# Patient Record
Sex: Female | Born: 1945 | Race: White | Hispanic: No | Marital: Married | Smoking: Former smoker
Health system: Western US, Academic
[De-identification: ages and names within clinical notes are randomized; demographics above are authoritative.]

## PROBLEM LIST (undated history)

## (undated) MED ORDER — GABAPENTIN 300 MG OR CAPS
300.00 mg | ORAL_CAPSULE | Freq: Three times a day (TID) | ORAL | 0 refills | Status: AC
Start: 2019-09-14 — End: ?

## (undated) MED ORDER — ALENDRONATE SODIUM 70 MG OR TABS
70.00 mg | ORAL_TABLET | ORAL | 0 refills | Status: AC
Start: 2019-09-14 — End: ?

## (undated) MED ORDER — GABAPENTIN 300 MG OR CAPS
300.00 mg | ORAL_CAPSULE | Freq: Three times a day (TID) | ORAL | 0 refills | Status: AC
Start: 2021-05-08 — End: ?

## (undated) MED ORDER — GABAPENTIN 300 MG OR CAPS
300.00 mg | ORAL_CAPSULE | Freq: Three times a day (TID) | ORAL | 0 refills | Status: AC
Start: 2021-05-04 — End: ?

## (undated) MED ORDER — ALENDRONATE SODIUM 70 MG OR TABS
ORAL_TABLET | ORAL | 0 refills | Status: AC
Start: 2019-11-23 — End: ?

## (undated) MED ORDER — ALENDRONATE SODIUM 70 MG OR TABS
ORAL_TABLET | ORAL | 0 refills | Status: AC
Start: 2016-10-08 — End: ?

## (undated) MED ORDER — DENOSUMAB 60 MG/ML SC SOSY
60.0000 mg | PREFILLED_SYRINGE | Freq: Once | SUBCUTANEOUS | Status: AC
Start: 2023-02-12 — End: 2023-02-12

## (undated) MED ORDER — GABAPENTIN 300 MG OR CAPS
300.00 mg | ORAL_CAPSULE | Freq: Three times a day (TID) | ORAL | 0 refills | Status: AC
Start: 2021-02-22 — End: ?

## (undated) MED ORDER — ALENDRONATE SODIUM 70 MG OR TABS
ORAL_TABLET | ORAL | 0 refills | Status: AC
Start: 2019-11-26 — End: ?

## (undated) MED ORDER — GABAPENTIN 300 MG OR CAPS
300.0000 mg | ORAL_CAPSULE | Freq: Three times a day (TID) | ORAL | 0 refills | Status: AC
Start: 2021-07-27 — End: ?

## (undated) MED ORDER — ALENDRONATE SODIUM 70 MG OR TABS
ORAL_TABLET | ORAL | 0 refills | Status: AC
Start: 2019-05-04 — End: ?

## (undated) MED ORDER — VITAMIN B-12 1000 MCG OR TABS
1000.00 ug | ORAL_TABLET | Freq: Every day | ORAL | 0 refills | Status: AC
Start: 2019-07-13 — End: ?

## (undated) MED ORDER — GABAPENTIN 300 MG OR CAPS
ORAL_CAPSULE | ORAL | 0 refills | Status: AC
Start: 2020-01-06 — End: ?

## (undated) MED ORDER — ALENDRONATE SODIUM 70 MG OR TABS
70.0000 mg | ORAL_TABLET | ORAL | 1 refills | Status: AC
Start: 2020-07-28 — End: ?

## (undated) MED ORDER — ALENDRONATE SODIUM 70 MG OR TABS
70.0000 mg | ORAL_TABLET | ORAL | 0 refills | Status: AC
Start: 2020-07-28 — End: ?

## (undated) MED ORDER — ALENDRONATE SODIUM 70 MG OR TABS
ORAL_TABLET | ORAL | 0 refills | Status: AC
Start: 2019-11-30 — End: ?

## (undated) MED ORDER — VITAMIN B-12 1000 MCG OR TABS
1000.00 ug | ORAL_TABLET | Freq: Every day | ORAL | 0 refills | Status: AC
Start: 2019-07-06 — End: ?

---

## 2008-10-11 ENCOUNTER — Ambulatory Visit: Payer: Self-pay | Admitting: Gynecology

## 2008-11-03 ENCOUNTER — Ambulatory Visit: Payer: Self-pay | Admitting: Gynecology

## 2008-11-11 LAB — PATHOLOGY TISSUE EXAM - ~~LOC~~

## 2008-11-11 LAB — HISTORICAL HISTOLOGY DATA

## 2008-11-22 ENCOUNTER — Ambulatory Visit: Payer: Self-pay | Admitting: Gynecology

## 2008-12-20 ENCOUNTER — Ambulatory Visit: Payer: Self-pay | Admitting: Gynecology

## 2014-02-03 ENCOUNTER — Ambulatory Visit: Payer: Self-pay

## 2014-02-09 ENCOUNTER — Ambulatory Visit: Payer: Self-pay

## 2014-02-11 ENCOUNTER — Ambulatory Visit: Payer: Self-pay

## 2014-02-12 ENCOUNTER — Ambulatory Visit: Payer: Self-pay

## 2014-02-15 ENCOUNTER — Ambulatory Visit: Payer: Self-pay

## 2014-02-16 ENCOUNTER — Ambulatory Visit: Payer: Self-pay

## 2014-02-17 ENCOUNTER — Ambulatory Visit: Payer: Self-pay

## 2014-02-24 ENCOUNTER — Ambulatory Visit: Payer: Self-pay

## 2014-03-03 ENCOUNTER — Ambulatory Visit: Payer: Self-pay

## 2014-03-08 ENCOUNTER — Ambulatory Visit: Payer: Self-pay

## 2014-03-16 ENCOUNTER — Ambulatory Visit: Payer: Self-pay

## 2014-03-29 ENCOUNTER — Ambulatory Visit: Payer: Self-pay

## 2014-03-31 ENCOUNTER — Ambulatory Visit: Payer: Self-pay

## 2014-04-14 ENCOUNTER — Ambulatory Visit: Payer: Self-pay

## 2014-04-21 ENCOUNTER — Ambulatory Visit: Payer: Self-pay

## 2014-05-12 ENCOUNTER — Ambulatory Visit: Payer: Self-pay

## 2014-06-02 ENCOUNTER — Ambulatory Visit: Payer: Self-pay

## 2014-07-14 ENCOUNTER — Ambulatory Visit: Payer: Self-pay

## 2014-08-04 ENCOUNTER — Ambulatory Visit: Payer: Self-pay | Admitting: Hematology & Oncology

## 2014-08-25 ENCOUNTER — Ambulatory Visit: Payer: Self-pay | Admitting: Hematology & Oncology

## 2014-09-02 ENCOUNTER — Ambulatory Visit: Payer: Self-pay | Admitting: Hematology & Oncology

## 2014-09-02 LAB — CBC WITH DIFF, BLOOD
Basophil: 0 10*3/uL (ref 0–0.2)
Eosinophil: 0 10*3/uL (ref 0–0.5)
Hematocrit: 33.4 % — ABNORMAL LOW (ref 34.0–44.0)
Hgb: 10.7 G/DL — ABNORMAL LOW (ref 11.5–15.0)
Lymphocyte: 1.8 10*3/uL (ref 0.9–3.3)
MCH: 30.1 PG (ref 27.0–33.5)
MCHC: 32 G/DL (ref 32.0–35.5)
MCV: 93.8 FL (ref 81.5–97.0)
Monocyte: 0.5 10*3/uL (ref 0–0.8)
Neutrophils: 4.4 10*3/uL (ref 2.0–8.1)
PLT Count: 204 10*3/uL (ref 150–400)
RBC: 3.56 10*6/uL — ABNORMAL LOW (ref 3.70–5.00)
RDW-CV: 13.1 % (ref 11.6–14.4)
White Bld Cell Count: 6.7 10*3/uL (ref 4.0–10.5)

## 2014-09-02 LAB — COMPREHENSIVE METABOLIC PANEL, BLOOD
ALT: 11 U/L (ref 7–52)
AST: 16 U/L (ref 13–39)
Albumin: 4 G/DL (ref 3.7–5.3)
Alk Phos: 94 U/L (ref 34–104)
BUN: 18 mg/dL (ref 7–25)
Bilirubin, Total: 1.1 mg/dL — ABNORMAL HIGH (ref 0.3–1.0)
CO2: 21 mmol/L (ref 21–31)
Calcium: 9.2 mg/dL (ref 8.6–10.3)
Chloride: 108 mmol/L — ABNORMAL HIGH (ref 98–107)
Creat: 1.1 mg/dL (ref 0.6–1.2)
Electrolyte Balance: 6 mmol/L (ref 2–12)
Glucose: 145 mg/dL — ABNORMAL HIGH (ref 85–125)
Potassium: 4.5 mmol/L (ref 3.5–5.1)
Protein, Total: 6.7 G/DL (ref 6.0–8.3)
Sodium: 135 mmol/L — ABNORMAL LOW (ref 136–145)

## 2014-09-02 LAB — LABCUM (HISTORIC)

## 2014-09-02 LAB — MAGNESIUM, BLOOD: Magnesium: 1.6 mg/dL — ABNORMAL LOW (ref 1.9–2.7)

## 2014-09-02 LAB — PHOSPHORUS, BLOOD: Phosphorus: 3.1 MG/DL (ref 2.5–5.0)

## 2014-09-06 LAB — VITAMIN D, 25-OH TOTAL: Vitamin D, 25-OH, Total: 36 ng/mL (ref 30–100)

## 2014-09-06 LAB — ~~LOC~~ 27.29, BLOOD: ~~LOC~~ 27.29: 16 U/mL (ref <38)

## 2014-10-18 ENCOUNTER — Ambulatory Visit: Payer: Self-pay | Admitting: Hematology & Oncology

## 2014-10-30 LAB — URINALYSIS WITH CULTURE REFLEX, WHEN INDICATED
Bilrubin: NEGATIVE
Glucose: NEGATIVE
Hyaline Casts: NONE SEEN /LPF
Ketones: NEGATIVE
Nitrite: NEGATIVE
Protein, Urine: NEGATIVE
Specific Gravity: 1.006 (ref 1.001–1.035)
Squam EpithelialCells: NONE SEEN /HPF (ref <=5)
WBC: >=60 /HPF — AB (ref <=5)
pH: 5 (ref 5.0–8.0)

## 2014-10-30 LAB — REFLEXIVE URINE CULTURE-QUEST

## 2014-10-30 LAB — URINE CULTURE

## 2014-12-17 ENCOUNTER — Ambulatory Visit: Payer: Self-pay | Admitting: Hematology & Oncology

## 2014-12-17 LAB — COMPREHENSIVE METABOLIC PANEL, BLOOD
ALT: 13 U/L (ref 7–52)
AST: 15 U/L (ref 13–39)
Albumin: 3.9 G/DL (ref 3.7–5.3)
Alk Phos: 114 U/L — ABNORMAL HIGH (ref 34–104)
BUN: 21 mg/dL (ref 7–25)
Bilirubin, Total: 0.7 mg/dL (ref 0.3–1.0)
CO2: 21 mmol/L (ref 21–31)
Calcium: 9 mg/dL (ref 8.6–10.3)
Chloride: 107 mmol/L (ref 98–107)
Creat: 1.3 mg/dL — ABNORMAL HIGH (ref 0.6–1.2)
Electrolyte Balance: 7 mmol/L (ref 2–12)
Glucose: 143 mg/dL — ABNORMAL HIGH (ref 85–125)
Potassium: 5.5 mmol/L — ABNORMAL HIGH (ref 3.5–5.1)
Protein, Total: 6.5 G/DL (ref 6.0–8.3)
Sodium: 135 mmol/L — ABNORMAL LOW (ref 136–145)

## 2014-12-17 LAB — CBC WITH DIFF, BLOOD
Basophil: 0 10*3/uL (ref 0–0.2)
Eosinophil: 0 10*3/uL (ref 0–0.5)
Hematocrit: 29.8 % — ABNORMAL LOW (ref 34.0–44.0)
Hgb: 9.2 G/DL — ABNORMAL LOW (ref 11.5–15.0)
Lymphocyte: 1.6 10*3/uL (ref 0.9–3.3)
MCH: 30 PG (ref 27.0–33.5)
MCHC: 30.9 G/DL — ABNORMAL LOW (ref 32.0–35.5)
MCV: 97.1 FL — ABNORMAL HIGH (ref 81.5–97.0)
Monocyte: 0.4 10*3/uL (ref 0–0.8)
Neutrophils: 5.1 10*3/uL (ref 2.0–8.1)
PLT Count: 239 10*3/uL (ref 150–400)
RBC: 3.07 10*6/uL — ABNORMAL LOW (ref 3.70–5.00)
RDW-CV: 12.1 % (ref 11.6–14.4)
White Bld Cell Count: 7.1 10*3/uL (ref 4.0–10.5)

## 2014-12-17 LAB — LABCUM (HISTORIC)

## 2014-12-20 LAB — VITAMIN D, 25-OH TOTAL: Vitamin D, 25-OH, Total: 37 ng/mL (ref 30–100)

## 2014-12-20 LAB — ~~LOC~~ 27.29, BLOOD: ~~LOC~~ 27.29: 13 U/mL (ref <38)

## 2014-12-29 ENCOUNTER — Ambulatory Visit: Payer: Self-pay | Admitting: Hematology & Oncology

## 2015-02-04 ENCOUNTER — Ambulatory Visit: Payer: Self-pay | Admitting: Hematology & Oncology

## 2015-05-23 LAB — COMPREHENSIVE METABOLIC PANEL, BLOOD
ALT (SGPT): 10 U/L (ref 6–29)
AST (SGOT): 12 U/L (ref 10–35)
Albumin/Glob Ratio: 1.4 (calc) (ref 1.0–2.5)
Albumin: 3.7 g/dL (ref 3.6–5.1)
Alkaline Phos: 137 U/L — ABNORMAL HIGH (ref 33–130)
BUN/Creatinine Ratio: 18 (calc) (ref 6–22)
BUN: 22 mg/dL (ref 7–25)
Bilirubin, Total: 0.8 mg/dL (ref 0.2–1.2)
Calcium: 9 mg/dL (ref 8.6–10.4)
Carbon Dioxide: 22 mmol/L (ref 19–30)
Chloride: 108 mmol/L (ref 98–110)
Creatinine: 1.19 mg/dL — ABNORMAL HIGH (ref 0.60–0.93)
Globulin: 2.6 g/dL (calc) (ref 1.9–3.7)
Glucose: 156 mg/dL — ABNORMAL HIGH (ref 65–99)
Potassium: 5 mmol/L (ref 3.5–5.3)
Sodium: 140 mmol/L (ref 135–146)
Total Protein: 6.3 g/dL (ref 6.1–8.1)
eGFR African American: 53 mL/min/{1.73_m2} — ABNORMAL LOW (ref >=60)
eGFR non-Afr.American: 46 mL/min/{1.73_m2} — ABNORMAL LOW (ref >=60)

## 2015-05-23 LAB — DIFFERENTIAL, MANUAL -QUEST
Abs Basophils: 0 cells/uL (ref 0–200)
Abs Eosinophils: 53 cells/uL (ref 15–500)
Abs Monocytes: 371 cells/uL (ref 200–950)
Abs Neutrophils: 2915 cells/uL (ref 1500–7800)
Absolute Lymphocytes: 1961 cells/uL (ref 850–3900)
Basophils: 0 %
Eosinophils: 1 %
Lymps: 37 %
Monocytes: 7 %
SEGS: 55 %

## 2015-05-23 LAB — CBC+DIFF/PLT W/SMEAR REVIEW-QUEST AND LABCORP
HCT: 31.4 % — ABNORMAL LOW (ref 35.0–45.0)
HGB: 10.1 g/dL — ABNORMAL LOW (ref 11.7–15.5)
MCH: 28.6 pg (ref 27.0–33.0)
MCHC: 32.1 g/dL (ref 32.0–36.0)
MCV: 89.1 fL (ref 80.0–100.0)
MPV: 7.5 fL (ref 7.5–11.5)
PLT: 224 10*3/uL (ref 140–400)
RBC: 3.53 10*6/uL — ABNORMAL LOW (ref 3.80–5.10)
RDW: 13.7 % (ref 11.0–15.0)
WBC: 5.3 10*3/uL (ref 3.8–10.8)

## 2015-05-23 LAB — ~~LOC~~ 27.29, BLOOD: ~~LOC~~ 27.29: 16 U/mL (ref <38)

## 2015-05-23 LAB — VITAMIN D, 25-OH TOTAL: Vitamin D, 25-OH, Total: 31 ng/mL (ref 30–100)

## 2015-05-24 ENCOUNTER — Ambulatory Visit: Payer: Self-pay | Admitting: Hematology & Oncology

## 2015-08-24 ENCOUNTER — Ambulatory Visit: Payer: Self-pay | Admitting: Hematology & Oncology

## 2015-08-25 LAB — CBC WITH DIFF, BLOOD
Abs Basophils: 12 cells/uL (ref 0–200)
Abs Eosinophils: 30 cells/uL (ref 15–500)
Abs Lymphs: 1741 cells/uL (ref 850–3900)
Abs Monocytes: 319 cells/uL (ref 200–950)
Abs Neutrophils: 3800 cells/uL (ref 1500–7800)
Basophils: 0.2 %
Eosinophils: 0.5 %
HCT: 31.1 % — ABNORMAL LOW (ref 35.0–45.0)
HGB: 9.8 g/dL — ABNORMAL LOW (ref 11.7–15.5)
Lymps: 29.5 %
MCH: 28.2 pg (ref 27.0–33.0)
MCHC: 31.6 g/dL — ABNORMAL LOW (ref 32.0–36.0)
MCV: 88.9 fL (ref 80.0–100.0)
MPV: 7.8 fL (ref 7.5–11.5)
Monocytes: 5.4 %
PLT: 257 10*3/uL (ref 140–400)
RBC: 3.5 10*6/uL — ABNORMAL LOW (ref 3.80–5.10)
RDW: 14.8 % (ref 11.0–15.0)
SEGS: 64.4 %
WBC: 5.9 10*3/uL (ref 3.8–10.8)

## 2015-08-25 LAB — TEST IN QUESTION - NO TEST FOR CONTAINER-QUEST

## 2015-08-25 LAB — COMPREHENSIVE METABOLIC PANEL, BLOOD
ALT (SGPT): 9 U/L (ref 6–29)
AST (SGOT): 16 U/L (ref 10–35)
Albumin/Glob Ratio: 1.5 (calc) (ref 1.0–2.5)
Albumin: 4 g/dL (ref 3.6–5.1)
Alkaline Phos: 136 U/L — ABNORMAL HIGH (ref 33–130)
BUN/Creatinine Ratio: 18 (calc) (ref 6–22)
BUN: 21 mg/dL (ref 7–25)
Bilirubin, Total: 0.9 mg/dL (ref 0.2–1.2)
Calcium: 8.9 mg/dL (ref 8.6–10.4)
Carbon Dioxide: 21 mmol/L (ref 20–31)
Chloride: 108 mmol/L (ref 98–110)
Creatinine: 1.19 mg/dL — ABNORMAL HIGH (ref 0.60–0.93)
Globulin: 2.6 g/dL (calc) (ref 1.9–3.7)
Glucose: 172 mg/dL — ABNORMAL HIGH (ref 65–99)
Potassium: 5.7 mmol/L — ABNORMAL HIGH (ref 3.5–5.3)
Sodium: 139 mmol/L (ref 135–146)
Total Protein: 6.6 g/dL (ref 6.1–8.1)
eGFR African American: 53 mL/min/{1.73_m2} — ABNORMAL LOW (ref >=60)
eGFR non-Afr.American: 46 mL/min/{1.73_m2} — ABNORMAL LOW (ref >=60)

## 2015-08-25 LAB — ~~LOC~~ 27.29, BLOOD: ~~LOC~~ 27.29: 8 U/mL (ref <38)

## 2015-11-19 LAB — COMPREHENSIVE METABOLIC PANEL, BLOOD
ALT (SGPT): 11 U/L (ref 6–29)
AST (SGOT): 15 U/L (ref 10–35)
Albumin/Glob Ratio: 1.6 (calc) (ref 1.0–2.5)
Albumin: 4.1 g/dL (ref 3.6–5.1)
Alkaline Phos: 126 U/L (ref 33–130)
BUN/Creatinine Ratio: 20 (calc) (ref 6–22)
BUN: 23 mg/dL (ref 7–25)
Bilirubin, Total: 0.8 mg/dL (ref 0.2–1.2)
Calcium: 8.7 mg/dL (ref 8.6–10.4)
Carbon Dioxide: 24 mmol/L (ref 20–31)
Chloride: 106 mmol/L (ref 98–110)
Creatinine: 1.16 mg/dL — ABNORMAL HIGH (ref 0.60–0.93)
Globulin: 2.6 g/dL (calc) (ref 1.9–3.7)
Glucose: 148 mg/dL — ABNORMAL HIGH (ref 65–99)
Potassium: 5.1 mmol/L (ref 3.5–5.3)
Sodium: 139 mmol/L (ref 135–146)
Total Protein: 6.7 g/dL (ref 6.1–8.1)
eGFR African American: 55 mL/min/{1.73_m2} — ABNORMAL LOW (ref >=60)
eGFR non-Afr.American: 47 mL/min/{1.73_m2} — ABNORMAL LOW (ref >=60)

## 2015-11-19 LAB — CBC WITH DIFF, BLOOD
Abs Basophils: 13 cells/uL (ref 0–200)
Abs Eosinophils: 32 cells/uL (ref 15–500)
Abs Lymphs: 1856 cells/uL (ref 850–3900)
Abs Monocytes: 403 cells/uL (ref 200–950)
Abs Neutrophils: 4096 cells/uL (ref 1500–7800)
Basophils: 0.2 %
Eosinophils: 0.5 %
HCT: 29 % — ABNORMAL LOW (ref 35.0–45.0)
HGB: 9.6 g/dL — ABNORMAL LOW (ref 11.7–15.5)
Lymps: 29 %
MCH: 28.4 pg (ref 27.0–33.0)
MCHC: 32.9 g/dL (ref 32.0–36.0)
MCV: 86.3 fL (ref 80.0–100.0)
MPV: 7.5 fL (ref 7.5–12.5)
Monocytes: 6.3 %
PLT: 224 10*3/uL (ref 140–400)
RBC: 3.36 10*6/uL — ABNORMAL LOW (ref 3.80–5.10)
RDW: 13.8 % (ref 11.0–15.0)
SEGS: 64 %
WBC: 6.4 10*3/uL (ref 3.8–10.8)

## 2015-11-19 LAB — ~~LOC~~ 27.29, BLOOD: ~~LOC~~ 27.29: 21 U/mL (ref <38)

## 2015-11-23 ENCOUNTER — Ambulatory Visit: Payer: Self-pay | Admitting: Hematology & Oncology

## 2016-02-18 LAB — VITAMIN B12, BLOOD: Vitamin B12: 191 pg/mL — ABNORMAL LOW (ref 200–1100)

## 2016-02-18 LAB — COMPREHENSIVE METABOLIC PANEL, BLOOD
ALT (SGPT): 15 U/L (ref 6–29)
AST (SGOT): 18 U/L (ref 10–35)
Albumin/Glob Ratio: 1.4 (calc) (ref 1.0–2.5)
Albumin: 3.9 g/dL (ref 3.6–5.1)
Alkaline Phos: 98 U/L (ref 33–130)
BUN/Creatinine Ratio: 20 (calc) (ref 6–22)
BUN: 30 mg/dL — ABNORMAL HIGH (ref 7–25)
Bilirubin, Total: 0.7 mg/dL (ref 0.2–1.2)
Calcium: 8.6 mg/dL (ref 8.6–10.4)
Carbon Dioxide: 25 mmol/L (ref 20–31)
Chloride: 104 mmol/L (ref 98–110)
Creatinine: 1.48 mg/dL — ABNORMAL HIGH (ref 0.60–0.93)
Globulin: 2.7 g/dL (calc) (ref 1.9–3.7)
Glucose: 166 mg/dL — ABNORMAL HIGH (ref 65–99)
Potassium: 4.9 mmol/L (ref 3.5–5.3)
Sodium: 138 mmol/L (ref 135–146)
Total Protein: 6.6 g/dL (ref 6.1–8.1)
eGFR African American: 41 mL/min/{1.73_m2} — ABNORMAL LOW (ref >=60)
eGFR non-Afr.American: 35 mL/min/{1.73_m2} — ABNORMAL LOW (ref >=60)

## 2016-02-18 LAB — RETICULOCYTES AUTOMATED, BLOOD
Retic %, Auto: 1.2 %
Retic Absolute: 39240 cells/uL (ref 20000–80000)

## 2016-02-18 LAB — CBC WITH DIFF, BLOOD
Abs Basophils: 10 cells/uL (ref 0–200)
Abs Eosinophils: 52 cells/uL (ref 15–500)
Abs Lymphs: 1648 cells/uL (ref 850–3900)
Abs Monocytes: 385 cells/uL (ref 200–950)
Abs NRBC: 0 cells/uL
Abs Neutrophils: 3104 cells/uL (ref 1500–7800)
Basophils: 0.2 %
Eosinophils: 1 %
HCT: 28.1 % — ABNORMAL LOW (ref 35.0–45.0)
HGB: 9.1 g/dL — ABNORMAL LOW (ref 11.7–15.5)
Lymps: 31.7 %
MCH: 27.8 pg (ref 27.0–33.0)
MCHC: 32.4 g/dL (ref 32.0–36.0)
MCV: 85.9 fL (ref 80.0–100.0)
MPV: 9.2 fL (ref 7.5–12.5)
Monocytes: 7.4 %
PLT: 218 10*3/uL (ref 140–400)
RBC: 3.27 10*6/uL — ABNORMAL LOW (ref 3.80–5.10)
RDW: 13.2 % (ref 11.0–15.0)
SEGS: 59.7 %
WBC: 5.2 10*3/uL (ref 3.8–10.8)

## 2016-02-18 LAB — FERRITIN, BLOOD: Ferritin: 7 ng/mL — ABNORMAL LOW (ref 20–288)

## 2016-02-18 LAB — IRON/IBC PANEL
IIBC: 470 mcg/dL (calc) — ABNORMAL HIGH (ref 250–450)
Iron Saturation: 6 % (calc) — ABNORMAL LOW (ref 11–50)
Iron: 30 ug/dL — ABNORMAL LOW (ref 45–160)

## 2016-02-18 LAB — VITAMIN D, 25-OH TOTAL: Vitamin D, 25-OH, Total: 32 ng/mL (ref 30–100)

## 2016-02-18 LAB — ~~LOC~~ 27.29, BLOOD: ~~LOC~~ 27.29: 18 U/mL (ref <38)

## 2016-02-22 ENCOUNTER — Ambulatory Visit: Payer: Self-pay | Admitting: Specialist

## 2016-02-22 ENCOUNTER — Ambulatory Visit: Payer: Self-pay | Admitting: Hematology & Oncology

## 2016-02-29 ENCOUNTER — Ambulatory Visit: Payer: Self-pay | Admitting: Hematology & Oncology

## 2016-03-07 ENCOUNTER — Ambulatory Visit: Payer: Self-pay | Admitting: Hematology & Oncology

## 2016-03-14 ENCOUNTER — Ambulatory Visit: Payer: Self-pay | Admitting: Hematology & Oncology

## 2016-06-15 LAB — CBC WITH DIFF, BLOOD
Abs Basophils: 20 cells/uL (ref 0–200)
Abs Eosinophils: 231 cells/uL (ref 15–500)
Abs Lymphs: 2171 cells/uL (ref 850–3900)
Abs Monocytes: 548 cells/uL (ref 200–950)
Abs NRBC: 0 cells/uL
Abs Neutrophils: 3630 cells/uL (ref 1500–7800)
Basophils: 0.3 %
Eosinophils: 3.5 %
HCT: 32.4 % — ABNORMAL LOW (ref 35.0–45.0)
HGB: 10.5 g/dL — ABNORMAL LOW (ref 11.7–15.5)
Lymps: 32.9 %
MCH: 28.3 pg (ref 27.0–33.0)
MCHC: 32.4 g/dL (ref 32.0–36.0)
MCV: 87.3 fL (ref 80.0–100.0)
MPV: 9.4 fL (ref 7.5–12.5)
Monocytes: 8.3 %
PLT: 201 10*3/uL (ref 140–400)
RBC: 3.71 10*6/uL — ABNORMAL LOW (ref 3.80–5.10)
RDW: 14.4 % (ref 11.0–15.0)
SEGS: 55 %
WBC: 6.6 10*3/uL (ref 3.8–10.8)

## 2016-06-15 LAB — COMPREHENSIVE METABOLIC PANEL, BLOOD
ALT (SGPT): 8 U/L (ref 6–29)
AST (SGOT): 12 U/L (ref 10–35)
Albumin/Glob Ratio: 1.5 (calc) (ref 1.0–2.5)
Albumin: 4.1 g/dL (ref 3.6–5.1)
Alkaline Phos: 116 U/L (ref 33–130)
BUN/Creatinine Ratio: 19 (calc) (ref 6–22)
BUN: 29 mg/dL — ABNORMAL HIGH (ref 7–25)
Bilirubin, Total: 0.6 mg/dL (ref 0.2–1.2)
Calcium: 8.8 mg/dL (ref 8.6–10.4)
Carbon Dioxide: 25 mmol/L (ref 20–31)
Chloride: 101 mmol/L (ref 98–110)
Creatinine: 1.53 mg/dL — ABNORMAL HIGH (ref 0.60–0.93)
Globulin: 2.8 g/dL (calc) (ref 1.9–3.7)
Glucose: 226 mg/dL — ABNORMAL HIGH (ref 65–99)
Potassium: 4.7 mmol/L (ref 3.5–5.3)
Sodium: 137 mmol/L (ref 135–146)
Total Protein: 6.9 g/dL (ref 6.1–8.1)
eGFR African American: 39 mL/min/{1.73_m2} — ABNORMAL LOW (ref >=60)
eGFR non-Afr.American: 34 mL/min/{1.73_m2} — ABNORMAL LOW (ref >=60)

## 2016-06-15 LAB — FERRITIN, BLOOD: Ferritin: 11 ng/mL — ABNORMAL LOW (ref 20–288)

## 2016-06-15 LAB — VITAMIN D, 25-OH TOTAL: Vitamin D, 25-OH, Total: 26 ng/mL — ABNORMAL LOW (ref 30–100)

## 2016-06-15 LAB — VITAMIN B12, BLOOD: Vitamin B12: 677 pg/mL (ref 200–1100)

## 2016-06-15 LAB — IRON/IBC PANEL
IIBC: 422 mcg/dL (calc) (ref 250–450)
Iron Saturation: 14 % (calc) (ref 11–50)
Iron: 60 ug/dL (ref 45–160)

## 2016-06-15 LAB — ~~LOC~~ 27.29, BLOOD: ~~LOC~~ 27.29: 19 U/mL (ref <38)

## 2016-06-22 ENCOUNTER — Ambulatory Visit: Payer: Self-pay | Admitting: Hematology & Oncology

## 2016-08-31 ENCOUNTER — Other Ambulatory Visit: Payer: Self-pay | Admitting: Hematology & Oncology

## 2016-08-31 DIAGNOSIS — Z171 Estrogen receptor negative status [ER-]: Secondary | ICD-10-CM

## 2016-09-03 MED ORDER — ALENDRONATE SODIUM 70 MG OR TABS
ORAL_TABLET | ORAL | 0 refills | Status: DC
Start: 2016-09-03 — End: 2016-10-18

## 2016-10-06 ENCOUNTER — Other Ambulatory Visit: Payer: Self-pay | Admitting: Hematology & Oncology

## 2016-10-08 ENCOUNTER — Other Ambulatory Visit: Payer: Self-pay | Admitting: Nurse Practitioner

## 2016-10-08 DIAGNOSIS — C50919 Malignant neoplasm of unspecified site of unspecified female breast: Secondary | ICD-10-CM

## 2016-10-08 MED ORDER — VITAMIN B-12 1000 MCG OR TABS
ORAL_TABLET | ORAL | 0 refills | Status: DC
Start: 2016-10-08 — End: 2016-10-16

## 2016-10-16 ENCOUNTER — Other Ambulatory Visit: Payer: Self-pay | Admitting: Nurse Practitioner

## 2016-10-16 DIAGNOSIS — E538 Deficiency of other specified B group vitamins: Secondary | ICD-10-CM

## 2016-10-18 ENCOUNTER — Other Ambulatory Visit: Payer: Self-pay

## 2016-10-18 DIAGNOSIS — M810 Age-related osteoporosis without current pathological fracture: Secondary | ICD-10-CM

## 2016-10-18 DIAGNOSIS — I82629 Acute embolism and thrombosis of deep veins of unspecified upper extremity: Secondary | ICD-10-CM | POA: Insufficient documentation

## 2016-10-18 DIAGNOSIS — N183 Chronic kidney disease, stage 3 unspecified (CMS-HCC): Secondary | ICD-10-CM | POA: Insufficient documentation

## 2016-10-18 DIAGNOSIS — E1140 Type 2 diabetes mellitus with diabetic neuropathy, unspecified: Secondary | ICD-10-CM | POA: Insufficient documentation

## 2016-10-18 DIAGNOSIS — D509 Iron deficiency anemia, unspecified: Secondary | ICD-10-CM | POA: Insufficient documentation

## 2016-10-18 DIAGNOSIS — C439 Malignant melanoma of skin, unspecified: Secondary | ICD-10-CM | POA: Insufficient documentation

## 2016-10-18 DIAGNOSIS — Z171 Estrogen receptor negative status [ER-]: Secondary | ICD-10-CM

## 2016-10-18 DIAGNOSIS — E538 Deficiency of other specified B group vitamins: Secondary | ICD-10-CM | POA: Insufficient documentation

## 2016-10-18 DIAGNOSIS — R931 Abnormal findings on diagnostic imaging of heart and coronary circulation: Secondary | ICD-10-CM | POA: Insufficient documentation

## 2016-10-18 DIAGNOSIS — E669 Obesity, unspecified: Secondary | ICD-10-CM | POA: Insufficient documentation

## 2016-10-18 DIAGNOSIS — E119 Type 2 diabetes mellitus without complications: Secondary | ICD-10-CM | POA: Insufficient documentation

## 2016-10-18 HISTORY — DX: Age-related osteoporosis without current pathological fracture: M81.0

## 2016-10-18 MED ORDER — SIMVASTATIN 10 MG OR TABS
ORAL_TABLET | ORAL | Status: AC
Start: 2016-09-18 — End: ?

## 2016-10-18 MED ORDER — VENLAFAXINE HCL 75 MG OR CP24
ORAL_CAPSULE | ORAL | Status: AC
Start: 2016-09-29 — End: ?

## 2016-10-18 MED ORDER — SYNTHROID 100 MCG OR TABS
ORAL_TABLET | ORAL | Status: DC
Start: 2016-09-02 — End: 2018-06-17

## 2016-10-18 MED ORDER — TRULICITY 0.75 MG/0.5ML SC SOPN
PEN_INJECTOR | SUBCUTANEOUS | Status: DC
Start: 2016-10-11 — End: 2019-06-10

## 2016-10-18 MED ORDER — ALENDRONATE SODIUM 70 MG OR TABS
70.0000 mg | ORAL_TABLET | ORAL | 1 refills | Status: DC
Start: 2016-10-18 — End: 2016-12-13

## 2016-10-18 MED ORDER — INVOKANA 100 MG PO TABS
ORAL_TABLET | ORAL | Status: DC
Start: 2016-08-08 — End: 2017-06-04

## 2016-10-18 MED ORDER — METFORMIN HCL 1000 MG OR TABS
ORAL_TABLET | ORAL | Status: DC
Start: 2016-10-08 — End: 2017-12-03

## 2016-10-18 MED ORDER — CALCIUM CARBONATE ANTACID 1000 MG OR CHEW: 100.00 mg | CHEWABLE_TABLET | ORAL | Status: AC

## 2016-10-18 MED ORDER — LISINOPRIL 10 MG OR TABS
5.00 mg | ORAL_TABLET | Freq: Every day | ORAL | Status: DC
Start: 2016-09-21 — End: 2020-12-20

## 2016-10-18 NOTE — Telephone Encounter (Signed)
Received fax from [facility] requesting refill on Fosamax. Rx prompted and forwarded to Dr. Dorris Singh.

## 2016-10-25 MED ORDER — VITAMIN B-12 1000 MCG OR TABS
ORAL_TABLET | ORAL | 0 refills | Status: DC
Start: 2016-10-25 — End: 2016-12-16

## 2016-11-12 ENCOUNTER — Other Ambulatory Visit: Payer: Self-pay | Admitting: Nurse Practitioner

## 2016-11-12 DIAGNOSIS — D519 Vitamin B12 deficiency anemia, unspecified: Secondary | ICD-10-CM

## 2016-11-12 MED ORDER — VITAMIN B-12 1000 MCG OR TABS
ORAL_TABLET | ORAL | 0 refills | Status: DC
Start: 2016-11-12 — End: 2017-01-13

## 2016-11-23 ENCOUNTER — Ambulatory Visit: Payer: Medicare Other | Admitting: Hematology & Oncology

## 2016-11-23 ENCOUNTER — Ambulatory Visit: Payer: Self-pay | Admitting: Hematology & Oncology

## 2016-12-04 ENCOUNTER — Ambulatory Visit (INDEPENDENT_AMBULATORY_CARE_PROVIDER_SITE_OTHER): Payer: Medicare Other | Admitting: Hematology & Oncology

## 2016-12-04 ENCOUNTER — Encounter: Payer: Self-pay | Admitting: Hematology & Oncology

## 2016-12-04 VITALS — BP 120/74 | HR 98 | Temp 97.8°F | Resp 16 | Ht 65.0 in | Wt 203.9 lb

## 2016-12-04 DIAGNOSIS — C4361 Malignant melanoma of right upper limb, including shoulder: Secondary | ICD-10-CM

## 2016-12-04 DIAGNOSIS — D508 Other iron deficiency anemias: Secondary | ICD-10-CM

## 2016-12-04 DIAGNOSIS — N183 Chronic kidney disease, stage 3 unspecified (CMS-HCC): Secondary | ICD-10-CM

## 2016-12-04 DIAGNOSIS — Z171 Estrogen receptor negative status [ER-]: Secondary | ICD-10-CM

## 2016-12-04 DIAGNOSIS — Z923 Personal history of irradiation: Secondary | ICD-10-CM

## 2016-12-04 DIAGNOSIS — C50911 Malignant neoplasm of unspecified site of right female breast: Principal | ICD-10-CM

## 2016-12-04 DIAGNOSIS — Z780 Asymptomatic menopausal state: Secondary | ICD-10-CM

## 2016-12-04 DIAGNOSIS — E538 Deficiency of other specified B group vitamins: Secondary | ICD-10-CM

## 2016-12-04 DIAGNOSIS — M8589 Other specified disorders of bone density and structure, multiple sites: Secondary | ICD-10-CM

## 2016-12-04 DIAGNOSIS — M810 Age-related osteoporosis without current pathological fracture: Secondary | ICD-10-CM

## 2016-12-04 MED ORDER — IRON PO
ORAL | Status: DC
Start: ? — End: 2022-06-11

## 2016-12-04 MED ORDER — VITAMIN D-3 PO: Freq: Every day | ORAL | Status: AC

## 2016-12-04 NOTE — Patient Instructions (Signed)
Bonnita Hollow, MD    Specialty  DDermatology    [address] [place] [address]     Phone:   [phone]   Fax:   [phone]

## 2016-12-04 NOTE — Progress Notes (Signed)
Kayla Faster, MD, MBA    Associate Clinical Professor  [facility]  [place]  [address], [address], [place], [place] [address]  Tel.: [phone] Fax: [phone]      Date of Service:  December 04, 2016    Kayla Joseph, Kayla Joseph December 31, 1945    REFERRING MD: Kayla Joseph  Radiation oncology: Kayla Joseph  PCP: Kayla Joseph  GYN: Kayla Joseph   Cardiology: Kayla Joseph    CHIEF COMPLAINT: Routine follow-up visit for right breast cancer and melanoma    HISTORY OF PRESENT ILLNESS:    71 y/o Caucasian woman with mammographically detected clinical stage IIA T2N0 right breast invasive ductal carcinoma s/p bx 01/14/14 (SBR 9/9, ER/PR negative, Ki-67=50%, Her-2/neu FISH ratio=1.14 but copy number=6.6 so positive). MRI breast 01/29/14 showed mass to be 2.2 x 1.6 x 2.0 cm. Staging PET/CT 01/27/14 was negative for distant mets; showed only known right breast tumor and incidental left thyroid nodule. Planned neoadjuvant systemic therapy: TCH + P x 6 cycles followed by surgery and radiation therapy. Cycle #1 TCH+P given 02/17/14. Pt developed severe diarrhea and dehydration causing electrolyte derangements requiring daily replacement therapies and hydration. Therefore, chemotherapy was changed to weekly Taxol for 12 weeks and Herceptin plus Perjeta every 3 weeks was continued. Taxol started 03/10/14 and completed only 8 weeks on 04/28/14 due to cumulative severe side effects and MRI breast 04/30/14 showing only 5m of residual enhancement. Herceptin/Perjeta continued q3wks for 6 total cycles. Right lumpectomy + SLNB done 05/28/14 (6 mm residual IDC, grade 2, SBR 7/9, no LI, 0/1 LN; ypT1bN0; residual tumor had lower a mitotic score compared to initial biopsy specimen likely due to chemotherapy effect; repeat ER and PR on residual tumor was again negative for both). Due to residual disease although minimal, pt received final dose (#6) of Perjeta  06/02/14. Herceptin single-agent q3wks started 06/23/14.     July 14, 2014 (Herceptin week #22)     Started single-agent Herceptin 06/23/14 and tolerated it well without side effects. Hair is starting to grow. Neuropathy in the fingers and bottoms of the feet continue but improve when she takes gabapentin 300 mg 3 times a day. She does not want to increase the dose since it is controlling her neuropathy and she does not want to increase fatigue due to medication side effect. [place] but it was improved with more solid form during [place]. The only difference during that trip was that her friends were cooking all of her meals for her so a change in diet seems to have improved the diarrhea. Since her return, her diarrhea has resumed unchanged. She has about 6 watery episodes of diarrhea per day and has had 3 episodes thus far this morning. She continues Percocet as needed but also has a prior prescription for more tincture of opium which she will change back to since postop pain has resolved. She has not yet done repeat stool studies since she was on her trip but will do so soon with prior order. She has not seen the gastroenterologist yet but will ask her primary care doctor for a referral to see one soon. Dysuria symptoms recurred this past weekend. Bactrim worked well in the past after the 1st dose. She saw Dr PMarshall Cork9/4/15 and will start xrt next week on 07/21/14. She receives hydration every 3 weeks with Herceptin but requests more frequent visits since her diarrhea is ongoing.  Labs (CBC, CMP) 06/02/14 sig for H/H=10.6/32.9; gluc=221.   The patient completed the Herceptin every 3 weeks for total of 1 year of adjuvant therapy in 12-2014 and remains off therapy.     INTERVAL HISTORY:    The patient reports no new breast lesions. She had a colonoscopy one week ago. She remains on vitamin-D and calcium. She is reporting no pains.  She also reports that she had a melanoma and reports no new skin lesions and need to follow up with dermatology but her dermatologist retired. She denies any bleeding.   She is also followed by cardiology.  She is diabetic and is trying to control it better. She denies any bleeding.     PAST MEDICAL HISTORY:     Reviewed and no changes  Stage IIA T2N0 right breast IDC --> ypT1bN0   LUE DVT dx'ed by U/S 02/10/14 -- Lovenox started same day; resolved on 05/18/14 U/S; completed Lovenox 06/07/14   Stage I pT1aN0 melanoma of upper back s/p excision  DM II  Hyperlipidemia  Hypertension  BCC skin  Thyroid nodules bilaterally       PAST SURGICAL HISTORY:     Reviewed and no changes   05/28/14 Right lumpectomy + SLNB   02/05/14 L. chest port placement ([facility] IR)  07/04/11 Wide excision upper back melanoma + R. supraclavicular SLNB (lentigo melanoma and melanoma in situ, 0.6m, no residual invasive melanoma, 0/1 LN)   2013 Facelift  2012 Gastric bypass  1999 Cholecystectomy  1995 Left foot fracture repair with pins  Age [age]Tonsillectomy     PAST OB/GYN HISTORY:     Reviewed and no changes   G0P0. Menarche at 71 OCP x 15 yrs; last age 71s HRT late 560sfor <73ms; stopped after [facility] study results reported. LMP age 71     HEALTH MAINTENANCE:    Colo -- 3-4 yrs ago; repeat due next year   DXA -- 2015 osteopenia; prescribed Fosamax but never started   Pap -- 11/2013 (one prior abnl pap and polyp removed)      FAMILY HISTORY:    Reviewed and no changes   Mother -- died 854f CHF  Father -- died 8773f decline after stroke  Sister -- died 4922f NHL; another sister alive and well     SOCIAL HISTORY:    Lives with partner, FrQuintella Joseph  Kids -- none   Retired [employer] developer  Tobacco -- only 4 yrs in early 20s, <1ppd  Alcohol  1 glass wine/mth at most   Drugs  none      CURRENT MEDS:   Please see below and I reviewed the medication list with the patient.    ALLERGIES:   No Known Allergies    REVIEW OF SYSTEMS (ROS):  A comprehensive 15-point ROS was performed and reviewed with the patient and is negative unless noted above.     EXAM:    GEN: The patient is well developed and well nourished, ambulatory, in no acute distress.  HEENT: normocephalic/atraumatic, anicteric sclera, there is no evidence for cervical or supraclavicular lymphadenopathy bilaterally.  HEART: S1, S2, regular rate and rhythm.  CHEST: Clear to auscultation bilaterally, good inspiratory effort, no crackles or rales or wheezing, no spine or flank tenderness to palpation.  AXILLAE: No palpable lesions bilaterally.  BREASTS: Were examined bilaterally, no new palpable breast lesions, no nipple discharge bilaterally.  ABDOMEN: soft, non-tender, non-distended, normal bowel sounds, no rebound or guarding, no appreciable  hepatosplenomegaly, no inguinal lymphadenopathy bilaterally.  EXTREMITIES: No edema of the lower extremities.  SKIN: no petechiae or rash, no new skin lesions and complete skin exam was not performed.  NEURO: AOx3, EOMI, no focal deficits, gait steady.  PSYCHIATRIC: Good insight adequate mood and affect.    LAB DATA:    Reviewed the lab work with patient:    11-26-2016 hemoglobin A1c percent, normal comprehensive metabolic panel except glucose 122, creatinine 1.25, normal liver function profile, hemoglobin 11.5, hematocrit 35.4.  B 12  is 559.  10/11/2016 she count 6.1, hemoglobin 10.8, hematocrit 32.7, platelet count of 227000 normal differential, ferritin 10, normal comprehensive metabolic panel except glucose 230, BUN 35, creatinine 1.56, calcium 8.5, hemoglobin A1c 11.1%.  Vitamin D 22 ng/mL.  June 14, 2016 WBC count 6.6, hemoglobin 10.5, hematocrit 32.4, platelet count of 201,000, normal differential, normal comprehensive metabolic panel except glucose 226, BUN 29, creatinine 1.53, normal liver function  profile, vitamin-D 26 ng/mL, ferritin 11, B12 is 677, [place] 27-29 is 19.  02/17/2016 WBC count 5.2, hemoglobin 9.1, hematocrit 28.1, platelet count of 218000, normal differential, normal comprehensive metabolic panel except glucose of 166, BUN 30, creatinine 1.48, normal liver function profile, absolute retic count 39,240. Ferritin 7, B12 is 191. [place] 27-29 is 18. The vitamin-D is 32 ng/mL.  11/18/2015 WBC count 6.4, hemoglobin 9.6, hematocrit 29, platelet count of 224000, normal differential, normal comprehensive metabolic panel except glucose 148, creatinine 1.16, normal liver function profile, [place] 27-29 is 21.  August 03, 2015 creatinine 1.31,TSH 4.54, vitamin-D 23 ng/mL.  May 20, 2015 WBC count 5.3, hemoglobin 10.1, hematocrit 31.4, platelets of 224000 normal diff, [place] 27-29 is 16, Vit D 31 ng/mL normal comprehensive metabolic panel except glucose 156, creatinine 1.19, alk-phos 137.  12/27/2014 vitamin-D 34 ng/ml.  12/23/2014 normal basic metabolic panel except calcium 7.8 and glucose 103.  White Blood Cell Count: 7.0 THOUS/MCL [06-02-14 10:35]  Hemoglobin: 10.6 G/DL [06-02-14 10:35]  Hematocrit: 32.9 % [06-02-14 10:35]  Platelet Count: 290 THOUS/MCL [06-02-14 10:35]  RBC: 3.33 MILL/MCL [06-02-14 10:35]  MCV: 98.8 FL [06-02-14 10:35]  MCH: 31.8 PG [06-02-14 10:35]  MCHC: 32.2 G/DL [06-02-14 10:35]  RDW-CV: 13.6 % [06-02-14 10:35]  Neutrophils: 3.9 THOUS/MCL [06-02-14 10:35]  Lymphocyte: 2.5 THOUS/MCL [06-02-14 10:35]  Monocyte: 0.5 THOUS/MCL [06-02-14 10:35]  Eosinophil: 0.1 THOUS/MCL [06-02-14 10:35]  Basophil: 0.0 THOUS/MCL [06-02-14 10:35]  RBC Morphology: NO RBC ABNORMALITIES DETECTED BY AUTOMATED ANALYSIS. [06-02-14 10:35]  Plt Morph/Comm: DIFFERENTIAL PERFORMED BY AUTOMATED ANALYSIS. NO PLATELET ABNORMALITIES DETECTED BY AUTOMATED ANALYSIS. [06-02-14 10:35]  Sodium: 136 mmol/L [06-02-14 10:35]  Potassium: 4.2 mmol/L [06-02-14 10:35]  Chloride: 107 mmol/L [06-02-14 10:35]  CO2: 21 mmol/L [06-02-14 10:35]   Electrolyte Balance: 8 mmol/L [06-02-14 10:35]  BUN: 12 mg/dL [06-02-14 10:35]  Creatinine: 0.9 mg/dL [06-02-14 10:35]  Glucose: 221 mg/dL [06-02-14 10:35]  Calcium: 9.0 mg/dL [06-02-14 10:35]  Protein, Total: 6.2 G/DL [06-02-14 10:35]  Albumin: 3.7 G/DL [06-02-14 10:35]  Alkaline Phosphatase: 81 U/L [06-02-14 10:35]  Bilirubin, Total: 0.9 mg/dL [06-02-14 10:35]  AST: 22 U/L [06-02-14 10:35]  ALT: 16 U/L [06-02-14 10:35]No data available for the last 5 years    IMAGING DATA:    Reviewed the imaging with patient:    05/24/2016 bilateral diagnostic 2D and 3D mammogram shows stable post lumpectomy changes in the right breast are benign.  December 27, 2015 echocardiogram shows a normal ejection fraction.  05-24-2015 bilateral diagnostic mammogram and bilateral breast ultrasound showed new post lumpectomy scar in the right breast is benign,  stable punctate calcifications in the area areolar region of the right breast are benign, stable punctate calcifications in the left breast at 6 o'clock are benign with no evidence of malignancy.  11/22/2014 bilateral 2D and 3D mammogram shows new punctate calcifications in the periareolar region of the right breast are probably benign in mammographic follow-up in 6 months is recommended, new post lumpectomy scar in the right breast is benign. New punctate calcifications in the left breast at 6 o'clock up probably benign.  11/22/2014 bone density shows osteoporosis with a T-score of-2.6 in the right femoral neck.  August 31, 2014 echocardiogram shows left ventricular diastolic dysfunction, normal left ventricular size and systolic function.    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  09/27/2014 left thyroid nodule atypical follicular lesion of undetermined  significance, a right thyroid nodule fine-needle aspiration negative for malignancy.    IMPRESSION/PLAN:    Problem # 1: BREAST CANCER (ICD-174.9)    Initial clinical stage IIA pT2N0 right IDC s/p bx 01/14/14 (ER/PR negative, Her-2 positive by copy number = 6.6, Ki-67=50%). MRI breast 01/29/14 showed lesion to be 2.2 x 1.6 x 2.0 cm. Staging PET/CT negative for distant mets. Neoadjuvant chemotherapy with Her-2 targeted therapy was suggested, since tumor size > 2cm, so recommended TCH + P (Taxotere, Carboplatin, Herceptin, Perjeta) q3wks x 6 cycles. Systemic therapies started 02/17/14 but pt developed severe diarrhea causing dehydration and electrolyte abnormalities requiring daily hydration and electrolyte replacements so changed regimen to weekly Taxol x 12 weeks with Herceptin/Perjeta q3wks and tolerated only slightly better. Right breast mass resolved on physical exam. Pt was becoming weaker due to ongoing diarrhea on Taxol so discontinued after 04/28/14 dose with plan for repeat breast imaging followed by surgery if response was good. Breast imaging 04/28/14 (mammo, u/s) and MRI 04/30/14 showed minimal residual disease so Taxol was discontinued after 04/28/14 dose (week #8) and Herceptin/Perjeta continued. Right lumpectomy + SLNB 05/28/14 showed 6 mm residual disease, grade 2, SBR=7/9 (lower mitotic rate than bx), 0/1 LN; repeat ER/PR negative. Since only 5 doses of Perjeta were given pre-operatively and there was residual disease in surgical specimen, she was given the final 6th dose of Perjeta 06/02/14 with usual q3wk dose of Herceptin. Single-agent Herceptin continued q3wk and completed a 1-yr course in 12-2014 and since then no therapy. The radiation therapy was completed. Last mammogram reviewed and negative and next one planned for August of 2018 and an order was given. We discussed again signs and symptoms suggestive of recurrent disease. Clinically no evidence of any recurrent disease and I will continue to  monitor.    Problem # 2: ACUTE DVT OF UPPER EXTREMITY (ICD-453.82)    LUE acute DVT dx'ed by u/s 02/10/14 due to sudden onset of left arm swelling, completed anticoagulation and no evidence of recurrent thrombosis. May consider to remove PORT was concerned about chance to develop DVT but advised that removing the PORT does not usually cause a DVT.    Problem # 3: ABNORMAL ECHO (ICD-793.2)    Baseline ECHO 02/10/14 with normal EF of 65-70% but with mild diastolic dysfunction. I advised her to continue f/u with Dr. Reubin Milan for monitoring of cardiac function and recommended again a repeat ECHO.    Problem # 4: THYROID NODULE (ICD-241)     She had a total thyroidectomy and no malignancy found. Follow-up with  Endocrinology.    PROBLEM # 5 OSTEOPOROSIS    I advised her to discuss this also with her dentist, she will require ongoing dental monitoring, if okay with the dentist she continues on Fosamax at 70 mg weekly, we discussed the risks and benefits of this medication and the dentist is watching for osteonecrosis of the jaw but this is very rare with oral biphosphonate therapy and she reports no problems so far. Remains on Fosamax. TContinue Vit D and next bone density planned for February 2018.    PROBLEM # 6 MELANOMA    Still no pathology report and her old dermatologist retired and now she needs a new dermatologist and so the stage remains unknown, advised her again to follow-up with Dermatology, and also limit excessive sun exposure. She reports no new lesions.     PLAN:    1) Completed single-agent Herceptin q3wks to complete 51yrtotal course (06/23/14 thru 01/19/15). Repeat mammogram and ultrasound was negative that will be repeated yearly and remains off any therapy.Repeat mammogram 05-2017.  2) Anemia due to severe iron and B12 deficiency, was given B12 injections and monitor the iron profile and remains on oral iron. Her H/H is improving.  3) ECHO to be repeated as per Dr. RReubin Milanof cardiology.   4) Thyroidectomy done due to atypical left thyroid nodule and no malignancy, follow-up with Endocrinology.  5) Renal insufficiency and diabetes may contribute to anemia and advised her to stay well hydrated and follow-up with nephrology.  6) History of melanoma and she was referred to Dermatology.     Thank you very much for allowing our continued participation in the care of this patient.     More than 50% of this 40 min visit was spent in educating the patient about their condition, discussing compliance issues, counseling including answering all of the patients questions and coordination of care. This included review of relevant laboratory, radiology and other diagnostic tests, explaining medical management choices and the patient verbalized understanding. All risks, benefits, and alternatives were discussed in detail with the patient and the patient wished to proceed with the suggested treatment plan.     RTO: 4-6 months    I certify that I have reviewed the documentation contained in this clinical record and that it is accurately recorded. This document contains private and confidential health information protected by state and federal law and any release of this information requires the written prior authorization of the above mentioned patient.     Take a virtual tour of our facility: [email or web address]   Clinical trials: [email or web address]       --------------------------------------  ABarbaraann Faster MD, MBA

## 2016-12-13 ENCOUNTER — Other Ambulatory Visit: Payer: Self-pay | Admitting: Hematology & Oncology

## 2016-12-13 DIAGNOSIS — M810 Age-related osteoporosis without current pathological fracture: Secondary | ICD-10-CM

## 2016-12-13 MED ORDER — ALENDRONATE SODIUM 70 MG OR TABS
ORAL_TABLET | ORAL | 0 refills | Status: DC
Start: 2016-12-13 — End: 2017-01-14

## 2016-12-14 ENCOUNTER — Other Ambulatory Visit: Payer: Self-pay | Admitting: Hematology & Oncology

## 2016-12-14 DIAGNOSIS — M810 Age-related osteoporosis without current pathological fracture: Secondary | ICD-10-CM

## 2016-12-14 MED ORDER — ALENDRONATE SODIUM 70 MG OR TABS
ORAL_TABLET | ORAL | 0 refills | Status: DC
Start: 2016-12-14 — End: 2017-06-04

## 2016-12-16 ENCOUNTER — Other Ambulatory Visit: Payer: Self-pay | Admitting: Nurse Practitioner

## 2016-12-16 DIAGNOSIS — E538 Deficiency of other specified B group vitamins: Secondary | ICD-10-CM

## 2016-12-17 MED ORDER — VITAMIN B-12 1000 MCG OR TABS
ORAL_TABLET | ORAL | 0 refills | Status: DC
Start: 2016-12-17 — End: 2017-02-18

## 2016-12-22 ENCOUNTER — Other Ambulatory Visit: Payer: Self-pay | Admitting: Nurse Practitioner

## 2016-12-22 DIAGNOSIS — G629 Polyneuropathy, unspecified: Secondary | ICD-10-CM

## 2016-12-24 MED ORDER — GABAPENTIN 300 MG OR CAPS
ORAL_CAPSULE | ORAL | 2 refills | Status: DC
Start: 2016-12-24 — End: 2017-03-18

## 2017-01-13 ENCOUNTER — Other Ambulatory Visit: Payer: Self-pay | Admitting: Nurse Practitioner

## 2017-01-13 DIAGNOSIS — E538 Deficiency of other specified B group vitamins: Secondary | ICD-10-CM

## 2017-01-14 ENCOUNTER — Other Ambulatory Visit: Payer: Self-pay | Admitting: Hematology & Oncology

## 2017-01-14 DIAGNOSIS — M810 Age-related osteoporosis without current pathological fracture: Secondary | ICD-10-CM

## 2017-01-14 MED ORDER — ALENDRONATE SODIUM 70 MG OR TABS
ORAL_TABLET | ORAL | 0 refills | Status: DC
Start: 2017-01-14 — End: 2017-02-08

## 2017-01-14 MED ORDER — VITAMIN B-12 1000 MCG OR TABS
ORAL_TABLET | ORAL | 0 refills | Status: DC
Start: 2017-01-14 — End: 2017-03-19

## 2017-02-08 ENCOUNTER — Other Ambulatory Visit: Payer: Self-pay | Admitting: Hematology & Oncology

## 2017-02-08 DIAGNOSIS — M810 Age-related osteoporosis without current pathological fracture: Secondary | ICD-10-CM

## 2017-02-11 MED ORDER — ALENDRONATE SODIUM 70 MG OR TABS
ORAL_TABLET | ORAL | 0 refills | Status: DC
Start: 2017-02-11 — End: 2017-03-17

## 2017-02-15 ENCOUNTER — Other Ambulatory Visit: Payer: Self-pay | Admitting: Hematology & Oncology

## 2017-02-15 DIAGNOSIS — M810 Age-related osteoporosis without current pathological fracture: Secondary | ICD-10-CM

## 2017-02-18 ENCOUNTER — Other Ambulatory Visit: Payer: Self-pay | Admitting: Nurse Practitioner

## 2017-02-18 DIAGNOSIS — E538 Deficiency of other specified B group vitamins: Secondary | ICD-10-CM

## 2017-02-18 MED ORDER — ALENDRONATE SODIUM 70 MG OR TABS
ORAL_TABLET | ORAL | 0 refills | Status: DC
Start: 2017-02-18 — End: 2017-04-15

## 2017-02-20 MED ORDER — VITAMIN B-12 1000 MCG OR TABS
ORAL_TABLET | ORAL | 0 refills | Status: DC
Start: 2017-02-20 — End: 2017-06-04

## 2017-03-17 ENCOUNTER — Other Ambulatory Visit: Payer: Self-pay | Admitting: Hematology & Oncology

## 2017-03-17 DIAGNOSIS — M810 Age-related osteoporosis without current pathological fracture: Secondary | ICD-10-CM

## 2017-03-18 ENCOUNTER — Other Ambulatory Visit: Payer: Self-pay | Admitting: Nurse Practitioner

## 2017-03-18 DIAGNOSIS — G629 Polyneuropathy, unspecified: Secondary | ICD-10-CM

## 2017-03-19 ENCOUNTER — Other Ambulatory Visit: Payer: Self-pay | Admitting: Nurse Practitioner

## 2017-03-19 DIAGNOSIS — E538 Deficiency of other specified B group vitamins: Secondary | ICD-10-CM

## 2017-03-19 MED ORDER — ALENDRONATE SODIUM 70 MG OR TABS
ORAL_TABLET | ORAL | 0 refills | Status: DC
Start: 2017-03-19 — End: 2017-05-18

## 2017-03-20 MED ORDER — VITAMIN B-12 1000 MCG OR TABS
ORAL_TABLET | ORAL | 2 refills | Status: DC
Start: 2017-03-20 — End: 2017-07-15

## 2017-03-20 MED ORDER — GABAPENTIN 300 MG OR CAPS
ORAL_CAPSULE | ORAL | 1 refills | Status: DC
Start: 2017-03-20 — End: 2017-05-19

## 2017-04-15 ENCOUNTER — Other Ambulatory Visit: Payer: Self-pay | Admitting: Hematology & Oncology

## 2017-04-15 DIAGNOSIS — M810 Age-related osteoporosis without current pathological fracture: Secondary | ICD-10-CM

## 2017-04-15 MED ORDER — ALENDRONATE SODIUM 70 MG OR TABS
ORAL_TABLET | ORAL | 0 refills | Status: DC
Start: 2017-04-15 — End: 2017-06-04

## 2017-05-18 ENCOUNTER — Other Ambulatory Visit: Payer: Self-pay | Admitting: Hematology & Oncology

## 2017-05-18 DIAGNOSIS — M818 Other osteoporosis without current pathological fracture: Secondary | ICD-10-CM

## 2017-05-19 ENCOUNTER — Other Ambulatory Visit: Payer: Self-pay | Admitting: Nurse Practitioner

## 2017-05-19 DIAGNOSIS — G629 Polyneuropathy, unspecified: Secondary | ICD-10-CM

## 2017-05-20 ENCOUNTER — Encounter: Payer: Self-pay | Admitting: Nurse Practitioner

## 2017-05-20 MED ORDER — GABAPENTIN 300 MG OR CAPS
ORAL_CAPSULE | ORAL | 0 refills | Status: DC
Start: 2017-05-20 — End: 2017-06-15

## 2017-05-20 MED ORDER — ALENDRONATE SODIUM 70 MG OR TABS
ORAL_TABLET | ORAL | 0 refills | Status: DC
Start: 2017-05-20 — End: 2017-07-10

## 2017-05-28 ENCOUNTER — Other Ambulatory Visit: Payer: Self-pay | Admitting: Pulmonary Disease

## 2017-05-29 LAB — CBC WITH DIFF, BLOOD
Abs Basophils: 21 cells/uL (ref 0–200)
Abs Eosinophils: 48 cells/uL (ref 15–500)
Abs Lymphs: 1945 cells/uL (ref 850–3900)
Abs Monocytes: 387 cells/uL (ref 200–950)
Abs NRBC: 0 cells/uL
Abs Neutrophils: 2899 cells/uL (ref 1500–7800)
Basophils: 0.4 %
Eosinophils: 0.9 %
HCT: 33.3 % — ABNORMAL LOW (ref 35.0–45.0)
HGB: 11.5 g/dL — ABNORMAL LOW (ref 11.7–15.5)
Lymps: 36.7 %
MCH: 32.2 pg (ref 27.0–33.0)
MCHC: 34.5 g/dL (ref 32.0–36.0)
MCV: 93.3 fL (ref 80.0–100.0)
MPV: 9.5 fL (ref 7.5–12.5)
Monocytes: 7.3 %
PLT: 179 10*3/uL (ref 140–400)
RBC: 3.57 10*6/uL — ABNORMAL LOW (ref 3.80–5.10)
RDW: 11.9 % (ref 11.0–15.0)
SEGS: 54.7 %
WBC: 5.3 10*3/uL (ref 3.8–10.8)

## 2017-05-29 LAB — COMPREHENSIVE METABOLIC PANEL, BLOOD
ALT (SGPT): 9 U/L (ref 6–29)
AST (SGOT): 13 U/L (ref 10–35)
Albumin/Glob Ratio: 1.6 (calc) (ref 1.0–2.5)
Albumin: 4.1 g/dL (ref 3.6–5.1)
Alkaline Phos: 83 U/L (ref 33–130)
BUN/Creatinine Ratio: 17 (calc) (ref 6–22)
BUN: 21 mg/dL (ref 7–25)
Bilirubin, Total: 1 mg/dL (ref 0.2–1.2)
Calcium: 9.5 mg/dL (ref 8.6–10.4)
Carbon Dioxide: 26 mmol/L (ref 20–32)
Chloride: 107 mmol/L (ref 98–110)
Creatinine: 1.24 mg/dL — ABNORMAL HIGH (ref 0.60–0.93)
Globulin: 2.6 g/dL (calc) (ref 1.9–3.7)
Glucose: 103 mg/dL — ABNORMAL HIGH (ref 65–99)
Potassium: 5.4 mmol/L — ABNORMAL HIGH (ref 3.5–5.3)
Sodium: 138 mmol/L (ref 135–146)
Total Protein: 6.7 g/dL (ref 6.1–8.1)
eGFR African American: 50 mL/min/{1.73_m2} — ABNORMAL LOW (ref >=60)
eGFR non-Afr.American: 43 mL/min/{1.73_m2} — ABNORMAL LOW (ref >=60)

## 2017-05-29 LAB — COPY RECEIVED FROM-QUEST

## 2017-05-29 LAB — VITAMIN B12/FOLATE, SERUM PANEL-QUEST
Folate: 11.8 ng/mL
Vitamin B12: 611 pg/mL (ref 200–1100)

## 2017-05-29 LAB — LIPID(CHOL FRACT) PANEL, BLOOD
Chol/HDLC Ratio: 2.2 (calc) (ref <5.0)
Cholesterol: 112 mg/dL (ref <200)
HDL Cholesterol: 50 mg/dL — ABNORMAL LOW (ref >50)
LDL-Cholesterol: 41 mg/dL (calc)
Non-HDL Cholesterol: 62 mg/dL (calc) (ref <130)
Triglycerides: 129 mg/dL (ref <150)

## 2017-05-29 LAB — FERRITIN, BLOOD: Ferritin: 16 ng/mL — ABNORMAL LOW (ref 20–288)

## 2017-05-29 LAB — IRON/IBC PANEL
IIBC: 393 mcg/dL (calc) (ref 250–450)
Iron Saturation: 29 % (calc) (ref 11–50)
Iron: 115 ug/dL (ref 45–160)

## 2017-05-29 LAB — GLYCOSYLATED HGB(A1C), BLOOD: Hgb A1C: 6 % of total Hgb — ABNORMAL HIGH (ref <5.7)

## 2017-05-29 LAB — ~~LOC~~ 27.29, BLOOD: ~~LOC~~ 27.29: 14 U/mL (ref <38)

## 2017-05-29 LAB — VITAMIN D, 25-OH TOTAL: Vitamin D, 25-OH, Total: 40 ng/mL (ref 30–100)

## 2017-06-04 ENCOUNTER — Ambulatory Visit (INDEPENDENT_AMBULATORY_CARE_PROVIDER_SITE_OTHER): Payer: Medicare Other | Admitting: Hematology & Oncology

## 2017-06-04 ENCOUNTER — Encounter: Payer: Self-pay | Admitting: Hematology & Oncology

## 2017-06-04 VITALS — BP 123/72 | Temp 97.3°F | Resp 16 | Ht 65.0 in | Wt 183.0 lb

## 2017-06-04 DIAGNOSIS — Z171 Estrogen receptor negative status [ER-]: Secondary | ICD-10-CM

## 2017-06-04 DIAGNOSIS — M810 Age-related osteoporosis without current pathological fracture: Secondary | ICD-10-CM

## 2017-06-04 DIAGNOSIS — C50911 Malignant neoplasm of unspecified site of right female breast: Principal | ICD-10-CM

## 2017-06-04 NOTE — Progress Notes (Signed)
[name], MD, MBA    Associate Clinical Professor  [facility] Note  [place]  [address], [address], [place], [place] [address]  Tel.: [phone] Fax: [phone]      Date of Service:  06/04/2017    Kayla Joseph, [name] [birth year]    REFERRING MD: Bonney Leitz  Radiation oncology: Marshall Cork  PCP: [name]  GYN: [name]   Cardiology: Gypsy Decant    CHIEF COMPLAINT: Routine follow-up visit for right breast cancer and melanoma    HISTORY OF PRESENT ILLNESS:    71 y/o Caucasian woman with mammographically detected clinical stage IIA T2N0 right breast invasive ductal carcinoma s/p bx 01/14/14 (SBR 9/9, ER/PR negative, Ki-67=50%, Her-2/neu FISH ratio=1.14 but copy number=6.6 so positive). MRI breast 01/29/14 showed mass to be 2.2 x 1.6 x 2.0 cm. Staging PET/CT 01/27/14 was negative for distant mets; showed only known right breast tumor and incidental left thyroid nodule. Planned neoadjuvant systemic therapy: TCH + P x 6 cycles followed by surgery and radiation therapy. Cycle #1 TCH+P given 02/17/14. Pt developed severe diarrhea and dehydration causing electrolyte derangements requiring daily replacement therapies and hydration. Therefore, chemotherapy was changed to weekly Taxol for 12 weeks and Herceptin plus Perjeta every 3 weeks was continued. Taxol started 03/10/14 and completed only 8 weeks on 04/28/14 due to cumulative severe side effects and MRI breast 04/30/14 showing only 76m of residual enhancement. Herceptin/Perjeta continued q3wks for 6 total cycles. Right lumpectomy + SLNB done 05/28/14 (6 mm residual IDC, grade 2, SBR 7/9, no LI, 0/1 LN; ypT1bN0; residual tumor had lower a mitotic score compared to initial biopsy specimen likely due to chemotherapy effect; repeat ER and PR on residual tumor was again negative for both). Due to residual disease although minimal, pt received final dose (#6) of Perjeta  06/02/14. Herceptin single-agent q3wks started 06/23/14.     July 14, 2014 (Herceptin week #22)     Started single-agent Herceptin 06/23/14 and tolerated it well without side effects. Hair is starting to grow. Neuropathy in the fingers and bottoms of the feet continue but improve when she takes gabapentin 300 mg 3 times a day. She does not want to increase the dose since it is controlling her neuropathy and she does not want to increase fatigue due to medication side effect. She just returned from [place] trip to [place] Diarrhea continues but it was improved with more solid form during [place]. The only difference during that trip was that her friends were cooking all of her meals for her so a change in diet seems to have improved the diarrhea. Since her return, her diarrhea has resumed unchanged. She has about 6 watery episodes of diarrhea per day and has had 3 episodes thus far this morning. She continues Percocet as needed but also has a prior prescription for more tincture of opium which she will change back to since postop pain has resolved. She has not yet done repeat stool studies since she was on her trip but will do so soon with prior order. She has not seen the gastroenterologist yet but will ask her primary care doctor for a referral to see one soon. Dysuria symptoms recurred this past weekend. Bactrim worked well in the past after the 1st dose. She saw Dr PMarshall Cork9/4/15 and will start xrt next week on 07/21/14. She receives hydration every 3 weeks with Herceptin but requests more frequent visits since her diarrhea is ongoing. Labs (CBC,  CMP) 06/02/14 sig for H/H=10.6/32.9; gluc=221.   The patient completed the Herceptin every 3 weeks for total of 1 year of adjuvant therapy in 12-2014 and remains off therapy.     INTERVAL HISTORY:    The patient reports no new breast lesions. She had a colonoscopy earlier in to her was negative. She remains on vitamin-D and calcium. She is  reporting no pains. She also reports that she had a melanoma and reports no new skin lesions and need to follow up with dermatology and is planning to do that.  She denies any bleeding.   She is also followed by cardiology and gets periodic ECHO's.  She is diabetic and is trying to control it better. She denies any bleeding. She is taking oral iron.     PAST MEDICAL HISTORY:     Reviewed and no changes  Stage IIA T2N0 right breast IDC --> ypT1bN0   LUE DVT dx'ed by U/S 02/10/14 -- Lovenox started same day; resolved on 05/18/14 U/S; completed Lovenox 06/07/14   Stage I pT1aN0 melanoma of upper back s/p excision  DM II  Hyperlipidemia  Hypertension  BCC skin  Thyroid nodules bilaterally       PAST SURGICAL HISTORY:     Reviewed and no changes   05/28/14 Right lumpectomy + SLNB   02/05/14 L. chest port placement ([facility] IR)  07/04/11 Wide excision upper back melanoma + R. supraclavicular SLNB (lentigo melanoma and melanoma in situ, 0.19m, no residual invasive melanoma, 0/1 LN)   2013 Facelift  2012 Gastric bypass  1999 Cholecystectomy  1995 Left foot fracture repair with pins  Age [age]Tonsillectomy     PAST OB/GYN HISTORY:     Reviewed and no changes   G0P0. Menarche at 12 OCP x 15 yrs; last age 89s HRT late 71sfor <667ms; stopped after WHI study results reported. LMP age 71     HEALTH MAINTENANCE:    Colo -- 3-4 yrs ago; repeat due next year   DXA -- 2015 osteopenia; prescribed Fosamax but never started   Pap -- 11/2013 (one prior abnl pap and polyp removed)      FAMILY HISTORY:    Reviewed and no changes   Mother -- died 8565f CHF  Father -- died 879f decline after stroke  Sister -- died 4999f NHL; another sister alive and well     SOCIAL HISTORY:    Lives with partner, FrQuintella Reichert  Kids -- none   Retired [employer] developer  Tobacco -- only 4 yrs in early 20s, <1ppd  Alcohol  1 glass wine/mth at most   Drugs  none       CURRENT MEDS:  Please see below and I reviewed the medication list with the patient.    ALLERGIES:   No Known Allergies    REVIEW OF SYSTEMS (ROS):  A comprehensive 15-point ROS was performed and reviewed with the patient and is negative unless noted above.     EXAM:    GEN: The patient is well developed and well nourished, ambulatory, in no acute distress.  HEENT: normocephalic/atraumatic, anicteric sclera, there is no evidence for cervical or supraclavicular lymphadenopathy bilaterally.  HEART: S1, S2, regular rate and rhythm.  CHEST: Clear to auscultation bilaterally, good inspiratory effort, no crackles or rales or wheezing, no spine or flank tenderness to palpation.  AXILLAE: No palpable lesions bilaterally.  BREASTS: Were examined bilaterally, no new palpable breast lesions, no nipple discharge bilaterally.  ABDOMEN: soft, non-tender, non-distended, normal bowel sounds, no rebound or guarding, no appreciable hepatosplenomegaly, no inguinal lymphadenopathy bilaterally.  EXTREMITIES: No edema of the lower extremities.  SKIN: no petechiae or rash, no new skin lesions and complete skin exam was not performed.  NEURO: AOx3, EOMI, no focal deficits, gait steady.  PSYCHIATRIC: Good insight adequate mood and affect.    LAB DATA:    Reviewed the lab work with patient:    Results for LADINE, KIPER (MRN [id number]) as of 06/04/2017      Ref. Range 05/28/2017 09:30   Sodium Latest Ref Range: 135 - 146 mmol/L 138   Potassium Latest Ref Range: 3.5 - 5.3 mmol/L 5.4 (H)   Chloride Latest Ref Range: 98 - 110 mmol/L 107   Carbon Dioxide Latest Ref Range: 20 - 32 mmol/L 26   BUN Latest Ref Range: 7 - 25 mg/dL 21   Creatinine Latest Ref Range: 0.60 - 0.93 mg/dL 1.24 (H)   eGFR non-Afr.American Latest Ref Range: > OR = 60 mL/min/1.27m 43 (L)   eGFR African American Latest Ref Range: > OR = 60 mL/min/1.776m50 (L)   Glucose Latest Ref Range: 65 - 99 mg/dL 103 (H)   Calcium Latest Ref Range: 8.6 - 10.4 mg/dL 9.5    Total Protein Latest Ref Range: 6.1 - 8.1 g/dL 6.7   Alkaline Phos Latest Ref Range: 33 - 130 U/L 83   AST (SGOT) Latest Ref Range: 10 - 35 U/L 13   ALT (SGPT) Latest Ref Range: 6 - 29 U/L 9   Bilirubin, Total Latest Ref Range: 0.2 - 1.2 mg/dL 1.0   Albumin Latest Ref Range: 3.6 - 5.1 g/dL 4.1   BUN/Creatinine Ratio Latest Ref Range: 6 - 22 (calc) 17   [place] 27.29 Latest Ref Range: <38 U/mL 14   Cholesterol Latest Ref Range: <200 mg/dL 112   LDL-Cholesterol Latest Units: mg/dL (calc) 41   HDL Cholesterol Latest Ref Range: >50 mg/dL 50 (L)   Chol/HDLC Ratio Latest Ref Range: <5.0 (calc) 2.2   Non-HDL Cholesterol Latest Ref Range: <130 mg/dL (calc) 62   Ferritin Latest Ref Range: 20 - 288 ng/mL 16 (L)   Folate Latest Units: ng/mL 11.8   Globulin Latest Ref Range: 1.9 - 3.7 g/dL (calc) 2.6   Albumin/Glob Ratio Latest Ref Range: 1.0 - 2.5 (calc) 1.6   Hgb A1C Latest Ref Range: <5.7 % of total Hgb 6.0 (H)   Iron Latest Ref Range: 45 - 160 mcg/dL 115   IIBC Latest Ref Range: 250 - 450 mcg/dL (calc) 393   Iron Saturation Latest Ref Range: 11 - 50 % (calc) 29   Triglycerides Latest Ref Range: <150 mg/dL 129   Vitamin B12 Latest Ref Range: 200 - 1100 pg/mL 611   Vitamin D, 25-OH, Total Latest Ref Range: 30 - 100 ng/mL 40   WBC Latest Ref Range: 3.8 - 10.8 Thousand/uL 5.3   RBC Latest Ref Range: 3.80 - 5.10 Million/uL 3.57 (L)   HGB Latest Ref Range: 11.7 - 15.5 g/dL 11.5 (L)   HCT Latest Ref Range: 35.0 - 45.0 % 33.3 (L)   MCV Latest Ref Range: 80.0 - 100.0 fL 93.3   MCH Latest Ref Range: 27.0 - 33.0 pg 32.2   MCHC Latest Ref Range: 32.0 - 36.0 g/dL 34.5   RDW Latest Ref Range: 11.0 - 15.0 % 11.9   PLT Latest Ref Range: 140 - 400 Thousand/uL 179   MPV Latest Ref Range: 7.5 - 12.5 fL  9.5   SEGS Latest Units: % 54.7   Lymps Latest Units: % 36.7   Monocytes Latest Units: % 7.3   Eosinophils Latest Units: % 0.9   Basophils Latest Units: % 0.4   BANDS Latest Units: % CANCELED   Metamyelocytes Latest Units: % CANCELED    Myelocytes Latest Units: % CANCELED   Promyelocytes Latest Units: % CANCELED   Blasts Latest Units: % CANCELED   Reactive Lymphs Latest Ref Range: 0 - 10 % CANCELED   NRBC Latest Ref Range: 0 /100 WBC CANCELED   Abs Neutrophils Latest Ref Range: 1500 - 7800 cells/uL 2899   Abs Lymphs Latest Ref Range: 850 - 3900 cells/uL 1945   Abs Monocytes Latest Ref Range: 200 - 950 cells/uL 387   Abs Eosinophils Latest Ref Range: 15 - 500 cells/uL 48   Abs Basophils Latest Ref Range: 0 - 200 cells/uL 21   Abs Band Neutrophils Latest Ref Range: 0 - 750 cells/uL CANCELED   Abs Metamyelocytes Latest Ref Range: 0 cells/uL CANCELED   Abs Myelocytes Latest Ref Range: 0 cells/uL CANCELED   Abs Promyelocytes Latest Ref Range: 0 cells/uL CANCELED   Abs Blasts Latest Ref Range: 0 cells/uL CANCELED   Abs NRBC Latest Ref Range: 0 cells/uL 0   Comments Unknown CANCELED     11-26-2016 hemoglobin A1c percent, normal comprehensive metabolic panel except glucose 122, creatinine 1.25, normal liver function profile, hemoglobin 11.5, hematocrit 35.4.  B 12  is 559.  10/11/2016 she count 6.1, hemoglobin 10.8, hematocrit 32.7, platelet count of 227000 normal differential, ferritin 10, normal comprehensive metabolic panel except glucose 230, BUN 35, creatinine 1.56, calcium 8.5, hemoglobin A1c 11.1%.  Vitamin D 22 ng/mL.  June 14, 2016 WBC count 6.6, hemoglobin 10.5, hematocrit 32.4, platelet count of 201,000, normal differential, normal comprehensive metabolic panel except glucose 226, BUN 29, creatinine 1.53, normal liver function profile, vitamin-D 26 ng/mL, ferritin 11, B12 is 677, [place] 27-29 is 19.  02/17/2016 WBC count 5.2, hemoglobin 9.1, hematocrit 28.1, platelet count of 218000, normal differential, normal comprehensive metabolic panel except glucose of 166, BUN 30, creatinine 1.48, normal liver function profile, absolute retic count 39,240. Ferritin 7, B12 is 191. [place] 27-29 is 18. The vitamin-D is 32 ng/mL.   11/18/2015 WBC count 6.4, hemoglobin 9.6, hematocrit 29, platelet count of 224000, normal differential, normal comprehensive metabolic panel except glucose 148, creatinine 1.16, normal liver function profile, [place] 27-29 is 21.  August 03, 2015 creatinine 1.31,TSH 4.54, vitamin-D 23 ng/mL.  May 20, 2015 WBC count 5.3, hemoglobin 10.1, hematocrit 31.4, platelets of 224000 normal diff, [place] 27-29 is 16, Vit D 31 ng/mL normal comprehensive metabolic panel except glucose 156, creatinine 1.19, alk-phos 137.  12/27/2014 vitamin-D 34 ng/ml.  12/23/2014 normal basic metabolic panel except calcium 7.8 and glucose 103.  White Blood Cell Count: 7.0 THOUS/MCL [06-02-14 10:35]  Hemoglobin: 10.6 G/DL [06-02-14 10:35]  Hematocrit: 32.9 % [06-02-14 10:35]  Platelet Count: 290 THOUS/MCL [06-02-14 10:35]  RBC: 3.33 MILL/MCL [06-02-14 10:35]  MCV: 98.8 FL [06-02-14 10:35]  MCH: 31.8 PG [06-02-14 10:35]  MCHC: 32.2 G/DL [06-02-14 10:35]  RDW-CV: 13.6 % [06-02-14 10:35]  Neutrophils: 3.9 THOUS/MCL [06-02-14 10:35]  Lymphocyte: 2.5 THOUS/MCL [06-02-14 10:35]  Monocyte: 0.5 THOUS/MCL [06-02-14 10:35]  Eosinophil: 0.1 THOUS/MCL [06-02-14 10:35]  Basophil: 0.0 THOUS/MCL [06-02-14 10:35]  RBC Morphology: NO RBC ABNORMALITIES DETECTED BY AUTOMATED ANALYSIS. [06-02-14 10:35]  Plt Morph/Comm: DIFFERENTIAL PERFORMED BY AUTOMATED ANALYSIS. NO PLATELET ABNORMALITIES DETECTED BY AUTOMATED ANALYSIS. [06-02-14 10:35]  Sodium: 136 mmol/L [  06-02-14 10:35]  Potassium: 4.2 mmol/L [06-02-14 10:35]  Chloride: 107 mmol/L [06-02-14 10:35]  CO2: 21 mmol/L [06-02-14 10:35]  Electrolyte Balance: 8 mmol/L [06-02-14 10:35]  BUN: 12 mg/dL [06-02-14 10:35]  Creatinine: 0.9 mg/dL [06-02-14 10:35]  Glucose: 221 mg/dL [06-02-14 10:35]  Calcium: 9.0 mg/dL [06-02-14 10:35]  Protein, Total: 6.2 G/DL [06-02-14 10:35]  Albumin: 3.7 G/DL [06-02-14 10:35]  Alkaline Phosphatase: 81 U/L [06-02-14 10:35]  Bilirubin, Total: 0.9 mg/dL [06-02-14 10:35]  AST: 22 U/L [06-02-14 10:35]   ALT: 16 U/L [06-02-14 10:35]No data available for the last 5 years    IMAGING DATA:    Reviewed the imaging with patient:    June 03, 2017 bone density shows osteopenia T-score of -2.4 in the left femoral neck.    06-03-2017 Mammogram showed Stable post lumpectomy scarring and radiation therapy change in the right breast is benign.    Suggest this patient return for her routine annual screening mammogram in 1 year.    ACR BI-RADS Category 2 -Benign Finding.    05/24/2016 bilateral diagnostic 2D and 3D mammogram shows stable post lumpectomy changes in the right breast are benign.  December 27, 2015 echocardiogram shows a normal ejection fraction.  05-24-2015 bilateral diagnostic mammogram and bilateral breast ultrasound showed new post lumpectomy scar in the right breast is benign, stable punctate calcifications in the area areolar region of the right breast are benign, stable punctate calcifications in the left breast at 6 o'clock are benign with no evidence of malignancy.  11/22/2014 bilateral 2D and 3D mammogram shows new punctate calcifications in the periareolar region of the right breast are probably benign in mammographic follow-up in 6 months is recommended, new post lumpectomy scar in the right breast is benign. New punctate calcifications in the left breast at 6 o'clock up probably benign.  11/22/2014 bone density shows osteoporosis with a T-score of-2.6 in the right femoral neck.  August 31, 2014 echocardiogram shows left ventricular diastolic dysfunction, normal left ventricular size and systolic function.    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  09/27/2014 left thyroid nodule atypical follicular lesion of undetermined significance, a right thyroid nodule fine-needle aspiration negative for malignancy.    IMPRESSION/PLAN:     Problem # 1: BREAST CANCER (ICD-174.9)    Initial clinical stage IIA pT2N0 right IDC s/p bx 01/14/14 (ER/PR negative, Her-2 positive by copy number = 6.6, Ki-67=50%). MRI breast 01/29/14 showed lesion to be 2.2 x 1.6 x 2.0 cm. Staging PET/CT negative for distant mets. Neoadjuvant chemotherapy with Her-2 targeted therapy was suggested, since tumor size > 2cm, so recommended TCH + P (Taxotere, Carboplatin, Herceptin, Perjeta) q3wks x 6 cycles. Systemic therapies started 02/17/14 but pt developed severe diarrhea causing dehydration and electrolyte abnormalities requiring daily hydration and electrolyte replacements so changed regimen to weekly Taxol x 12 weeks with Herceptin/Perjeta q3wks and tolerated only slightly better. Right breast mass resolved on physical exam. Pt was becoming weaker due to ongoing diarrhea on Taxol so discontinued after 04/28/14 dose with plan for repeat breast imaging followed by surgery if response was good. Breast imaging 04/28/14 (mammo, u/s) and MRI 04/30/14 showed minimal residual disease so Taxol was discontinued after 04/28/14 dose (week #8) and Herceptin/Perjeta continued. Right lumpectomy + SLNB 05/28/14 showed 6 mm residual disease, grade 2, SBR=7/9 (lower mitotic rate than bx), 0/1 LN; repeat ER/PR negative. Since only  5 doses of Perjeta were given pre-operatively and there was residual disease in surgical specimen, she was given the final 6th dose of Perjeta 06/02/14 with usual q3wk dose of Herceptin. Single-agent Herceptin continued q3wk and completed a 1-yr course in 12-2014 and since then no therapy. The radiation therapy was completed. Last mammogram reviewed and negative and next one planned for August of 2019 and  discussed again signs and symptoms suggestive of recurrent disease. Clinically no evidence of any recurrent disease and I will continue to monitor.    Problem # 2: ACUTE DVT OF UPPER EXTREMITY (ICD-453.82)     LUE acute DVT dx'ed by u/s 02/10/14 due to sudden onset of left arm swelling, completed anticoagulation and no evidence of recurrent thrombosis. May consider to remove PORT was concerned about chance to develop DVT but advised that removing the PORT does not usually cause a DVT.    Problem # 3: ABNORMAL ECHO (ICD-793.2)    Baseline ECHO 02/10/14 with normal EF of 65-70% but with mild diastolic dysfunction. I advised her to continue f/u with Dr. Reubin Milan for monitoring of cardiac function and recommended again a repeat ECHO.    Problem # 4: THYROID NODULE (ICD-241)     She had a total thyroidectomy and no malignancy found. Follow-up with Endocrinology.    PROBLEM # 5 OSTEOPOROSIS    Repeat bone density was reviewed now she has osteopenia and remains on Fosamax and also vitamin-D and calcium.    PROBLEM # 6 MELANOMA    No new complaints and followed by dermatology.    PLAN:    1) Completed single-agent Herceptin q3wks to complete 33yrtotal course (06/23/14 thru 01/19/15).  Repeat mammogram 05-2017 reviewed and no new findings and to be done yearly.  2) Anemia due to severe iron and B12 deficiency, was given B12 injections and monitor the iron profile and remains on oral iron. Her H/H is improving and the ferritin is slowly too.  3) ECHO to be repeated as per Dr. RReubin Milanof cardiology.  4) Thyroidectomy done due to atypical left thyroid nodule and no malignancy, follow-up with Endocrinology.  5) Renal insufficiency and diabetes may contribute to anemia and advised her to stay well hydrated and follow-up with nephrology.  6) History of melanoma and she is followed by Dermatology.    Orders Placed This Encounter   Procedures    CBC w/ Diff [place]    Comprehensive Metabolic Panel - See Instructions    [place] 27.29, BLOOD    Vitamin D, 25-Hydroxy, Blood Yellow serum separator tube     Thank you very much for allowing our continued participation in the care of this patient.      More than 50% of this 40 min visit was spent in educating the patient about their condition, discussing compliance issues, counseling including answering all of the patients questions and coordination of care. This included review of relevant laboratory, radiology and other diagnostic tests, explaining medical management choices and the patient verbalized understanding. All risks, benefits, and alternatives were discussed in detail with the patient and the patient wished to proceed with the suggested treatment plan.     RTO: 4-6 months    I certify that I have reviewed the documentation contained in this clinical record and that it is accurately recorded. This document contains private and confidential health information protected by state and federal law and any release of this information requires the written prior authorization of the above mentioned patient.    Please  note this report was dictated with the use of voice recognition software.  It may contain inadvertent spelling or grammatical errors which were not detected during the editing process.       Take a virtual tour of our facility: [email or web address]    Clinical trials: [email or web address]        --------------------------------------  [name], MD, MBA

## 2017-06-04 NOTE — Patient Instructions (Signed)
Orders Placed This Encounter   Procedures   . CBC w/ Diff [place]   . Comprehensive Metabolic Panel - See Instructions   . [place] 27.29, BLOOD   . Vitamin D, 25-Hydroxy, Blood Yellow serum separator tube

## 2017-06-14 ENCOUNTER — Other Ambulatory Visit: Payer: Self-pay | Admitting: Hematology & Oncology

## 2017-06-14 DIAGNOSIS — M8580 Other specified disorders of bone density and structure, unspecified site: Secondary | ICD-10-CM

## 2017-06-15 ENCOUNTER — Other Ambulatory Visit: Payer: Self-pay | Admitting: Nurse Practitioner

## 2017-06-15 DIAGNOSIS — G629 Polyneuropathy, unspecified: Secondary | ICD-10-CM

## 2017-06-17 ENCOUNTER — Other Ambulatory Visit: Payer: Self-pay | Admitting: Nurse Practitioner

## 2017-06-17 DIAGNOSIS — G629 Polyneuropathy, unspecified: Secondary | ICD-10-CM

## 2017-06-17 MED ORDER — ALENDRONATE SODIUM 70 MG OR TABS
ORAL_TABLET | ORAL | 0 refills | Status: DC
Start: 2017-06-17 — End: 2018-06-05

## 2017-06-17 MED ORDER — GABAPENTIN 300 MG OR CAPS
ORAL_CAPSULE | ORAL | 0 refills | Status: DC
Start: 2017-06-17 — End: 2019-06-10

## 2017-06-17 MED ORDER — GABAPENTIN 300 MG OR CAPS
ORAL_CAPSULE | ORAL | 3 refills | Status: DC
Start: 2017-06-17 — End: 2018-03-27

## 2017-07-10 ENCOUNTER — Other Ambulatory Visit: Payer: Self-pay | Admitting: Hematology & Oncology

## 2017-07-10 DIAGNOSIS — M810 Age-related osteoporosis without current pathological fracture: Secondary | ICD-10-CM

## 2017-07-10 MED ORDER — ALENDRONATE SODIUM 70 MG OR TABS
ORAL_TABLET | ORAL | 0 refills | Status: DC
Start: 2017-07-10 — End: 2017-10-13

## 2017-07-15 ENCOUNTER — Other Ambulatory Visit: Payer: Self-pay | Admitting: Nurse Practitioner

## 2017-07-15 DIAGNOSIS — E538 Deficiency of other specified B group vitamins: Secondary | ICD-10-CM

## 2017-07-15 MED ORDER — VITAMIN B-12 1000 MCG OR TABS
ORAL_TABLET | ORAL | 1 refills | Status: DC
Start: 2017-07-15 — End: 2017-09-14

## 2017-09-14 ENCOUNTER — Other Ambulatory Visit: Payer: Self-pay | Admitting: Nurse Practitioner

## 2017-09-14 DIAGNOSIS — E538 Deficiency of other specified B group vitamins: Secondary | ICD-10-CM

## 2017-09-16 MED ORDER — VITAMIN B-12 1000 MCG OR TABS
ORAL_TABLET | ORAL | 3 refills | Status: DC
Start: 2017-09-16 — End: 2018-03-06

## 2017-10-13 ENCOUNTER — Other Ambulatory Visit: Payer: Self-pay | Admitting: Hematology & Oncology

## 2017-10-13 DIAGNOSIS — M810 Age-related osteoporosis without current pathological fracture: Secondary | ICD-10-CM

## 2017-10-16 MED ORDER — ALENDRONATE SODIUM 70 MG OR TABS
ORAL_TABLET | ORAL | 0 refills | Status: DC
Start: 2017-10-16 — End: 2017-12-05

## 2017-12-03 ENCOUNTER — Ambulatory Visit (INDEPENDENT_AMBULATORY_CARE_PROVIDER_SITE_OTHER): Payer: Medicare Other | Admitting: Hematology & Oncology

## 2017-12-03 ENCOUNTER — Encounter: Payer: Self-pay | Admitting: Hematology & Oncology

## 2017-12-03 VITALS — BP 123/75 | HR 72 | Temp 97.4°F | Resp 16 | Ht 65.0 in | Wt 182.8 lb

## 2017-12-03 DIAGNOSIS — Z171 Estrogen receptor negative status [ER-]: Secondary | ICD-10-CM

## 2017-12-03 DIAGNOSIS — N183 Chronic kidney disease, stage 3 unspecified (CMS-HCC): Secondary | ICD-10-CM

## 2017-12-03 DIAGNOSIS — E559 Vitamin D deficiency, unspecified: Secondary | ICD-10-CM

## 2017-12-03 DIAGNOSIS — C50911 Malignant neoplasm of unspecified site of right female breast: Principal | ICD-10-CM

## 2017-12-03 DIAGNOSIS — Z780 Asymptomatic menopausal state: Secondary | ICD-10-CM

## 2017-12-03 DIAGNOSIS — C4361 Malignant melanoma of right upper limb, including shoulder: Secondary | ICD-10-CM

## 2017-12-03 DIAGNOSIS — M8589 Other specified disorders of bone density and structure, multiple sites: Secondary | ICD-10-CM

## 2017-12-03 NOTE — Patient Instructions (Signed)
Orders Placed This Encounter   Procedures   . Digital Diagnostic Mammogram Bilateral   . [place] 27.29, BLOOD   . CBC w/ Diff [place]   . Comprehensive Metabolic Panel, Plasma   . Vitamin D, 25-Hydroxy, Blood Gel-Barrier Tube

## 2017-12-03 NOTE — Progress Notes (Signed)
[name], MD, MBA    Associate Clinical Professor  [facility] Note  [place]  [address], [address], [place], [place] [address]  Tel.: [phone] Fax: [phone]      Date of Service:  12/03/2017    Farran Amsden, [name] 02-15-1946    REFERRING MD: Bonney Leitz  Radiation oncology: Marshall Cork  PCP: [name]  GYN: [name]   Cardiology: Gypsy Decant    CHIEF COMPLAINT: Routine follow-up visit for right breast cancer and melanoma    HISTORY OF PRESENT ILLNESS:    72 y/o Caucasian woman with mammographically detected clinical stage IIA T2N0 right breast invasive ductal carcinoma s/p bx 01/14/14 (SBR 9/9, ER/PR negative, Ki-67=50%, Her-2/neu FISH ratio=1.14 but copy number=6.6 so positive). MRI breast 01/29/14 showed mass to be 2.2 x 1.6 x 2.0 cm. Staging PET/CT 01/27/14 was negative for distant mets; showed only known right breast tumor and incidental left thyroid nodule. Planned neoadjuvant systemic therapy: TCH + P x 6 cycles followed by surgery and radiation therapy. Cycle #1 TCH+P given 02/17/14. Pt developed severe diarrhea and dehydration causing electrolyte derangements requiring daily replacement therapies and hydration. Therefore, chemotherapy was changed to weekly Taxol for 12 weeks and Herceptin plus Perjeta every 3 weeks was continued. Taxol started 03/10/14 and completed only 8 weeks on 04/28/14 due to cumulative severe side effects and MRI breast 04/30/14 showing only 46m of residual enhancement. Herceptin/Perjeta continued q3wks for 6 total cycles. Right lumpectomy + SLNB done 05/28/14 (6 mm residual IDC, grade 2, SBR 7/9, no LI, 0/1 LN; ypT1bN0; residual tumor had lower a mitotic score compared to initial biopsy specimen likely due to chemotherapy effect; repeat ER and PR on residual tumor was again negative for both). Due to residual disease although minimal, pt received final dose (#6) of Perjeta  06/02/14. Herceptin single-agent q3wks started 06/23/14.     July 14, 2014 (Herceptin week #22)     Started single-agent Herceptin 06/23/14 and tolerated it well without side effects. Hair is starting to grow. Neuropathy in the fingers and bottoms of the feet continue but improve when she takes gabapentin 300 mg 3 times a day. She does not want to increase the dose since it is controlling her neuropathy and she does not want to increase fatigue due to medication side effect. She just returned from her trip to [place] Diarrhea continues but it was improved with more solid form during her [place]. The only difference during that trip was that her friends were cooking all of her meals for her so a change in diet seems to have improved the diarrhea. Since her return, her diarrhea has resumed unchanged. She has about 6 watery episodes of diarrhea per day and has had 3 episodes thus far this morning. She continues Percocet as needed but also has a prior prescription for more tincture of opium which she will change back to since postop pain has resolved. She has not yet done repeat stool studies since she was on her trip but will do so soon with prior order. She has not seen the gastroenterologist yet but will ask her primary care doctor for a referral to see one soon. Dysuria symptoms recurred this past weekend. Bactrim worked well in the past after the 1st dose. She saw Dr PMarshall Cork9/4/15 and will start xrt next week on 07/21/14. She receives hydration every 3 weeks with Herceptin but requests more frequent visits since her diarrhea is ongoing. Labs (CBC,  CMP) 06/02/14 sig for H/H=10.6/32.9; gluc=221.   The patient completed the Herceptin every 3 weeks for total of 1 year of adjuvant therapy in 12-2014 and remains off therapy.     INTERVAL HISTORY:    The patient reports no new breast lesions. She had a colonoscopy < 1 year  go and it was negative. She remains on vitamin-D and calcium. She had one episode of pains in the left breast and it resolved and no trauma. She also reports that she had a melanoma and reports no new skin lesions and follows with dermatology.  She denies any bleeding.   She is also followed by cardiology and gets periodic ECHO's.  She is diabetic and is trying to control it better.      PAST MEDICAL HISTORY:     Reviewed and no changes  Stage IIA T2N0 right breast IDC --> ypT1bN0   LUE DVT dx'ed by U/S 02/10/14 -- Lovenox started same day; resolved on 05/18/14 U/S; completed Lovenox 06/07/14   Stage I pT1aN0 melanoma of upper back s/p excision  DM II  Hyperlipidemia  Hypertension  BCC skin  Thyroid nodules bilaterally       PAST SURGICAL HISTORY:     Reviewed and no changes   05/28/14 Right lumpectomy + SLNB   02/05/14 L. chest port placement ([facility])  07/04/11 Wide excision upper back melanoma + R. supraclavicular SLNB (lentigo melanoma and melanoma in situ, 0.75 mm, no residual invasive melanoma, 0/1 LN)   2013 Facelift  2012 Gastric bypass  1999 Cholecystectomy  1995 Left foot fracture repair with pins  Age 82 Tonsillectomy     PAST OB/GYN HISTORY:     Reviewed and no changes   G0P0. Menarche at 45. OCP x 15 yrs; last age 29s. HRT late 6s for <32mhs; stopped after WHI study results reported. LMP age 72      HEALTH MAINTENANCE:    Colo -- 3-4 yrs ago; repeat due next year   DXA -- 2015 osteopenia; prescribed Fosamax but never started   Pap -- 11/2013 (one prior abnl pap and polyp removed)      FAMILY HISTORY:    Reviewed and no changes   Mother -- died 840of CHF  Father -- died 881of decline after stroke  Sister -- died 473of NHL; another sister alive and well     SOCIAL HISTORY:    Lives with partner, [name]   Kids -- none   Retired [employer] developer   Tobacco -- only 4 yrs in early 20s, <1ppd   Alcohol  1 glass wine/mth at most   Drugs  none      CURRENT MEDS:  Please see below and I reviewed the medication list with the patient.    ALLERGIES:   No Known Allergies    REVIEW OF SYSTEMS (ROS):  A comprehensive 15-point ROS was performed and reviewed with the patient and is negative unless noted above.     EXAM:    Vitals 02/29/2016 03/07/2016 03/14/2016 06/22/2016 12/04/2016 06/04/2017 26/57/8469  Systolic [id number]  Diastolic 76 71 69 75 74 72 75   Position - - - - Sitting Sitting Sitting   Site - - - - Right arm Right arm Right arm   Cuff Size - - - - Regular Regular Regular   Pulse 83 99 96 109 98 - 72   Resp - - - - 16 16 16  Temp - - - - 97.8 97.3 97.4   Temp Source - - - - 101 101 5   Weight (kg) 94.6 kg 94.65 kg - 97.2 kg 92.5 kg 83.008 kg 82.918 kg   Weight (lbs) 208 lb 8.9 oz 208 lb 10.7 oz - 214 lb 4.6 oz 203 lb 14.8 oz 183 lb 182 lb 12.8 oz   Height (cm) 166.4 cm 166.4 cm 166.4 cm 166.4 cm 165.1 cm 165.1 cm 165.1 cm   Height (ft in) 5' 5.512" 5' 5.512" 5' 5.512" 5' 5.512" 5' 5"  5' 5"  5' 5"    BMI (kg/m2) 34.16 kg/m2 34.18 kg/m2 - 35.1 kg/m2 33.93 kg/m2 30.45 kg/m2 30.42 kg/m2   BSA (m2) 2.09 m2 2.09 m2 - 2.12 m2 2.06 m2 1.95 m2 1.95 m2   Fall Risk - - - - No fall risk identified No fall risk identified No fall risk identified   Some recent data might be hidden     GENERAL: The patient is well developed and well nourished, ambulatory, in no acute distress.  HEENT: normocephalic/atraumatic, anicteric sclera.  HEART: S1, S2, regular rate and rhythm.  CHEST: Clear to auscultation bilaterally, good inspiratory effort, no crackles or rales or wheezing, no spine or flank tenderness to palpation.  AXILLAE: No palpable lesions bilaterally.  BREASTS: Were examined bilaterally, no new palpable breast lesions, no nipple discharge bilaterally.  ABDOMEN: soft, non-tender, non-distended, normal bowel sounds, no rebound or guarding, no appreciable masses   EXTREMITIES: No edema of the lower extremities.  SKIN: no petechiae or rash, no new skin lesions and complete skin exam was not performed.  NEURO: AOx3, EOMI, no focal deficits, gait steady.  PSYCHIATRIC: Good insight adequate mood and affect.    LAB DATA:    Reviewed the lab work with patient:    October 31, 2017 WBC count 5.1, hemoglobin 11.9, hematocrit 36.4, platelets of 202000, normal comprehensive metabolic panel except glucose 103, BUN 28, creatinine 1.3, total bilirubin 1.3, hemoglobin A1c 6.2%, TSH 0.33.    Results for MILEENA, ROTHENBERGER (MRN [id number]) as of 12/03/2017      Ref. Range 08/24/2015 09:54 11/18/2015 09:37 02/17/2016 08:50 06/14/2016 09:33 05/28/2017 09:30   Sodium Latest Ref Range: 135 - 146 mmol/L 139 139 138 137 138   Potassium Latest Ref Range: 3.5 - 5.3 mmol/L 5.7 (H) 5.1 4.9 4.7 5.4 (H)   Chloride Latest Ref Range: 98 - 110 mmol/L 108 106 104 101 107   Carbon Dioxide Latest Ref Range: 20 - 32 mmol/L 21 24 25 25 26    BUN Latest Ref Range: 7 - 25 mg/dL 21 23 30  (H) 29 (H) 21   Creatinine Latest Ref Range: 0.60 - 0.93 mg/dL 1.19 (H) 1.16 (H) 1.48 (H) 1.53 (H) 1.24 (H)   eGFR non-Afr.American Latest Ref Range: > OR = 60 mL/min/1.37m 46 (L) 47 (L) 35 (L) 34 (L) 43 (L)   eGFR African American Latest Ref Range: > OR = 60 mL/min/1.765m53 (L) 55 (L) 41 (L) 39 (L) 50 (L)   Glucose Latest Ref Range: 65 - 99 mg/dL 172 (H) 148 (H) 166 (H) 226 (H) 103 (H)   Calcium Latest Ref Range: 8.6 - 10.4 mg/dL 8.9 8.7 8.6 8.8 9.5   Total Protein Latest Ref Range: 6.1 - 8.1 g/dL 6.6 6.7 6.6 6.9 6.7   Alkaline Phos Latest Ref Range: 33 - 130 U/L 136 (H) 126 98 116 83   AST (SGOT) Latest Ref Range: 10 - 35 U/L 16 15 18  12  13   ALT (SGPT) Latest Ref Range: 6 - 29 U/L 9 11 15 8 9    Bilirubin, Total Latest Ref Range: 0.2 - 1.2 mg/dL 0.9 0.8 0.7 0.6 1.0   Albumin Latest Ref Range: 3.6 - 5.1 g/dL 4.0 4.1 3.9 4.1 4.1   BUN/Creatinine Ratio Latest Ref Range: 6 - 22 (calc) 18 20 20 19 17     Bertrand 27.29 Latest Ref Range: <38 U/mL 8 21 18 19 14    Cholesterol Latest Ref Range: <200 mg/dL     112   LDL-Cholesterol Latest Units: mg/dL (calc)     41   HDL Cholesterol Latest Ref Range: >50 mg/dL     50 (L)   Chol/HDLC Ratio Latest Ref Range: <5.0 (calc)     2.2   Non-HDL Cholesterol Latest Ref Range: <130 mg/dL (calc)     62   Ferritin Latest Ref Range: 20 - 288 ng/mL   7 (L) 11 (L) 16 (L)   Folate Latest Units: ng/mL     11.8   Globulin Latest Ref Range: 1.9 - 3.7 g/dL (calc) 2.6 2.6 2.7 2.8 2.6   Albumin/Glob Ratio Latest Ref Range: 1.0 - 2.5 (calc) 1.5 1.6 1.4 1.5 1.6   Hgb A1C Latest Ref Range: <5.7 % of total Hgb     6.0 (H)   Iron Latest Ref Range: 45 - 160 mcg/dL   30 (L) 60 115   IIBC Latest Ref Range: 250 - 450 mcg/dL (calc)   470 (H) 422 393   Iron Saturation Latest Ref Range: 11 - 50 % (calc)   6 (L) 14 29   Triglycerides Latest Ref Range: <150 mg/dL     129   Vitamin B12 Latest Ref Range: 200 - 1,100 pg/mL   191 (L) 677 611   Vitamin D, 25-OH, Total Latest Ref Range: 30 - 100 ng/mL   32 26 (L) 40   WBC Latest Ref Range: 3.8 - 10.8 Thousand/uL 5.9 6.4 5.2 6.6 5.3   RBC Latest Ref Range: 3.80 - 5.10 Million/uL 3.50 (L) 3.36 (L) 3.27 (L) 3.71 (L) 3.57 (L)   HGB Latest Ref Range: 11.7 - 15.5 g/dL 9.8 (L) 9.6 (L) 9.1 (L) 10.5 (L) 11.5 (L)   HCT Latest Ref Range: 35.0 - 45.0 % 31.1 (L) 29.0 (L) 28.1 (L) 32.4 (L) 33.3 (L)   MCV Latest Ref Range: 80.0 - 100.0 fL 88.9 86.3 85.9 87.3 93.3   MCH Latest Ref Range: 27.0 - 33.0 pg 28.2 28.4 27.8 28.3 32.2   MCHC Latest Ref Range: 32.0 - 36.0 g/dL 31.6 (L) 32.9 32.4 32.4 34.5   RDW Latest Ref Range: 11.0 - 15.0 % 14.8 13.8 13.2 14.4 11.9   PLT Latest Ref Range: 140 - 400 Thousand/uL 257 224 218 201 179   MPV Latest Ref Range: 7.5 - 12.5 fL 7.8 7.5 9.2 9.4 9.5   SEGS Latest Units: % 64.4 64.0 59.7 55 54.7   Lymps Latest Units: % 29.5 29.0 31.7 32.9 36.7   Monocytes Latest Units: % 5.4 6.3 7.4 8.3 7.3   Eosinophils Latest Units: % 0.5 0.5 1.0 3.5 0.9    Basophils Latest Units: % 0.2 0.2 0.2 0.3 0.4   BANDS Latest Units: %     CANCELED   Metamyelocytes Latest Units: %     CANCELED   Myelocytes Latest Units: %     CANCELED   Promyelocytes Latest Units: %     CANCELED   Blasts Latest Units: %  CANCELED   Reactive Lymphs Latest Ref Range: 0 - 10 %     CANCELED   NRBC Latest Ref Range: 0 /100 WBC     CANCELED   Abs Neutrophils Latest Ref Range: 1,500 - 7,800 cells/uL 3,800 4,096 3,104 3,630 2,899   Abs Lymphs Latest Ref Range: 850 - 3,900 cells/uL 1,741 1,856 1,648 2,171 1,945   Abs Monocytes Latest Ref Range: 200 - 950 cells/uL 319 403 385 548 387   Abs Eosinophils Latest Ref Range: 15 - 500 cells/uL 30 32 52 231 48   Abs Basophils Latest Ref Range: 0 - 200 cells/uL 12 13 10 20 21    Abs Band Neutrophils Latest Ref Range: 0 - 750 cells/uL     CANCELED   Abs Metamyelocytes Latest Ref Range: 0 cells/uL     CANCELED   Abs Myelocytes Latest Ref Range: 0 cells/uL     CANCELED   Abs Promyelocytes Latest Ref Range: 0 cells/uL     CANCELED   Abs Blasts Latest Ref Range: 0 cells/uL     CANCELED   Abs NRBC Latest Ref Range: 0 cells/uL   0 0 0   Comments Unknown     CANCELED   Retic %, Auto Latest Units: %   1.2     Retic Absolute Latest Ref Range: 20,000 - 80,000 cells/uL   39,240         11-26-2016 hemoglobin A1c percent, normal comprehensive metabolic panel except glucose 122, creatinine 1.25, normal liver function profile, hemoglobin 11.5, hematocrit 35.4.  B 12  is 559.  10/11/2016 she count 6.1, hemoglobin 10.8, hematocrit 32.7, platelet count of 227000 normal differential, ferritin 10, normal comprehensive metabolic panel except glucose 230, BUN 35, creatinine 1.56, calcium 8.5, hemoglobin A1c 11.1%.  Vitamin D 22 ng/mL.  June 14, 2016 WBC count 6.6, hemoglobin 10.5, hematocrit 32.4, platelet count of 201,000, normal differential, normal comprehensive metabolic panel except glucose 226, BUN 29, creatinine 1.53, normal liver function  profile, vitamin-D 26 ng/mL, ferritin 11, B12 is 677, [place] 27-29 is 19.  02/17/2016 WBC count 5.2, hemoglobin 9.1, hematocrit 28.1, platelet count of 218000, normal differential, normal comprehensive metabolic panel except glucose of 166, BUN 30, creatinine 1.48, normal liver function profile, absolute retic count 39,240. Ferritin 7, B12 is 191. [place] 27-29 is 18. The vitamin-D is 32 ng/mL.  11/18/2015 WBC count 6.4, hemoglobin 9.6, hematocrit 29, platelet count of 224000, normal differential, normal comprehensive metabolic panel except glucose 148, creatinine 1.16, normal liver function profile, [place] 27-29 is 21.  August 03, 2015 creatinine 1.31,TSH 4.54, vitamin-D 23 ng/mL.  May 20, 2015 WBC count 5.3, hemoglobin 10.1, hematocrit 31.4, platelets of 224000 normal diff, [place] 27-29 is 16, Vit D 31 ng/mL normal comprehensive metabolic panel except glucose 156, creatinine 1.19, alk-phos 137.  12/27/2014 vitamin-D 34 ng/ml.  12/23/2014 normal basic metabolic panel except calcium 7.8 and glucose 103.  White Blood Cell Count: 7.0 THOUS/MCL [06-02-14 10:35]  Hemoglobin: 10.6 G/DL [06-02-14 10:35]  Hematocrit: 32.9 % [06-02-14 10:35]  Platelet Count: 290 THOUS/MCL [06-02-14 10:35]  RBC: 3.33 MILL/MCL [06-02-14 10:35]  MCV: 98.8 FL [06-02-14 10:35]  MCH: 31.8 PG [06-02-14 10:35]  MCHC: 32.2 G/DL [06-02-14 10:35]  RDW-CV: 13.6 % [06-02-14 10:35]  Neutrophils: 3.9 THOUS/MCL [06-02-14 10:35]  Lymphocyte: 2.5 THOUS/MCL [06-02-14 10:35]  Monocyte: 0.5 THOUS/MCL [06-02-14 10:35]  Eosinophil: 0.1 THOUS/MCL [06-02-14 10:35]  Basophil: 0.0 THOUS/MCL [06-02-14 10:35]  RBC Morphology: NO RBC ABNORMALITIES DETECTED BY AUTOMATED ANALYSIS. [06-02-14 10:35]  Plt Morph/Comm: DIFFERENTIAL PERFORMED  BY AUTOMATED ANALYSIS. NO PLATELET ABNORMALITIES DETECTED BY AUTOMATED ANALYSIS. [06-02-14 10:35]  Sodium: 136 mmol/L [06-02-14 10:35]  Potassium: 4.2 mmol/L [06-02-14 10:35]  Chloride: 107 mmol/L [06-02-14 10:35]  CO2: 21 mmol/L [06-02-14 10:35]   Electrolyte Balance: 8 mmol/L [06-02-14 10:35]  BUN: 12 mg/dL [06-02-14 10:35]  Creatinine: 0.9 mg/dL [06-02-14 10:35]  Glucose: 221 mg/dL [06-02-14 10:35]  Calcium: 9.0 mg/dL [06-02-14 10:35]  Protein, Total: 6.2 G/DL [06-02-14 10:35]  Albumin: 3.7 G/DL [06-02-14 10:35]  Alkaline Phosphatase: 81 U/L [06-02-14 10:35]  Bilirubin, Total: 0.9 mg/dL [06-02-14 10:35]  AST: 22 U/L [06-02-14 10:35]  ALT: 16 U/L [06-02-14 10:35]No data available for the last 5 years    IMAGING DATA:    Reviewed the imaging with patient:    June 03, 2017 bone density shows osteopenia T-score of -2.4 in the left femoral neck.    06-03-2017 Mammogram showed Stable post lumpectomy scarring and radiation therapy change in the right breast is benign.    Suggest this patient return for her routine annual screening mammogram in 1 year.    ACR BI-RADS Category 2 -Benign Finding.    05/24/2016 bilateral diagnostic 2D and 3D mammogram shows stable post lumpectomy changes in the right breast are benign.  December 27, 2015 echocardiogram shows a normal ejection fraction.  05-24-2015 bilateral diagnostic mammogram and bilateral breast ultrasound showed new post lumpectomy scar in the right breast is benign, stable punctate calcifications in the area areolar region of the right breast are benign, stable punctate calcifications in the left breast at 6 o'clock are benign with no evidence of malignancy.  11/22/2014 bilateral 2D and 3D mammogram shows new punctate calcifications in the periareolar region of the right breast are probably benign in mammographic follow-up in 6 months is recommended, new post lumpectomy scar in the right breast is benign. New punctate calcifications in the left breast at 6 o'clock up probably benign.  11/22/2014 bone density shows osteoporosis with a T-score of-2.6 in the right femoral neck.  August 31, 2014 echocardiogram shows left ventricular diastolic dysfunction, normal left ventricular size and systolic function.     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  09/27/2014 left thyroid nodule atypical follicular lesion of undetermined significance, a right thyroid nodule fine-needle aspiration negative for malignancy.    IMPRESSION/PLAN:    Problem # 1: BREAST CANCER (ICD-174.9)    Initial clinical stage IIA pT2N0 right IDC s/p bx 01/14/14 (ER/PR negative, Her-2 positive by copy number = 6.6, Ki-67=50%). MRI breast 01/29/14 showed lesion to be 2.2 x 1.6 x 2.0 cm. Staging PET/CT negative for distant mets. Neoadjuvant chemotherapy with Her-2 targeted therapy was suggested, since tumor size > 2cm, so recommended TCH + P (Taxotere, Carboplatin, Herceptin, Perjeta) q3wks x 6 cycles. Systemic therapies started 02/17/14 but pt developed severe diarrhea causing dehydration and electrolyte abnormalities requiring daily hydration and electrolyte replacements so changed regimen to weekly Taxol x 12 weeks with Herceptin/Perjeta q3wks and tolerated only slightly better. Right breast mass resolved on physical exam. Pt was becoming weaker due to ongoing diarrhea on Taxol so discontinued after 04/28/14 dose with plan for repeat breast imaging followed by surgery if response was good. Breast imaging 04/28/14 (mammo, u/s) and MRI 04/30/14 showed minimal residual disease so Taxol was discontinued after 04/28/14 dose (week #8) and Herceptin/Perjeta continued. Right lumpectomy + SLNB 05/28/14 showed 6 mm residual  disease, grade 2, SBR=7/9 (lower mitotic rate than bx), 0/1 LN; repeat ER/PR negative. Since only 5 doses of Perjeta were given pre-operatively and there was residual disease in surgical specimen, she was given the final 6th dose of Perjeta 06/02/14 with usual q3wk dose of Herceptin. Single-agent Herceptin continued q3wk and completed a 1-yr course in 12-2014 and since then no  therapy. The radiation therapy was completed. Last mammogram reviewed and negative and next one planned for August of 2019 and  discussed again signs and symptoms suggestive of recurrent disease. Clinically no evidence of any recurrent disease and I will continue to monitor.    Problem # 2: ACUTE DVT OF UPPER EXTREMITY (ICD-453.82)    LUE acute DVT dx'ed by u/s 02/10/14 due to sudden onset of left arm swelling, completed anticoagulation and no evidence of recurrent thrombosis. May consider to remove PORT was concerned about chance to develop DVT but advised that removing the PORT does not usually cause a DVT.    Problem # 3: ABNORMAL ECHO (ICD-793.2)    Baseline ECHO 02/10/14 with normal EF of 65-70% but with mild diastolic dysfunction. I advised her to continue f/u with Dr. Reubin Milan for monitoring of cardiac function and recommended again a repeat ECHO.    Problem # 4: THYROID NODULE (ICD-241)     She had a total thyroidectomy and no malignancy found. Follow-up with Endocrinology.    PROBLEM # 5 OSTEOPOROSIS    Repeat bone density was reviewed now she has osteopenia and remains on Fosamax and also vitamin-D and calcium.    PROBLEM # 6 MELANOMA    No new complaints and followed by dermatology.    PLAN:    1) Completed single-agent Herceptin q3wks to complete 23yrtotal course (06/23/14 thru 01/19/15).  Repeat mammogram 05-2018 planned.   2) Anemia due to severe iron and B12 deficiency, now no more anemia and continues on oral B12 supplementation and will monitor.  3) ECHO to be repeated as per Dr. RReubin Milanof cardiology.  4) Thyroidectomy done due to atypical left thyroid nodule and no malignancy, follow-up with Endocrinology.  5) Renal insufficiency and diabetes may contribute to anemia and advised her to stay well hydrated and follow-up with nephrology.  Her creatinine is stable.  6) History of melanoma and she is followed by Dermatology.    No orders of the defined types were placed in this encounter.     Thank you very much for allowing our continued participation in the care of this patient.     More than 50% of this 30 min visit was spent in educating the patient about their condition, discussing compliance issues, counseling including answering all of the patients questions and coordination of care. This included review of relevant laboratory, radiology and other diagnostic tests, explaining medical management choices and the patient verbalized understanding. All risks, benefits, and alternatives were discussed in detail with the patient and the patient wished to proceed with the suggested treatment plan.     RTO: 4-6 months    I certify that I have reviewed the documentation contained in this clinical record and that it is accurately recorded. This document contains private and confidential health information protected by state and federal law and any release of this information requires the written prior authorization of the above mentioned patient.    Please note this report was dictated with the use of voice recognition software.  It may contain inadvertent spelling or grammatical errors which were not detected during the editing  process.       Take a virtual tour of our facility: [email or web address]    Clinical trials: [email or web address]        --------------------------------------  [name], MD, MBA

## 2017-12-05 ENCOUNTER — Other Ambulatory Visit: Payer: Self-pay | Admitting: Hematology & Oncology

## 2017-12-05 DIAGNOSIS — M810 Age-related osteoporosis without current pathological fracture: Secondary | ICD-10-CM

## 2017-12-12 MED ORDER — ALENDRONATE SODIUM 70 MG OR TABS
ORAL_TABLET | ORAL | 0 refills | Status: DC
Start: 2017-12-12 — End: 2018-01-11

## 2017-12-18 ENCOUNTER — Other Ambulatory Visit: Payer: Self-pay | Admitting: Hematology & Oncology

## 2017-12-18 DIAGNOSIS — M810 Age-related osteoporosis without current pathological fracture: Secondary | ICD-10-CM

## 2018-01-11 ENCOUNTER — Other Ambulatory Visit: Payer: Self-pay | Admitting: Hematology & Oncology

## 2018-01-11 DIAGNOSIS — M810 Age-related osteoporosis without current pathological fracture: Secondary | ICD-10-CM

## 2018-01-13 MED ORDER — ALENDRONATE SODIUM 70 MG OR TABS
ORAL_TABLET | ORAL | 0 refills | Status: DC
Start: 2018-01-13 — End: 2018-02-08

## 2018-02-08 ENCOUNTER — Other Ambulatory Visit: Payer: Self-pay | Admitting: Hematology & Oncology

## 2018-02-08 DIAGNOSIS — M810 Age-related osteoporosis without current pathological fracture: Secondary | ICD-10-CM

## 2018-02-10 MED ORDER — ALENDRONATE SODIUM 70 MG OR TABS
ORAL_TABLET | ORAL | 0 refills | Status: DC
Start: 2018-02-10 — End: 2018-03-10

## 2018-03-06 ENCOUNTER — Other Ambulatory Visit: Payer: Self-pay | Admitting: Nurse Practitioner

## 2018-03-06 DIAGNOSIS — E538 Deficiency of other specified B group vitamins: Secondary | ICD-10-CM

## 2018-03-06 MED ORDER — VITAMIN B-12 1000 MCG OR TABS
ORAL_TABLET | ORAL | 2 refills | Status: DC
Start: 2018-03-06 — End: 2018-05-27

## 2018-03-10 ENCOUNTER — Other Ambulatory Visit: Payer: Self-pay | Admitting: Hematology & Oncology

## 2018-03-10 DIAGNOSIS — M810 Age-related osteoporosis without current pathological fracture: Secondary | ICD-10-CM

## 2018-03-10 MED ORDER — ALENDRONATE SODIUM 70 MG OR TABS
ORAL_TABLET | ORAL | 0 refills | Status: DC
Start: 2018-03-10 — End: 2018-03-27

## 2018-03-27 ENCOUNTER — Other Ambulatory Visit: Payer: Self-pay | Admitting: Nurse Practitioner

## 2018-03-27 ENCOUNTER — Other Ambulatory Visit: Payer: Self-pay | Admitting: Hematology & Oncology

## 2018-03-27 DIAGNOSIS — M810 Age-related osteoporosis without current pathological fracture: Secondary | ICD-10-CM

## 2018-03-27 DIAGNOSIS — G629 Polyneuropathy, unspecified: Secondary | ICD-10-CM

## 2018-03-27 MED ORDER — GABAPENTIN 300 MG OR CAPS
ORAL_CAPSULE | ORAL | 2 refills | Status: DC
Start: 2018-03-27 — End: 2018-08-13

## 2018-03-31 MED ORDER — ALENDRONATE SODIUM 70 MG OR TABS
ORAL_TABLET | ORAL | 0 refills | Status: DC
Start: 2018-03-31 — End: 2018-04-24

## 2018-04-24 ENCOUNTER — Other Ambulatory Visit: Payer: Self-pay | Admitting: Hematology & Oncology

## 2018-04-24 DIAGNOSIS — M810 Age-related osteoporosis without current pathological fracture: Secondary | ICD-10-CM

## 2018-04-25 MED ORDER — ALENDRONATE SODIUM 70 MG OR TABS
ORAL_TABLET | ORAL | 0 refills | Status: DC
Start: 2018-04-25 — End: 2018-07-28

## 2018-05-22 ENCOUNTER — Other Ambulatory Visit: Payer: Self-pay | Admitting: Hematology & Oncology

## 2018-05-22 DIAGNOSIS — M810 Age-related osteoporosis without current pathological fracture: Secondary | ICD-10-CM

## 2018-05-27 ENCOUNTER — Other Ambulatory Visit: Payer: Self-pay | Admitting: Nurse Practitioner

## 2018-05-27 DIAGNOSIS — E538 Deficiency of other specified B group vitamins: Secondary | ICD-10-CM

## 2018-05-28 MED ORDER — VITAMIN B-12 1000 MCG OR TABS
ORAL_TABLET | ORAL | 1 refills | Status: DC
Start: 2018-05-28 — End: 2018-07-26

## 2018-06-05 ENCOUNTER — Telehealth: Payer: Self-pay | Admitting: Hematology & Oncology

## 2018-06-05 DIAGNOSIS — M8580 Other specified disorders of bone density and structure, unspecified site: Secondary | ICD-10-CM

## 2018-06-05 MED ORDER — ALENDRONATE SODIUM 70 MG OR TABS
70.0000 mg | ORAL_TABLET | ORAL | 1 refills | Status: DC
Start: 2018-06-05 — End: 2018-07-26

## 2018-06-05 NOTE — Telephone Encounter (Addendum)
[facility] is following up on refill request for fasomax.  Per [facility] this is the 3rd request.  [facility] can be reach at [phone]    Requested Prescriptions     Signed Prescriptions Disp Refills   . alendronate (FOSAMAX) 70 MG tablet 4 tablet 1     Sig: Take 1 tablet (70 mg) by mouth every 7 days.     Authorizing Provider: Gwyndolyn Saxon

## 2018-06-09 ENCOUNTER — Encounter: Payer: Self-pay | Admitting: Medical Oncology

## 2018-06-09 DIAGNOSIS — Z923 Personal history of irradiation: Secondary | ICD-10-CM

## 2018-06-09 DIAGNOSIS — C50911 Malignant neoplasm of unspecified site of right female breast: Secondary | ICD-10-CM

## 2018-06-17 ENCOUNTER — Ambulatory Visit (INDEPENDENT_AMBULATORY_CARE_PROVIDER_SITE_OTHER): Payer: Medicare Other | Admitting: Hematology & Oncology

## 2018-06-17 ENCOUNTER — Encounter: Payer: Self-pay | Admitting: Hematology & Oncology

## 2018-06-17 VITALS — BP 134/73 | HR 72 | Temp 98.5°F | Resp 16 | Ht 65.0 in | Wt 202.0 lb

## 2018-06-17 DIAGNOSIS — M8589 Other specified disorders of bone density and structure, multiple sites: Secondary | ICD-10-CM

## 2018-06-17 DIAGNOSIS — C4361 Malignant melanoma of right upper limb, including shoulder: Secondary | ICD-10-CM

## 2018-06-17 DIAGNOSIS — C50911 Malignant neoplasm of unspecified site of right female breast: Principal | ICD-10-CM

## 2018-06-17 DIAGNOSIS — Z171 Estrogen receptor negative status [ER-]: Secondary | ICD-10-CM

## 2018-06-17 DIAGNOSIS — E559 Vitamin D deficiency, unspecified: Secondary | ICD-10-CM

## 2018-06-17 DIAGNOSIS — N183 Chronic kidney disease, stage 3 unspecified (CMS-HCC): Secondary | ICD-10-CM

## 2018-06-17 MED ORDER — LEVOTHYROXINE SODIUM 112 MCG OR TABS
112.00 ug | ORAL_TABLET | ORAL | Status: DC
Start: 2018-05-27 — End: 2023-02-12

## 2018-06-17 NOTE — Progress Notes (Signed)
Kayla Faster, MD, MBA    Associate Clinical Professor  [facility] Note  [name]  [address], [address], [place], [place] [address]  Tel.: [phone] Fax: [phone]      Date of Service:  06/17/2018    Kayla Joseph, Kayla Joseph 01/01/1946    REFERRING MD: Kayla Joseph  Radiation oncology: Kayla Joseph  PCP: Kayla Joseph  GYN: Kayla Joseph   Cardiology: Kayla Joseph    CHIEF COMPLAINT: Routine follow-up visit for right breast cancer and melanoma    HISTORY OF PRESENT ILLNESS:    72 y/o Caucasian woman with mammographically detected clinical stage IIA T2N0 right breast invasive ductal carcinoma s/p bx 01/14/14 (SBR 9/9, ER/PR negative, Ki-67=50%, Her-2/neu FISH ratio=1.14 but copy number=6.6 so positive). MRI breast 01/29/14 showed mass to be 2.2 x 1.6 x 2.0 cm. Staging PET/CT 01/27/14 was negative for distant mets; showed only known right breast tumor and incidental left thyroid nodule. Planned neoadjuvant systemic therapy: TCH + P x 6 cycles followed by surgery and radiation therapy. Cycle #1 TCH+P given 02/17/14. Pt developed severe diarrhea and dehydration causing electrolyte derangements requiring daily replacement therapies and hydration. Therefore, chemotherapy was changed to weekly Taxol for 12 weeks and Herceptin plus Perjeta every 3 weeks was continued. Taxol started 03/10/14 and completed only 8 weeks on 04/28/14 due to cumulative severe side effects and MRI breast 04/30/14 showing only 81m of residual enhancement. Herceptin/Perjeta continued q3wks for 6 total cycles. Right lumpectomy + SLNB done 05/28/14 (6 mm residual IDC, grade 2, SBR 7/9, no LI, 0/1 LN; ypT1bN0; residual tumor had lower a mitotic score compared to initial biopsy specimen likely due to chemotherapy effect; repeat ER and PR on residual tumor was again negative for both). Due to residual disease although minimal, pt received final dose (#6) of Perjeta 06/02/14. Herceptin single-agent q3wks  started 06/23/14.     July 14, 2014 (Herceptin week #22)     Started single-agent Herceptin 06/23/14 and tolerated it well without side effects. Hair is starting to grow. Neuropathy in the fingers and bottoms of the feet continue but improve when she takes gabapentin 300 mg 3 times a day. She does not want to increase the dose since it is controlling her neuropathy and she does not want to increase fatigue due to medication side effect. [place] but it was improved with more solid form [place] The only difference during that trip was that her friends were cooking all of her meals for her so a change in diet seems to have improved the diarrhea. Since her return, her diarrhea has resumed unchanged. She has about 6 watery episodes of diarrhea per day and has had 3 episodes thus far this morning. She continues Percocet as needed but also has a prior prescription for more tincture of opium which she will change back to since postop pain has resolved. She has not yet done repeat stool studies since she was on her trip but will do so soon with prior order. She has not seen the gastroenterologist yet but will ask her primary care doctor for a referral to see one soon. Dysuria symptoms recurred this past weekend. Bactrim worked well in the past after the 1st dose. She saw Dr PMarshall Cork9/4/15 and will start xrt next week on 07/21/14. She receives hydration every 3 weeks with Herceptin but requests more frequent visits since her diarrhea is ongoing. Labs (CBC,  CMP) 06/02/14 sig for H/H=10.6/32.9; gluc=221.   The patient completed the Herceptin every 3 weeks for total of 1 year of adjuvant therapy in 12-2014 and remains off therapy.     INTERVAL HISTORY:    The patient reports no new breast lesions. She had a colonoscopy within one year and it was negative. She remains on vitamin-D and calcium. She is followed by dermatology for melanoma  and had no new skin lesions.  She denies any bleeding.   She is also followed by cardiology and gets periodic ECHO's.  She is diabetic and is trying to control it better.      PAST MEDICAL HISTORY:     Reviewed and no changes  Stage IIA T2N0 right breast IDC --> ypT1bN0   LUE DVT dx'ed by U/S 02/10/14 -- Lovenox started same day; resolved on 05/18/14 U/S; completed Lovenox 06/07/14   Stage I pT1aN0 melanoma of upper back s/p excision  DM II  Hyperlipidemia  Hypertension  BCC skin  Thyroid nodules bilaterally       PAST SURGICAL HISTORY:     Reviewed and no changes   05/28/14 Right lumpectomy + SLNB   02/05/14 L. chest port placement ([facility])  07/04/11 Wide excision upper back melanoma + R. supraclavicular SLNB (lentigo melanoma and melanoma in situ, 0.75 mm, no residual invasive melanoma, 0/1 LN)   2013 Facelift  2012 Gastric bypass  1999 Cholecystectomy  1995 Left foot fracture repair with pins  Age 72 Tonsillectomy     PAST OB/GYN HISTORY:     Reviewed and no changes   G0P0. Menarche at 50. OCP x 15 yrs; last age 60s. HRT late 38s for <6mhs; stopped after [facility] study results reported. LMP age 72      HEALTH MAINTENANCE:    Colo -- 3-4 yrs ago; repeat due next year   DXA -- 2015 osteopenia; prescribed Fosamax but never started   Pap -- 11/2013 (one prior abnl pap and polyp removed)      FAMILY HISTORY:    Reviewed and no changes   Mother -- died 887of CHF  Father -- died 821of decline after stroke  Sister -- died 416of NHL; another sister alive and well     SOCIAL HISTORY:    Lives with partner, FQuintella Joseph   Kids -- none   Retired [employer] developer   Tobacco -- only 4 yrs in early 20s, <1ppd  Alcohol - 1 glass wine/mth at most   Drugs - none      CURRENT MEDS:  Please see below and I reviewed the medication list with the patient.    ALLERGIES:   No Known Allergies    REVIEW OF SYSTEMS (ROS):  A comprehensive 15-point ROS was  performed and reviewed with the patient and is negative unless noted above.     EXAM:    Vitals 12/04/2016 06/04/2017 12/03/2017 80/06/3817  Systolic [id number]  Diastolic 74 72 75 73   Position Sitting Sitting Sitting Sitting   Site Right arm Right arm Right arm Left arm   Cuff Size Regular Regular Regular Regular   Pulse 98 - 72 72   Resp 16 16 16 16    Temp 97.8 97.3 97.4 98.5   Temp Source 101 101 5 1   Weight (kg) 92.5 kg 83.008 kg 82.918 kg 91.627 kg   Weight (lbs) 203 lb 14.8 oz 183 lb 182 lb 12.8 oz 202 lb   Height (  cm) 165.1 cm 165.1 cm 165.1 cm 165.1 cm   Height (ft in) 5' 5"  5' 5"  5' 5"  5' 5"    BMI (kg/m2) 33.93 kg/m2 30.45 kg/m2 30.42 kg/m2 33.61 kg/m2   BSA (m2) 2.06 m2 1.95 m2 1.95 m2 2.05 m2   Fall Risk No fall risk identified No fall risk identified No fall risk identified No fall risk identified   Some recent data might be hidden     GENERAL: The patient is well developed and well nourished, ambulatory, in no acute distress.  HEENT: normocephalic/atraumatic, anicteric sclera.  HEART: S1, S2, regular rate and rhythm.  CHEST: Clear to auscultation bilaterally.  AXILLAE: No palpable lesions bilaterally.  BREASTS: Were examined bilaterally, no new palpable breast lesions, no nipple discharge bilaterally.  ABDOMEN: soft, non-tender, non-distended, normal bowel sounds, no rebound or guarding, no appreciable masses  EXTREMITIES: No edema of the lower extremities.  SKIN: no petechiae or rash, no new skin lesions and complete skin exam was not performed.  NEURO: AOx3, EOMI, no focal deficits, gait steady.  PSYCHIATRIC: Good insight adequate mood and affect.    LAB DATA:    Reviewed the lab work with patient:    May 19, 2018 normal comprehensive metabolic panel except glucose 129, BUN 3, 1 point, hemoglobin A1c 6.7%.  TSH 11.51.    October 31, 2017 WBC count 5.1, hemoglobin 11.9, hematocrit 36.4, platelets of 202000, normal comprehensive metabolic panel except glucose 103, BUN 28, creatinine 1.3, total  bilirubin 1.3, hemoglobin A1c 6.2%, TSH 0.33.    Results for EMALY, BOSCHERT (MRN [id number]) as of 12/03/2017      Ref. Range 08/24/2015 09:54 11/18/2015 09:37 02/17/2016 08:50 06/14/2016 09:33 05/28/2017 09:30   Sodium Latest Ref Range: 135 - 146 mmol/L 139 139 138 137 138   Potassium Latest Ref Range: 3.5 - 5.3 mmol/L 5.7 (H) 5.1 4.9 4.7 5.4 (H)   Chloride Latest Ref Range: 98 - 110 mmol/L 108 106 104 101 107   Carbon Dioxide Latest Ref Range: 20 - 32 mmol/L 21 24 25 25 26    BUN Latest Ref Range: 7 - 25 mg/dL 21 23 30  (H) 29 (H) 21   Creatinine Latest Ref Range: 0.60 - 0.93 mg/dL 1.19 (H) 1.16 (H) 1.48 (H) 1.53 (H) 1.24 (H)   eGFR non-Afr.American Latest Ref Range: > OR = 60 mL/min/1.25m 46 (L) 47 (L) 35 (L) 34 (L) 43 (L)   eGFR African American Latest Ref Range: > OR = 60 mL/min/1.752m53 (L) 55 (L) 41 (L) 39 (L) 50 (L)   Glucose Latest Ref Range: 65 - 99 mg/dL 172 (H) 148 (H) 166 (H) 226 (H) 103 (H)   Calcium Latest Ref Range: 8.6 - 10.4 mg/dL 8.9 8.7 8.6 8.8 9.5   Total Protein Latest Ref Range: 6.1 - 8.1 g/dL 6.6 6.7 6.6 6.9 6.7   Alkaline Phos Latest Ref Range: 33 - 130 U/L 136 (H) 126 98 116 83   AST (SGOT) Latest Ref Range: 10 - 35 U/L 16 15 18 12 13    ALT (SGPT) Latest Ref Range: 6 - 29 U/L 9 11 15 8 9    Bilirubin, Total Latest Ref Range: 0.2 - 1.2 mg/dL 0.9 0.8 0.7 0.6 1.0   Albumin Latest Ref Range: 3.6 - 5.1 g/dL 4.0 4.1 3.9 4.1 4.1   BUN/Creatinine Ratio Latest Ref Range: 6 - 22 (calc) 18 20 20 19 17    [place] 27.29 Latest Ref Range: <38 U/mL 8 21 18 19 14    Cholesterol  Latest Ref Range: <200 mg/dL     112   LDL-Cholesterol Latest Units: mg/dL (calc)     41   HDL Cholesterol Latest Ref Range: >50 mg/dL     50 (L)   Chol/HDLC Ratio Latest Ref Range: <5.0 (calc)     2.2   Non-HDL Cholesterol Latest Ref Range: <130 mg/dL (calc)     62   Ferritin Latest Ref Range: 20 - 288 ng/mL   7 (L) 11 (L) 16 (L)   Folate Latest Units: ng/mL     11.8   Globulin Latest Ref Range: 1.9 - 3.7 g/dL (calc) 2.6 2.6 2.7 2.8 2.6      Albumin/Glob Ratio Latest Ref Range: 1.0 - 2.5 (calc) 1.5 1.6 1.4 1.5 1.6   Hgb A1C Latest Ref Range: <5.7 % of total Hgb     6.0 (H)   Iron Latest Ref Range: 45 - 160 mcg/dL   30 (L) 60 115   IIBC Latest Ref Range: 250 - 450 mcg/dL (calc)   470 (H) 422 393   Iron Saturation Latest Ref Range: 11 - 50 % (calc)   6 (L) 14 29   Triglycerides Latest Ref Range: <150 mg/dL     129   Vitamin B12 Latest Ref Range: 200 - 1,100 pg/mL   191 (L) 677 611   Vitamin D, 25-OH, Total Latest Ref Range: 30 - 100 ng/mL   32 26 (L) 40   WBC Latest Ref Range: 3.8 - 10.8 Thousand/uL 5.9 6.4 5.2 6.6 5.3   RBC Latest Ref Range: 3.80 - 5.10 Million/uL 3.50 (L) 3.36 (L) 3.27 (L) 3.71 (L) 3.57 (L)   HGB Latest Ref Range: 11.7 - 15.5 g/dL 9.8 (L) 9.6 (L) 9.1 (L) 10.5 (L) 11.5 (L)   HCT Latest Ref Range: 35.0 - 45.0 % 31.1 (L) 29.0 (L) 28.1 (L) 32.4 (L) 33.3 (L)   MCV Latest Ref Range: 80.0 - 100.0 fL 88.9 86.3 85.9 87.3 93.3   MCH Latest Ref Range: 27.0 - 33.0 pg 28.2 28.4 27.8 28.3 32.2   MCHC Latest Ref Range: 32.0 - 36.0 g/dL 31.6 (L) 32.9 32.4 32.4 34.5   RDW Latest Ref Range: 11.0 - 15.0 % 14.8 13.8 13.2 14.4 11.9   PLT Latest Ref Range: 140 - 400 Thousand/uL 257 224 218 201 179   MPV Latest Ref Range: 7.5 - 12.5 fL 7.8 7.5 9.2 9.4 9.5   SEGS Latest Units: % 64.4 64.0 59.7 55 54.7   Lymps Latest Units: % 29.5 29.0 31.7 32.9 36.7   Monocytes Latest Units: % 5.4 6.3 7.4 8.3 7.3   Eosinophils Latest Units: % 0.5 0.5 1.0 3.5 0.9   Basophils Latest Units: % 0.2 0.2 0.2 0.3 0.4   BANDS Latest Units: %     CANCELED   Metamyelocytes Latest Units: %     CANCELED   Myelocytes Latest Units: %     CANCELED   Promyelocytes Latest Units: %     CANCELED   Blasts Latest Units: %     CANCELED   Reactive Lymphs Latest Ref Range: 0 - 10 %     CANCELED   NRBC Latest Ref Range: 0 /100 WBC     CANCELED   Abs Neutrophils Latest Ref Range: 1,500 - 7,800 cells/uL 3,800 4,096 3,104 3,630 2,899   Abs Lymphs Latest Ref Range: 850 - 3,900 cells/uL 1,741 1,856 1,648  2,171 1,945   Abs Monocytes Latest Ref Range: 200 - 950 cells/uL 319 403  385 548 387   Abs Eosinophils Latest Ref Range: 15 - 500 cells/uL 30 32 52 231 48   Abs Basophils Latest Ref Range: 0 - 200 cells/uL 12 13 10 20 21    Abs Band Neutrophils Latest Ref Range: 0 - 750 cells/uL     CANCELED   Abs Metamyelocytes Latest Ref Range: 0 cells/uL     CANCELED   Abs Myelocytes Latest Ref Range: 0 cells/uL     CANCELED   Abs Promyelocytes Latest Ref Range: 0 cells/uL     CANCELED   Abs Blasts Latest Ref Range: 0 cells/uL     CANCELED   Abs NRBC Latest Ref Range: 0 cells/uL   0 0 0   Comments Unknown     CANCELED   Retic %, Auto Latest Units: %   1.2     Retic Absolute Latest Ref Range: 20,000 - 80,000 cells/uL   39,240         11-26-2016 hemoglobin A1c percent, normal comprehensive metabolic panel except glucose 122, creatinine 1.25, normal liver function profile, hemoglobin 11.5, hematocrit 35.4.  B 12  is 559.  10/11/2016 she count 6.1, hemoglobin 10.8, hematocrit 32.7, platelet count of 227000 normal differential, ferritin 10, normal comprehensive metabolic panel except glucose 230, BUN 35, creatinine 1.56, calcium 8.5, hemoglobin A1c 11.1%.  Vitamin D 22 ng/mL.  June 14, 2016 WBC count 6.6, hemoglobin 10.5, hematocrit 32.4, platelet count of 201,000, normal differential, normal comprehensive metabolic panel except glucose 226, BUN 29, creatinine 1.53, normal liver function profile, vitamin-D 26 ng/mL, ferritin 11, B12 is 677, [place] 27-29 is 19.  02/17/2016 WBC count 5.2, hemoglobin 9.1, hematocrit 28.1, platelet count of 218000, normal differential, normal comprehensive metabolic panel except glucose of 166, BUN 30, creatinine 1.48, normal liver function profile, absolute retic count 39,240. Ferritin 7, B12 is 191. [place] 27-29 is 18. The vitamin-D is 32 ng/mL.  11/18/2015 WBC count 6.4, hemoglobin 9.6, hematocrit 29, platelet count of 224000, normal differential, normal comprehensive metabolic panel except glucose 148,  creatinine 1.16, normal liver function profile, [place] 27-29 is 21.  August 03, 2015 creatinine 1.31,TSH 4.54, vitamin-D 23 ng/mL.  May 20, 2015 WBC count 5.3, hemoglobin 10.1, hematocrit 31.4, platelets of 224000 normal diff, [place] 27-29 is 16, Vit D 31 ng/mL normal comprehensive metabolic panel except glucose 156, creatinine 1.19, alk-phos 137.  12/27/2014 vitamin-D 34 ng/ml.  12/23/2014 normal basic metabolic panel except calcium 7.8 and glucose 103.  White Blood Cell Count: 7.0 THOUS/MCL [06-02-14 10:35]  Hemoglobin: 10.6 G/DL [06-02-14 10:35]  Hematocrit: 32.9 % [06-02-14 10:35]  Platelet Count: 290 THOUS/MCL [06-02-14 10:35]  RBC: 3.33 MILL/MCL [06-02-14 10:35]  MCV: 98.8 FL [06-02-14 10:35]  MCH: 31.8 PG [06-02-14 10:35]  MCHC: 32.2 G/DL [06-02-14 10:35]  RDW-CV: 13.6 % [06-02-14 10:35]  Neutrophils: 3.9 THOUS/MCL [06-02-14 10:35]  Lymphocyte: 2.5 THOUS/MCL [06-02-14 10:35]  Monocyte: 0.5 THOUS/MCL [06-02-14 10:35]  Eosinophil: 0.1 THOUS/MCL [06-02-14 10:35]  Basophil: 0.0 THOUS/MCL [06-02-14 10:35]  RBC Morphology: NO RBC ABNORMALITIES DETECTED BY AUTOMATED ANALYSIS. [06-02-14 10:35]  Plt Morph/Comm: DIFFERENTIAL PERFORMED BY AUTOMATED ANALYSIS. NO PLATELET ABNORMALITIES DETECTED BY AUTOMATED ANALYSIS. [06-02-14 10:35]  Sodium: 136 mmol/L [06-02-14 10:35]  Potassium: 4.2 mmol/L [06-02-14 10:35]  Chloride: 107 mmol/L [06-02-14 10:35]  CO2: 21 mmol/L [06-02-14 10:35]  Electrolyte Balance: 8 mmol/L [06-02-14 10:35]  BUN: 12 mg/dL [06-02-14 10:35]  Creatinine: 0.9 mg/dL [06-02-14 10:35]  Glucose: 221 mg/dL [06-02-14 10:35]  Calcium: 9.0 mg/dL [06-02-14 10:35]  Protein, Total: 6.2 G/DL [06-02-14 10:35]  Albumin:  3.7 G/DL [06-02-14 10:35]  Alkaline Phosphatase: 81 U/L [06-02-14 10:35]  Bilirubin, Total: 0.9 mg/dL [06-02-14 10:35]  AST: 22 U/L [06-02-14 10:35]  ALT: 16 U/L [06-02-14 10:35]No data available for the last 5 years    IMAGING DATA:    Reviewed the imaging with patient:    June 03, 2017 bone density shows  osteopenia T-score of -2.4 in the left femoral neck.    06-03-2017 Mammogram showed Stable post lumpectomy scarring and radiation therapy change in the right breast is benign.    Suggest this patient return for her routine annual screening mammogram in 1 year.    ACR BI-RADS Category 2 -Benign Finding.    05/24/2016 bilateral diagnostic 2D and 3D mammogram shows stable post lumpectomy changes in the right breast are benign.  December 27, 2015 echocardiogram shows a normal ejection fraction.  05-24-2015 bilateral diagnostic mammogram and bilateral breast ultrasound showed new post lumpectomy scar in the right breast is benign, stable punctate calcifications in the area areolar region of the right breast are benign, stable punctate calcifications in the left breast at 6 o'clock are benign with no evidence of malignancy.  11/22/2014 bilateral 2D and 3D mammogram shows new punctate calcifications in the periareolar region of the right breast are probably benign in mammographic follow-up in 6 months is recommended, new post lumpectomy scar in the right breast is benign. New punctate calcifications in the left breast at 6 o'clock up probably benign.  11/22/2014 bone density shows osteoporosis with a T-score of-2.6 in the right femoral neck.  August 31, 2014 echocardiogram shows left ventricular diastolic dysfunction, normal left ventricular size and systolic function.    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  09/27/2014 left thyroid nodule atypical follicular lesion of undetermined significance, a right thyroid nodule fine-needle aspiration negative for malignancy.    IMPRESSION/PLAN:    Problem # 1: BREAST CANCER (ICD-174.9)    Initial clinical stage IIA pT2N0 right IDC s/p bx 01/14/14 (ER/PR negative, Her-2 positive by copy number = 6.6, Ki-67=50%). MRI  breast 01/29/14 showed lesion to be 2.2 x 1.6 x 2.0 cm. Staging PET/CT negative for distant mets. Neoadjuvant chemotherapy with Her-2 targeted therapy was suggested, since tumor size > 2cm, so recommended TCH + P (Taxotere, Carboplatin, Herceptin, Perjeta) q3wks x 6 cycles. Systemic therapies started 02/17/14 but pt developed severe diarrhea causing dehydration and electrolyte abnormalities requiring daily hydration and electrolyte replacements so changed regimen to weekly Taxol x 12 weeks with Herceptin/Perjeta q3wks and tolerated only slightly better. Right breast mass resolved on physical exam. Pt was becoming weaker due to ongoing diarrhea on Taxol so discontinued after 04/28/14 dose with plan for repeat breast imaging followed by surgery if response was good. Breast imaging 04/28/14 (mammo, u/s) and MRI 04/30/14 showed minimal residual disease so Taxol was discontinued after 04/28/14 dose (week #8) and Herceptin/Perjeta continued. Right lumpectomy + SLNB 05/28/14 showed 6 mm residual disease, grade 2, SBR=7/9 (lower mitotic rate than bx), 0/1 LN; repeat ER/PR negative. Since only 5 doses of Perjeta were given pre-operatively and there was residual disease in surgical specimen, she was given the final 6th dose of Perjeta 06/02/14 with usual q3wk dose of Herceptin. Single-agent Herceptin continued q3wk and completed a 1-yr course in 12-2014 and since then no therapy. The radiation therapy was completed. Last mammogram reviewed and negative and next one  planned for August of 2019 and  discussed again signs and symptoms suggestive of recurrent disease. Clinically no evidence of any recurrent disease and I will continue to monitor.    Problem # 2: ACUTE DVT OF UPPER EXTREMITY (ICD-453.82)    LUE acute DVT dx'ed by u/s 02/10/14 due to sudden onset of left arm swelling, completed anticoagulation and no evidence of recurrent thrombosis. May consider to remove PORT was concerned about chance to develop DVT but advised that  removing the PORT does not usually cause a DVT.    Problem # 3: ABNORMAL ECHO (ICD-793.2)    Baseline ECHO 02/10/14 with normal EF of 65-70% but with mild diastolic dysfunction. I advised her to continue f/u with Dr. Reubin Milan for monitoring of cardiac function and recommended again a repeat ECHO.    Problem # 4: THYROID NODULE (ICD-241)     She had a total thyroidectomy and no malignancy found. Follow-up with Endocrinology.    PROBLEM # 5 OSTEOPOROSIS    Repeat bone density was reviewed now she has osteopenia and remains on Fosamax and also vitamin-D and calcium.    PROBLEM # 6 MELANOMA    No new complaints and followed by dermatology.    PLAN:    1) Completed single-agent Herceptin q3wks to complete 4yrtotal course (06/23/14 thru 01/19/15).  Repeat mammogram 05-2018 done and reviewed and to be done yearly.  2) Anemia mild and now likely due to the chronic renal insufficiency but stable.  3) ECHO to be repeated as per Dr. RReubin Milanof cardiology.  4) Thyroidectomy done due to atypical left thyroid nodule and no malignancy, follow-up with Endocrinology.  5) Renal insufficiency and diabetes may contribute to anemia and advised her to stay well hydrated and follow-up with nephrology.  Her creatinine is stable.  6) History of melanoma and she is followed by Dermatology.    Orders Placed This Encounter   Procedures   . [place] 27.29, BLOOD   . CBC w/ Diff [place]   . Comprehensive Metabolic Panel, Plasma   . Vitamin D, 25-Hydroxy, Blood Gel-Barrier Tube       Thank you very much for allowing our continued participation in the care of this patient.     More than 50% of this 30 min visit was spent in educating the patient about their condition, discussing compliance issues, counseling including answering all of the patients questions and coordination of care. This included review of relevant laboratory, radiology and other diagnostic tests, explaining medical management choices and the patient verbalized understanding. All  risks, benefits, and alternatives were discussed in detail with the patient and the patient wished to proceed with the suggested treatment plan.     RTO: 4-6 months    I certify that I have reviewed the documentation contained in this clinical record and that it is accurately recorded. This document contains private and confidential health information protected by state and federal law and any release of this information requires the written prior authorization of the above mentioned patient.    Please note this report was dictated with the use of voice recognition software.  It may contain inadvertent spelling or grammatical errors which were not detected during the editing process.       Take a virtual tour of our facility: [email or web address]   Clinical trials: [email or web address]       --------------------------------------  ABarbaraann Faster MD, MBA

## 2018-06-17 NOTE — Progress Notes (Signed)
Kayla Faster, MD, MBA    Associate Clinical Professor  [facility] Note  [place]  [address], [place], [place] [address]  Tel.: [phone] Fax: [phone]      Date of Service:  06/17/2018    Kayla Joseph, Kayla Joseph 1946-09-21    REFERRING MD: Kayla Joseph  Radiation oncology: Kayla Joseph  PCP: Kayla Joseph  GYN: Kayla Joseph   Cardiology: Kayla Joseph    CHIEF COMPLAINT: Routine follow-up visit for right breast cancer and melanoma    HISTORY OF PRESENT ILLNESS:    72 y/o Caucasian woman with mammographically detected clinical stage IIA T2N0 right breast invasive ductal carcinoma s/p bx 01/14/14 (SBR 9/9, ER/PR negative, Ki-67=50%, Her-2/neu FISH ratio=1.14 but copy number=6.6 so positive). MRI breast 01/29/14 showed mass to be 2.2 x 1.6 x 2.0 cm. Staging PET/CT 01/27/14 was negative for distant mets; showed only known right breast tumor and incidental left thyroid nodule. Planned neoadjuvant systemic therapy: TCH + P x 6 cycles followed by surgery and radiation therapy. Cycle #1 TCH+P given 02/17/14. Pt developed severe diarrhea and dehydration causing electrolyte derangements requiring daily replacement therapies and hydration. Therefore, chemotherapy was changed to weekly Taxol for 12 weeks and Herceptin plus Perjeta every 3 weeks was continued. Taxol started 03/10/14 and completed only 8 weeks on 04/28/14 due to cumulative severe side effects and MRI breast 04/30/14 showing only 43m of residual enhancement. Herceptin/Perjeta continued q3wks for 6 total cycles. Right lumpectomy + SLNB done 05/28/14 (6 mm residual IDC, grade 2, SBR 7/9, no LI, 0/1 LN; ypT1bN0; residual tumor had lower a mitotic score compared to initial biopsy specimen likely due to chemotherapy effect; repeat ER and PR on residual tumor was again negative for both). Due to residual disease although minimal, pt received final dose (#6) of Perjeta 06/02/14. Herceptin single-agent q3wks  started 06/23/14.     July 14, 2014 (Herceptin week #22)     Started single-agent Herceptin 06/23/14 and tolerated it well without side effects. Hair is starting to grow. Neuropathy in the fingers and bottoms of the feet continue but improve when she takes gabapentin 300 mg 3 times a day. She does not want to increase the dose since it is controlling her neuropathy and she does not want to increase fatigue due to medication side effect. She just returned from her trip to [place] Diarrhea continues but it was improved with more solid form during her Kayla Joseph. The only difference during that trip was that her friends were cooking all of her meals for her so a change in diet seems to have improved the diarrhea. Since her return, her diarrhea has resumed unchanged. She has about 6 watery episodes of diarrhea per day and has had 3 episodes thus far this morning. She continues Percocet as needed but also has a prior prescription for more tincture of opium which she will change back to since postop pain has resolved. She has not yet done repeat stool studies since she was on her trip but will do so soon with prior order. She has not seen the gastroenterologist yet but will ask her primary care doctor for a referral to see one soon. Dysuria symptoms recurred this past weekend. Bactrim worked well in the past after the 1st dose. She saw Dr PMarshall Cork9/4/15 and will start xrt next week on 07/21/14. She receives hydration every 3 weeks with Herceptin but requests more frequent visits since her diarrhea is ongoing. Labs (CBC,  CMP) 06/02/14 sig for H/H=10.6/32.9; gluc=221.   The patient completed the Herceptin every 3 weeks for total of 1 year of adjuvant therapy in 12-2014 and remains off therapy.     INTERVAL HISTORY:    The patient reports no new breast lesions. She had a colonoscopy < 1 year go and it was negative. She remains on vitamin-D and calcium. She had one episode of pains in the left breast  and it resolved and no trauma. She also reports that she had a melanoma and reports no new skin lesions and follows with dermatology.  She denies any bleeding.   She is also followed by cardiology and gets periodic ECHO's.  She is diabetic and is trying to control it better.      PAST MEDICAL HISTORY:     Reviewed and no changes  Stage IIA T2N0 right breast IDC --> ypT1bN0   LUE DVT dx'ed by U/S 02/10/14 -- Lovenox started same day; resolved on 05/18/14 U/S; completed Lovenox 06/07/14   Stage I pT1aN0 melanoma of upper back s/p excision  DM II  Hyperlipidemia  Hypertension  BCC skin  Thyroid nodules bilaterally       PAST SURGICAL HISTORY:     Reviewed and no changes   05/28/14 Right lumpectomy + SLNB   02/05/14 L. chest port placement ([facility])  07/04/11 Wide excision upper back melanoma + R. supraclavicular SLNB (lentigo melanoma and melanoma in situ, 0.75 mm, no residual invasive melanoma, 0/1 LN)   2013 Facelift  2012 Gastric bypass  1999 Cholecystectomy  1995 Left foot fracture repair with pins  Age 39 Tonsillectomy     PAST OB/GYN HISTORY:     Reviewed and no changes   G0P0. Menarche at 30. OCP x 15 yrs; last age 61s. HRT late 50s for <54mhs; stopped after [facility] study results reported. LMP age 89      HEALTH MAINTENANCE:    Colo -- 3-4 yrs ago; repeat due next year   DXA -- 2015 osteopenia; prescribed Fosamax but never started   Pap -- 11/2013 (one prior abnl pap and polyp removed)      FAMILY HISTORY:    Reviewed and no changes   Mother -- died 849of CHF  Father -- died 872of decline after stroke  Sister -- died 467of NHL; another sister alive and well     SOCIAL HISTORY:    Lives with partner, Kayla Joseph   Kids -- none   Retired [employer] developer   Tobacco -- only 4 yrs in early 20s, <1ppd  Alcohol - 1 glass wine/mth at most   Drugs - none      CURRENT MEDS:  Please see below and I reviewed the medication list with the  patient.    ALLERGIES:   No Known Allergies    REVIEW OF SYSTEMS (ROS):  A comprehensive 15-point ROS was performed and reviewed with the patient and is negative unless noted above.     EXAM:    Vitals 12/04/2016 06/04/2017 12/03/2017 83/29/1916  Systolic [id number]  Diastolic 74 72 75 73   Position Sitting Sitting Sitting Sitting   Site Right arm Right arm Right arm Left arm   Cuff Size Regular Regular Regular Regular   Pulse 98 - 72 72   Resp 16 16 16 16    Temp 97.8 97.3 97.4 98.5   Temp Source 101 101 5 1   Weight (kg) 92.5 kg 83.008 kg 82.918  kg 91.627 kg   Weight (lbs) 203 lb 14.8 oz 183 lb 182 lb 12.8 oz 202 lb   Height (cm) 165.1 cm 165.1 cm 165.1 cm 165.1 cm   Height (ft in) 5' 5"  5' 5"  5' 5"  5' 5"    BMI (kg/m2) 33.93 kg/m2 30.45 kg/m2 30.42 kg/m2 33.61 kg/m2   BSA (m2) 2.06 m2 1.95 m2 1.95 m2 2.05 m2   Fall Risk No fall risk identified No fall risk identified No fall risk identified No fall risk identified   Some recent data might be hidden     GENERAL: The patient is well developed and well nourished, ambulatory, in no acute distress.  HEENT: normocephalic/atraumatic, anicteric sclera.  HEART: S1, S2, regular rate and rhythm.  CHEST: Clear to auscultation bilaterally.  AXILLAE: No palpable lesions bilaterally.  BREASTS: Were examined bilaterally, no new palpable breast lesions, no nipple discharge bilaterally.  ABDOMEN: soft, non-tender, non-distended, normal bowel sounds, no rebound or guarding, no appreciable masses  EXTREMITIES: No edema of the lower extremities.  SKIN: no petechiae or rash, no new skin lesions and complete skin exam was not performed.  NEURO: AOx3, EOMI, no focal deficits, gait steady.  PSYCHIATRIC: Good insight adequate mood and affect.    LAB DATA:    Reviewed the lab work with patient:        October 31, 2017 WBC count 5.1, hemoglobin 11.9, hematocrit 36.4, platelets of 202000, normal comprehensive metabolic panel except glucose 103, BUN 28, creatinine 1.3, total bilirubin  1.3, hemoglobin A1c 6.2%, TSH 0.33.    Results for KRYSTA, BLOOMFIELD (MRN [id number]) as of 12/03/2017      Ref. Range 08/24/2015 09:54 11/18/2015 09:37 02/17/2016 08:50 06/14/2016 09:33 05/28/2017 09:30   Sodium Latest Ref Range: 135 - 146 mmol/L 139 139 138 137 138   Potassium Latest Ref Range: 3.5 - 5.3 mmol/L 5.7 (H) 5.1 4.9 4.7 5.4 (H)   Chloride Latest Ref Range: 98 - 110 mmol/L 108 106 104 101 107   Carbon Dioxide Latest Ref Range: 20 - 32 mmol/L 21 24 25 25 26    BUN Latest Ref Range: 7 - 25 mg/dL 21 23 30  (H) 29 (H) 21   Creatinine Latest Ref Range: 0.60 - 0.93 mg/dL 1.19 (H) 1.16 (H) 1.48 (H) 1.53 (H) 1.24 (H)   eGFR non-Afr.American Latest Ref Range: > OR = 60 mL/min/1.17m 46 (L) 47 (L) 35 (L) 34 (L) 43 (L)   eGFR African American Latest Ref Range: > OR = 60 mL/min/1.715m53 (L) 55 (L) 41 (L) 39 (L) 50 (L)   Glucose Latest Ref Range: 65 - 99 mg/dL 172 (H) 148 (H) 166 (H) 226 (H) 103 (H)   Calcium Latest Ref Range: 8.6 - 10.4 mg/dL 8.9 8.7 8.6 8.8 9.5   Total Protein Latest Ref Range: 6.1 - 8.1 g/dL 6.6 6.7 6.6 6.9 6.7   Alkaline Phos Latest Ref Range: 33 - 130 U/L 136 (H) 126 98 116 83   AST (SGOT) Latest Ref Range: 10 - 35 U/L 16 15 18 12 13    ALT (SGPT) Latest Ref Range: 6 - 29 U/L 9 11 15 8 9    Bilirubin, Total Latest Ref Range: 0.2 - 1.2 mg/dL 0.9 0.8 0.7 0.6 1.0   Albumin Latest Ref Range: 3.6 - 5.1 g/dL 4.0 4.1 3.9 4.1 4.1   BUN/Creatinine Ratio Latest Ref Range: 6 - 22 (calc) 18 20 20 19 17    Barber 27.29 Latest Ref Range: <38 U/mL 8 21 18 19  14  Cholesterol Latest Ref Range: <200 mg/dL     112   LDL-Cholesterol Latest Units: mg/dL (calc)     41   HDL Cholesterol Latest Ref Range: >50 mg/dL     50 (L)   Chol/HDLC Ratio Latest Ref Range: <5.0 (calc)     2.2   Non-HDL Cholesterol Latest Ref Range: <130 mg/dL (calc)     62   Ferritin Latest Ref Range: 20 - 288 ng/mL   7 (L) 11 (L) 16 (L)   Folate Latest Units: ng/mL     11.8   Globulin Latest Ref Range: 1.9 - 3.7 g/dL (calc) 2.6 2.6 2.7 2.8 2.6   Albumin/Glob Ratio  Latest Ref Range: 1.0 - 2.5 (calc) 1.5 1.6 1.4 1.5 1.6   Hgb A1C Latest Ref Range: <5.7 % of total Hgb     6.0 (H)   Iron Latest Ref Range: 45 - 160 mcg/dL   30 (L) 60 115   IIBC Latest Ref Range: 250 - 450 mcg/dL (calc)   470 (H) 422 393   Iron Saturation Latest Ref Range: 11 - 50 % (calc)   6 (L) 14 29   Triglycerides Latest Ref Range: <150 mg/dL     129   Vitamin B12 Latest Ref Range: 200 - 1,100 pg/mL   191 (L) 677 611   Vitamin D, 25-OH, Total Latest Ref Range: 30 - 100 ng/mL   32 26 (L) 40   WBC Latest Ref Range: 3.8 - 10.8 Thousand/uL 5.9 6.4 5.2 6.6 5.3   RBC Latest Ref Range: 3.80 - 5.10 Million/uL 3.50 (L) 3.36 (L) 3.27 (L) 3.71 (L) 3.57 (L)   HGB Latest Ref Range: 11.7 - 15.5 g/dL 9.8 (L) 9.6 (L) 9.1 (L) 10.5 (L) 11.5 (L)   HCT Latest Ref Range: 35.0 - 45.0 % 31.1 (L) 29.0 (L) 28.1 (L) 32.4 (L) 33.3 (L)   MCV Latest Ref Range: 80.0 - 100.0 fL 88.9 86.3 85.9 87.3 93.3   MCH Latest Ref Range: 27.0 - 33.0 pg 28.2 28.4 27.8 28.3 32.2   MCHC Latest Ref Range: 32.0 - 36.0 g/dL 31.6 (L) 32.9 32.4 32.4 34.5   RDW Latest Ref Range: 11.0 - 15.0 % 14.8 13.8 13.2 14.4 11.9   PLT Latest Ref Range: 140 - 400 Thousand/uL 257 224 218 201 179   MPV Latest Ref Range: 7.5 - 12.5 fL 7.8 7.5 9.2 9.4 9.5   SEGS Latest Units: % 64.4 64.0 59.7 55 54.7   Lymps Latest Units: % 29.5 29.0 31.7 32.9 36.7   Monocytes Latest Units: % 5.4 6.3 7.4 8.3 7.3   Eosinophils Latest Units: % 0.5 0.5 1.0 3.5 0.9   Basophils Latest Units: % 0.2 0.2 0.2 0.3 0.4   BANDS Latest Units: %     CANCELED   Metamyelocytes Latest Units: %     CANCELED   Myelocytes Latest Units: %     CANCELED   Promyelocytes Latest Units: %     CANCELED   Blasts Latest Units: %     CANCELED   Reactive Lymphs Latest Ref Range: 0 - 10 %     CANCELED   NRBC Latest Ref Range: 0 /100 WBC     CANCELED   Abs Neutrophils Latest Ref Range: 1,500 - 7,800 cells/uL 3,800 4,096 3,104 3,630 2,899   Abs Lymphs Latest Ref Range: 850 - 3,900 cells/uL 1,741 1,856 1,648 2,171 1,945   Abs  Monocytes Latest Ref Range: 200 - 950 cells/uL 319 403 385  548 387   Abs Eosinophils Latest Ref Range: 15 - 500 cells/uL 30 32 52 231 48   Abs Basophils Latest Ref Range: 0 - 200 cells/uL 12 13 10 20 21    Abs Band Neutrophils Latest Ref Range: 0 - 750 cells/uL     CANCELED   Abs Metamyelocytes Latest Ref Range: 0 cells/uL     CANCELED   Abs Myelocytes Latest Ref Range: 0 cells/uL     CANCELED   Abs Promyelocytes Latest Ref Range: 0 cells/uL     CANCELED   Abs Blasts Latest Ref Range: 0 cells/uL     CANCELED   Abs NRBC Latest Ref Range: 0 cells/uL   0 0 0   Comments Unknown     CANCELED   Retic %, Auto Latest Units: %   1.2     Retic Absolute Latest Ref Range: 20,000 - 80,000 cells/uL   39,240         11-26-2016 hemoglobin A1c percent, normal comprehensive metabolic panel except glucose 122, creatinine 1.25, normal liver function profile, hemoglobin 11.5, hematocrit 35.4.  B 12  is 559.  10/11/2016 she count 6.1, hemoglobin 10.8, hematocrit 32.7, platelet count of 227000 normal differential, ferritin 10, normal comprehensive metabolic panel except glucose 230, BUN 35, creatinine 1.56, calcium 8.5, hemoglobin A1c 11.1%.  Vitamin D 22 ng/mL.  June 14, 2016 WBC count 6.6, hemoglobin 10.5, hematocrit 32.4, platelet count of 201,000, normal differential, normal comprehensive metabolic panel except glucose 226, BUN 29, creatinine 1.53, normal liver function profile, vitamin-D 26 ng/mL, ferritin 11, B12 is 677, [place] 27-29 is 19.  02/17/2016 WBC count 5.2, hemoglobin 9.1, hematocrit 28.1, platelet count of 218000, normal differential, normal comprehensive metabolic panel except glucose of 166, BUN 30, creatinine 1.48, normal liver function profile, absolute retic count 39,240. Ferritin 7, B12 is 191. [place] 27-29 is 18. The vitamin-D is 32 ng/mL.  11/18/2015 WBC count 6.4, hemoglobin 9.6, hematocrit 29, platelet count of 224000, normal differential, normal comprehensive metabolic panel except glucose 148, creatinine 1.16,  normal liver function profile, [place] 27-29 is 21.  August 03, 2015 creatinine 1.31,TSH 4.54, vitamin-D 23 ng/mL.  May 20, 2015 WBC count 5.3, hemoglobin 10.1, hematocrit 31.4, platelets of 224000 normal diff, [place] 27-29 is 16, Vit D 31 ng/mL normal comprehensive metabolic panel except glucose 156, creatinine 1.19, alk-phos 137.  12/27/2014 vitamin-D 34 ng/ml.  12/23/2014 normal basic metabolic panel except calcium 7.8 and glucose 103.  White Blood Cell Count: 7.0 THOUS/MCL [06-02-14 10:35]  Hemoglobin: 10.6 G/DL [06-02-14 10:35]  Hematocrit: 32.9 % [06-02-14 10:35]  Platelet Count: 290 THOUS/MCL [06-02-14 10:35]  RBC: 3.33 MILL/MCL [06-02-14 10:35]  MCV: 98.8 FL [06-02-14 10:35]  MCH: 31.8 PG [06-02-14 10:35]  MCHC: 32.2 G/DL [06-02-14 10:35]  RDW-CV: 13.6 % [06-02-14 10:35]  Neutrophils: 3.9 THOUS/MCL [06-02-14 10:35]  Lymphocyte: 2.5 THOUS/MCL [06-02-14 10:35]  Monocyte: 0.5 THOUS/MCL [06-02-14 10:35]  Eosinophil: 0.1 THOUS/MCL [06-02-14 10:35]  Basophil: 0.0 THOUS/MCL [06-02-14 10:35]  RBC Morphology: NO RBC ABNORMALITIES DETECTED BY AUTOMATED ANALYSIS. [06-02-14 10:35]  Plt Morph/Comm: DIFFERENTIAL PERFORMED BY AUTOMATED ANALYSIS. NO PLATELET ABNORMALITIES DETECTED BY AUTOMATED ANALYSIS. [06-02-14 10:35]  Sodium: 136 mmol/L [06-02-14 10:35]  Potassium: 4.2 mmol/L [06-02-14 10:35]  Chloride: 107 mmol/L [06-02-14 10:35]  CO2: 21 mmol/L [06-02-14 10:35]  Electrolyte Balance: 8 mmol/L [06-02-14 10:35]  BUN: 12 mg/dL [06-02-14 10:35]  Creatinine: 0.9 mg/dL [06-02-14 10:35]  Glucose: 221 mg/dL [06-02-14 10:35]  Calcium: 9.0 mg/dL [06-02-14 10:35]  Protein, Total: 6.2 G/DL [06-02-14 10:35]  Albumin: 3.7  G/DL [06-02-14 10:35]  Alkaline Phosphatase: 81 U/L [06-02-14 10:35]  Bilirubin, Total: 0.9 mg/dL [06-02-14 10:35]  AST: 22 U/L [06-02-14 10:35]  ALT: 16 U/L [06-02-14 10:35]No data available for the last 5 years    IMAGING DATA:    Reviewed the imaging with patient:    June 03, 2017 bone density shows osteopenia  T-score of -2.4 in the left femoral neck.    06-03-2017 Mammogram showed Stable post lumpectomy scarring and radiation therapy change in the right breast is benign.    Suggest this patient return for her routine annual screening mammogram in 1 year.    ACR BI-RADS Category 2 -Benign Finding.    05/24/2016 bilateral diagnostic 2D and 3D mammogram shows stable post lumpectomy changes in the right breast are benign.  December 27, 2015 echocardiogram shows a normal ejection fraction.  05-24-2015 bilateral diagnostic mammogram and bilateral breast ultrasound showed new post lumpectomy scar in the right breast is benign, stable punctate calcifications in the area areolar region of the right breast are benign, stable punctate calcifications in the left breast at 6 o'clock are benign with no evidence of malignancy.  11/22/2014 bilateral 2D and 3D mammogram shows new punctate calcifications in the periareolar region of the right breast are probably benign in mammographic follow-up in 6 months is recommended, new post lumpectomy scar in the right breast is benign. New punctate calcifications in the left breast at 6 o'clock up probably benign.  11/22/2014 bone density shows osteoporosis with a T-score of-2.6 in the right femoral neck.  August 31, 2014 echocardiogram shows left ventricular diastolic dysfunction, normal left ventricular size and systolic function.    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  09/27/2014 left thyroid nodule atypical follicular lesion of undetermined significance, a right thyroid nodule fine-needle aspiration negative for malignancy.    IMPRESSION/PLAN:    Problem # 1: BREAST CANCER (ICD-174.9)    Initial clinical stage IIA pT2N0 right IDC s/p bx 01/14/14 (ER/PR negative, Her-2 positive by copy number = 6.6, Ki-67=50%). MRI breast 01/29/14  showed lesion to be 2.2 x 1.6 x 2.0 cm. Staging PET/CT negative for distant mets. Neoadjuvant chemotherapy with Her-2 targeted therapy was suggested, since tumor size > 2cm, so recommended TCH + P (Taxotere, Carboplatin, Herceptin, Perjeta) q3wks x 6 cycles. Systemic therapies started 02/17/14 but pt developed severe diarrhea causing dehydration and electrolyte abnormalities requiring daily hydration and electrolyte replacements so changed regimen to weekly Taxol x 12 weeks with Herceptin/Perjeta q3wks and tolerated only slightly better. Right breast mass resolved on physical exam. Pt was becoming weaker due to ongoing diarrhea on Taxol so discontinued after 04/28/14 dose with plan for repeat breast imaging followed by surgery if response was good. Breast imaging 04/28/14 (mammo, u/s) and MRI 04/30/14 showed minimal residual disease so Taxol was discontinued after 04/28/14 dose (week #8) and Herceptin/Perjeta continued. Right lumpectomy + SLNB 05/28/14 showed 6 mm residual disease, grade 2, SBR=7/9 (lower mitotic rate than bx), 0/1 LN; repeat ER/PR negative. Since only 5 doses of Perjeta were given pre-operatively and there was residual disease in surgical specimen, she was given the final 6th dose of Perjeta 06/02/14 with usual q3wk dose of Herceptin. Single-agent Herceptin continued q3wk and completed a 1-yr course in 12-2014 and since then no therapy. The radiation therapy was completed. Last mammogram reviewed and negative and next one planned  for August of 2019 and  discussed again signs and symptoms suggestive of recurrent disease. Clinically no evidence of any recurrent disease and I will continue to monitor.    Problem # 2: ACUTE DVT OF UPPER EXTREMITY (ICD-453.82)    LUE acute DVT dx'ed by u/s 02/10/14 due to sudden onset of left arm swelling, completed anticoagulation and no evidence of recurrent thrombosis. May consider to remove PORT was concerned about chance to develop DVT but advised that removing the PORT  does not usually cause a DVT.    Problem # 3: ABNORMAL ECHO (ICD-793.2)    Baseline ECHO 02/10/14 with normal EF of 65-70% but with mild diastolic dysfunction. I advised her to continue f/u with Dr. Reubin Milan for monitoring of cardiac function and recommended again a repeat ECHO.    Problem # 4: THYROID NODULE (ICD-241)     She had a total thyroidectomy and no malignancy found. Follow-up with Endocrinology.    PROBLEM # 5 OSTEOPOROSIS    Repeat bone density was reviewed now she has osteopenia and remains on Fosamax and also vitamin-D and calcium.    PROBLEM # 6 MELANOMA    No new complaints and followed by dermatology.    PLAN:    1) Completed single-agent Herceptin q3wks to complete 59yrtotal course (06/23/14 thru 01/19/15).  Repeat mammogram 05-2018 planned.   2) Anemia due to severe iron and B12 deficiency, now no more anemia and continues on oral B12 supplementation and will monitor.  3) ECHO to be repeated as per Dr. RReubin Milanof cardiology.  4) Thyroidectomy done due to atypical left thyroid nodule and no malignancy, follow-up with Endocrinology.  5) Renal insufficiency and diabetes may contribute to anemia and advised her to stay well hydrated and follow-up with nephrology.  Her creatinine is stable.  6) History of melanoma and she is followed by Dermatology.    No orders of the defined types were placed in this encounter.    Thank you very much for allowing our continued participation in the care of this patient.     More than 50% of this 30 min visit was spent in educating the patient about their condition, discussing compliance issues, counseling including answering all of the patients questions and coordination of care. This included review of relevant laboratory, radiology and other diagnostic tests, explaining medical management choices and the patient verbalized understanding. All risks, benefits, and alternatives were discussed in detail with the patient and the patient wished to proceed with the  suggested treatment plan.     RTO: 4-6 months    I certify that I have reviewed the documentation contained in this clinical record and that it is accurately recorded. This document contains private and confidential health information protected by state and federal law and any release of this information requires the written prior authorization of the above mentioned patient.    Please note this report was dictated with the use of voice recognition software.  It may contain inadvertent spelling or grammatical errors which were not detected during the editing process.       Take a virtual tour of our facility: [email or web address]   Clinical trials: [email or web address]       --------------------------------------  ABarbaraann Faster MD, MBA

## 2018-07-26 ENCOUNTER — Other Ambulatory Visit: Payer: Self-pay | Admitting: Nurse Practitioner

## 2018-07-26 DIAGNOSIS — M8580 Other specified disorders of bone density and structure, unspecified site: Secondary | ICD-10-CM

## 2018-07-26 DIAGNOSIS — E538 Deficiency of other specified B group vitamins: Secondary | ICD-10-CM

## 2018-07-28 MED ORDER — ALENDRONATE SODIUM 70 MG OR TABS
ORAL_TABLET | ORAL | 0 refills | Status: DC
Start: 2018-07-28 — End: 2018-08-27

## 2018-07-28 MED ORDER — VITAMIN B-12 1000 MCG OR TABS
ORAL_TABLET | ORAL | 0 refills | Status: DC
Start: 2018-07-28 — End: 2018-08-27

## 2018-08-13 ENCOUNTER — Other Ambulatory Visit: Payer: Self-pay | Admitting: Nurse Practitioner

## 2018-08-13 DIAGNOSIS — G629 Polyneuropathy, unspecified: Secondary | ICD-10-CM

## 2018-08-13 MED ORDER — GABAPENTIN 300 MG OR CAPS
ORAL_CAPSULE | ORAL | 1 refills | Status: DC
Start: 2018-08-13 — End: 2018-11-14

## 2018-08-27 ENCOUNTER — Other Ambulatory Visit: Payer: Self-pay | Admitting: Nurse Practitioner

## 2018-08-27 DIAGNOSIS — M8580 Other specified disorders of bone density and structure, unspecified site: Secondary | ICD-10-CM

## 2018-08-27 DIAGNOSIS — E538 Deficiency of other specified B group vitamins: Secondary | ICD-10-CM

## 2018-08-28 MED ORDER — ALENDRONATE SODIUM 70 MG OR TABS
ORAL_TABLET | ORAL | 3 refills | Status: DC
Start: 2018-08-28 — End: 2018-12-25

## 2018-08-28 MED ORDER — VITAMIN B-12 1000 MCG OR TABS
ORAL_TABLET | ORAL | 3 refills | Status: DC
Start: 2018-08-28 — End: 2018-12-29

## 2018-11-14 ENCOUNTER — Other Ambulatory Visit: Payer: Self-pay | Admitting: Nurse Practitioner

## 2018-11-14 DIAGNOSIS — G629 Polyneuropathy, unspecified: Secondary | ICD-10-CM

## 2018-11-14 MED ORDER — GABAPENTIN 300 MG OR CAPS
ORAL_CAPSULE | ORAL | 0 refills | Status: DC
Start: 2018-11-14 — End: 2018-12-29

## 2018-12-17 ENCOUNTER — Ambulatory Visit (INDEPENDENT_AMBULATORY_CARE_PROVIDER_SITE_OTHER): Payer: Medicare Other | Admitting: Hematology & Oncology

## 2018-12-17 ENCOUNTER — Encounter: Payer: Self-pay | Admitting: Hematology & Oncology

## 2018-12-17 VITALS — BP 134/79 | HR 98 | Temp 98.1°F | Resp 18 | Ht 65.0 in | Wt 209.7 lb

## 2018-12-17 DIAGNOSIS — C50411 Malignant neoplasm of upper-outer quadrant of right female breast: Principal | ICD-10-CM

## 2018-12-17 DIAGNOSIS — Z171 Estrogen receptor negative status [ER-]: Secondary | ICD-10-CM

## 2018-12-17 DIAGNOSIS — M810 Age-related osteoporosis without current pathological fracture: Secondary | ICD-10-CM

## 2018-12-17 DIAGNOSIS — M8589 Other specified disorders of bone density and structure, multiple sites: Secondary | ICD-10-CM

## 2018-12-17 DIAGNOSIS — Z780 Asymptomatic menopausal state: Secondary | ICD-10-CM

## 2018-12-17 NOTE — Progress Notes (Signed)
Kayla Faster, MD, MBA    Associate Clinical Professor  [facility]  [place]  [address], [address], [place], [place] [address]  Tel.: [phone] Fax: [phone]      Date of Service:  12/17/2018    Kayla Joseph, Kayla Joseph 1946/05/09    REFERRING MD: Bonney Leitz  Radiation oncology: Marshall Cork  PCP: [name]  GYN: [name]   Cardiology: Gypsy Decant    CHIEF COMPLAINT: Routine follow-up visit for right breast cancer and melanoma    HISTORY OF PRESENT ILLNESS:    73 y/o Caucasian woman with mammographically detected clinical stage IIA T2N0 right breast invasive ductal carcinoma s/p bx 01/14/14 (SBR 9/9, ER/PR negative, Ki-67=50%, Her-2/neu FISH ratio=1.14 but copy number=6.6 so positive). MRI breast 01/29/14 showed mass to be 2.2 x 1.6 x 2.0 cm. Staging PET/CT 01/27/14 was negative for distant mets; showed only known right breast tumor and incidental left thyroid nodule. Planned neoadjuvant systemic therapy: TCH + P x 6 cycles followed by surgery and radiation therapy. Cycle #1 TCH+P given 02/17/14. Pt developed severe diarrhea and dehydration causing electrolyte derangements requiring daily replacement therapies and hydration. Therefore, chemotherapy was changed to weekly Taxol for 12 weeks and Herceptin plus Perjeta every 3 weeks was continued. Taxol started 03/10/14 and completed only 8 weeks on 04/28/14 due to cumulative severe side effects and MRI breast 04/30/14 showing only 59m of residual enhancement. Herceptin/Perjeta continued q3wks for 6 total cycles. Right lumpectomy + SLNB done 05/28/14 (6 mm residual IDC, grade 2, SBR 7/9, no LI, 0/1 LN; ypT1bN0; residual tumor had lower a mitotic score compared to initial biopsy specimen likely due to chemotherapy effect; repeat ER and PR on residual tumor was again negative for both). Due to residual disease although minimal, pt received final dose (#6) of Perjeta 06/02/14. Herceptin single-agent q3wks  started 06/23/14.     July 14, 2014 (Herceptin week #22)     Started single-agent Herceptin 06/23/14 and tolerated it well without side effects. Hair is starting to grow. Neuropathy in the fingers and bottoms of the feet continue but improve when she takes gabapentin 300 mg 3 times a day. She does not want to increase the dose since it is controlling her neuropathy and she does not want to increase fatigue due to medication side effect. [place] but it was improved with more solid form [place] The only difference during that trip was that her friends were cooking all of her meals for her so a change in diet seems to have improved the diarrhea. Since her return, her diarrhea has resumed unchanged. She has about 6 watery episodes of diarrhea per day and has had 3 episodes thus far this morning. She continues Percocet as needed but also has a prior prescription for more tincture of opium which she will change back to since postop pain has resolved. She has not yet done repeat stool studies since she was on her trip but will do so soon with prior order. She has not seen the gastroenterologist yet but will ask her primary care doctor for a referral to see one soon. Dysuria symptoms recurred this past weekend. Bactrim worked well in the past after the 1st dose. She saw Dr PMarshall Cork9/4/15 and will start xrt next week on 07/21/14. She receives hydration every 3 weeks with Herceptin but requests more frequent visits since her diarrhea is ongoing. Labs (CBC,  CMP) 06/02/14 sig for H/H=10.6/32.9; gluc=221.   The patient completed the Herceptin every 3 weeks for total of 1 year of adjuvant therapy in 12-2014 and remains off therapy.     INTERVAL HISTORY:    The patient reports no new breast lesions. She had a colonoscopy in 2018 and to be repeated in 2023. She remains on vitamin-D and calcium and weekly Alendronate for osteopenia and  tolerating it well and no reported dental issues. She is followed by dermatology for melanoma and had multiple skin lesions on her LUE from her puppy which were biopsied but not melanoma.  She denies any bleeding. She is also followed by cardiology and gets periodic [facility] and reports that the last ECHO was normal in 2019.       PAST MEDICAL HISTORY:     Reviewed and no changes  Stage IIA T2N0 right breast IDC --> ypT1bN0   LUE DVT dx'ed by U/S 02/10/14 -- Lovenox started same day; resolved on 05/18/14 U/S; completed Lovenox 06/07/14   Stage I pT1aN0 melanoma of upper back s/p excision  DM II  Hyperlipidemia  Hypertension  BCC skin  Thyroid nodules bilaterally       PAST SURGICAL HISTORY:     Reviewed and no changes   05/28/14 Right lumpectomy + SLNB   02/05/14 L. chest port placement ([facility] IR)  07/04/11 Wide excision upper back melanoma + R. supraclavicular SLNB (lentigo melanoma and melanoma in situ, 0.75 mm, no residual invasive melanoma, 0/1 LN)   2013 Facelift  2012 Gastric bypass  1999 Cholecystectomy  1995 Left foot fracture repair with pins  Age 26 Tonsillectomy     PAST OB/GYN HISTORY:     Reviewed and no changes   G0P0. Menarche at 73. OCP x 15 yrs; last age 43s. HRT late 77s for <65mhs; stopped after [facility] study results reported. LMP age [age]      HEALTH MAINTENANCE:    Colo -- 3-4 yrs ago; repeat due next year   DXA -- 2015 osteopenia; prescribed Fosamax but never started   Pap -- 11/2013 (one prior abnl pap and polyp removed)      FAMILY HISTORY:    Reviewed and no changes   Mother -- died 866of CHF  Father -- died 864of decline after stroke  Sister -- died 422of NHL; another sister alive and well     SOCIAL HISTORY:    Lives with partner, [name]   Kids -- none   Retired [employer] developer   Tobacco -- only 4 yrs in early 20s, <1ppd  Alcohol - 1 glass wine/mth at most   Drugs - none      CURRENT MEDS:  Please see below  and I reviewed the medication list with the patient.    ALLERGIES:   No Known Allergies    REVIEW OF SYSTEMS (ROS):  A comprehensive 15-point ROS was performed and reviewed with the patient and is negative unless noted above.     EXAM:    Vitals 06/04/2017 12/03/2017 06/17/2018 26/27/0350  Systolic [id number]  Diastolic 72 75 73 79   Position Sitting Sitting Sitting Sitting   Site Right arm Right arm Left arm Left arm   Cuff Size Regular Regular Regular -   Pulse - 72 72 98   Resp _0 Temp 97.3 97.4 98.5 98.1   Temp Source 101 _1 Weight (kg) 83.008 kg 82.918  kg 91.627 kg 95.1 kg   Weight (lbs) 183 lb 182 lb 12.8 oz 202 lb 209 lb 10.5 oz   Height (cm) 165.1 cm 165.1 cm 165.1 cm 165.1 cm   Height (ft in) 5' 5" 5' 5" 5' 5" 5' 5"   BMI (kg/m2) 30.45 kg/m2 30.42 kg/m2 33.61 kg/m2 34.89 kg/m2   BSA (m2) 1.95 m2 1.95 m2 2.05 m2 2.09 m2   Fall Risk No fall risk identified No fall risk identified No fall risk identified No fall risk identified   Some recent data might be hidden     GENERAL: The patient is well developed and well nourished, ambulatory, in no acute distress.  HEENT: normocephalic/atraumatic, anicteric sclera.  HEART: RRR.  CHEST: Clear to auscultation bilaterally.  AXILLAE: No palpable lesions bilaterally.  BREASTS: Were examined bilaterally, no new palpable breast lesions, no nipple discharge bilaterally.   ABDOMEN: soft, non-tender, non-distended, normal bowel sounds, no rebound or guarding, no appreciable masses  EXTREMITIES: No edema of the lower extremities.  SKIN: no petechiae or rash, no new skin lesions and complete skin exam was not performed.  NEURO: AOx3, EOMI, no focal deficits, gait steady.  PSYCHIATRIC: Good insight adequate mood and affect.    LAB DATA:    Reviewed the lab work with patient:    October 27, 2018 normal comprehensive metabolic panel except glucose 157 creatinine 1.27, normal liver function profile except total bili 1.3, hemoglobin A1c 7.5%.  May 19, 2018  normal comprehensive metabolic panel except glucose 129, BUN 3, 1 point, hemoglobin A1c 6.7%.  TSH 11.51.    October 31, 2017 WBC count 5.1, hemoglobin 11.9, hematocrit 36.4, platelets of 202000, normal comprehensive metabolic panel except glucose 103, BUN 28, creatinine 1.3, total bilirubin 1.3, hemoglobin A1c 6.2%, TSH 0.33.    Results for KRYSTENA, REITTER (MRN [id number]) as of 12/03/2017      Ref. Range 08/24/2015 09:54 11/18/2015 09:37 02/17/2016 08:50 06/14/2016 09:33 05/28/2017 09:30   Sodium Latest Ref Range: 135 - 146 mmol/L 139 139 138 137 138   Potassium Latest Ref Range: 3.5 - 5.3 mmol/L 5.7 (H) 5.1 4.9 4.7 5.4 (H)   Chloride Latest Ref Range: 98 - 110 mmol/L 108 106 104 101 107   Carbon Dioxide Latest Ref Range: 20 - 32 mmol/L _0 BUN Latest Ref Range: 7 - 25 mg/dL _1 (H) 29 (H) 21   Creatinine Latest Ref Range: 0.60 - 0.93 mg/dL 1.19 (H) 1.16 (H) 1.48 (H) 1.53 (H) 1.24 (H)   eGFR non-Afr.American Latest Ref Range: > OR = 60 mL/min/1.5m 46 (L) 47 (L) 35 (L) 34 (L) 43 (L)   eGFR African American Latest Ref Range: > OR = 60 mL/min/1.765m53 (L) 55 (L) 41 (L) 39 (L) 50 (L)   Glucose Latest Ref Range: 65 - 99 mg/dL 172 (H) 148 (H) 166 (H) 226 (H) 103 (H)   Calcium Latest Ref Range: 8.6 - 10.4 mg/dL 8.9 8.7 8.6 8.8 9.5   Total Protein Latest Ref Range: 6.1 - 8.1 g/dL 6.6 6.7 6.6 6.9 6.7   Alkaline Phos Latest Ref Range: 33 - 130 U/L 136 (H) 126 98 116 83   AST (SGOT) Latest Ref Range: 10 - 35 U/L _2 ALT (SGPT) Latest Ref Range: 6 - 29 U/L _3 Bilirubin, Total Latest Ref Range: 0.2 - 1.2 mg/dL 0.9 0.8 0.7 0.6 1.0   Albumin Latest  Ref Range: 3.6 - 5.1 g/dL 4.0 4.1 3.9 4.1 4.1   BUN/Creatinine Ratio Latest Ref Range: 6 - 22 (calc) _0 [place] 27.29 Latest Ref Range: <38 U/mL _1 Cholesterol Latest Ref Range: <200 mg/dL     112   LDL-Cholesterol Latest Units: mg/dL (calc)     41   HDL Cholesterol Latest Ref Range: >50 mg/dL     50 (L)   Chol/HDLC Ratio Latest  Ref Range: <5.0 (calc)     2.2   Non-HDL Cholesterol Latest Ref Range: <130 mg/dL (calc)     62   Ferritin Latest Ref Range: 20 - 288 ng/mL   7 (L) 11 (L) 16 (L)   Folate Latest Units: ng/mL     11.8   Globulin Latest Ref Range: 1.9 - 3.7 g/dL (calc) 2.6 2.6 2.7 2.8 2.6   Albumin/Glob Ratio Latest Ref Range: 1.0 - 2.5 (calc) 1.5 1.6 1.4 1.5 1.6   Hgb A1C Latest Ref Range: <5.7 % of total Hgb     6.0 (H)   Iron Latest Ref Range: 45 - 160 mcg/dL   30 (L) 60 115   IIBC Latest Ref Range: 250 - 450 mcg/dL (calc)   470 (H) 422 393   Iron Saturation Latest Ref Range: 11 - 50 % (calc)   6 (L) 14 29   Triglycerides Latest Ref Range: <150 mg/dL     129   Vitamin B12 Latest Ref Range: 200 - 1,100 pg/mL   191 (L) 677 611   Vitamin D, 25-OH, Total Latest Ref Range: 30 - 100 ng/mL   32 26 (L) 40   WBC Latest Ref Range: 3.8 - 10.8 Thousand/uL 5.9 6.4 5.2 6.6 5.3   RBC Latest Ref Range: 3.80 - 5.10 Million/uL 3.50 (L) 3.36 (L) 3.27 (L) 3.71 (L) 3.57 (L)   HGB Latest Ref Range: 11.7 - 15.5 g/dL 9.8 (L) 9.6 (L) 9.1 (L) 10.5 (L) 11.5 (L)   HCT Latest Ref Range: 35.0 - 45.0 % 31.1 (L) 29.0 (L) 28.1 (L) 32.4 (L) 33.3 (L)   MCV Latest Ref Range: 80.0 - 100.0 fL 88.9 86.3 85.9 87.3 93.3   MCH Latest Ref Range: 27.0 - 33.0 pg 28.2 28.4 27.8 28.3 32.2   MCHC Latest Ref Range: 32.0 - 36.0 g/dL 31.6 (L) 32.9 32.4 32.4 34.5   RDW Latest Ref Range: 11.0 - 15.0 % 14.8 13.8 13.2 14.4 11.9   PLT Latest Ref Range: 140 - 400 Thousand/uL 257 224 218 201 179   MPV Latest Ref Range: 7.5 - 12.5 fL 7.8 7.5 9.2 9.4 9.5   SEGS Latest Units: % 64.4 64.0 59.7 55 54.7   Lymps Latest Units: % 29.5 29.0 31.7 32.9 36.7   Monocytes Latest Units: % 5.4 6.3 7.4 8.3 7.3   Eosinophils Latest Units: % 0.5 0.5 1.0 3.5 0.9   Basophils Latest Units: % 0.2 0.2 0.2 0.3 0.4   BANDS Latest Units: %     CANCELED   Metamyelocytes Latest Units: %     CANCELED   Myelocytes Latest Units: %     CANCELED   Promyelocytes Latest Units: %     CANCELED   Blasts Latest Units: %      CANCELED   Reactive Lymphs Latest Ref Range: 0 - 10 %     CANCELED   NRBC Latest Ref Range: 0 /100 WBC     CANCELED   Abs  Neutrophils Latest Ref Range: 1,500 - 7,800 cells/uL 3,800 4,096 3,104 3,630 2,899   Abs Lymphs Latest Ref Range: 850 - 3,900 cells/uL 1,741 1,856 1,648 2,171 1,945   Abs Monocytes Latest Ref Range: 200 - 950 cells/uL 319 403 385 548 387   Abs Eosinophils Latest Ref Range: 15 - 500 cells/uL 30 32 52 231 48   Abs Basophils Latest Ref Range: 0 - 200 cells/uL _0 Abs Band Neutrophils Latest Ref Range: 0 - 750 cells/uL     CANCELED   Abs Metamyelocytes Latest Ref Range: 0 cells/uL     CANCELED   Abs Myelocytes Latest Ref Range: 0 cells/uL     CANCELED   Abs Promyelocytes Latest Ref Range: 0 cells/uL     CANCELED   Abs Blasts Latest Ref Range: 0 cells/uL     CANCELED   Abs NRBC Latest Ref Range: 0 cells/uL   0 0 0   Comments Unknown     CANCELED   Retic %, Auto Latest Units: %   1.2     Retic Absolute Latest Ref Range: 20,000 - 80,000 cells/uL   39,240         11-26-2016 hemoglobin A1c percent, normal comprehensive metabolic panel except glucose 122, creatinine 1.25, normal liver function profile, hemoglobin 11.5, hematocrit 35.4.  B 12  is 559.  10/11/2016 she count 6.1, hemoglobin 10.8, hematocrit 32.7, platelet count of 227000 normal differential, ferritin 10, normal comprehensive metabolic panel except glucose 230, BUN 35, creatinine 1.56, calcium 8.5, hemoglobin A1c 11.1%.  Vitamin D 22 ng/mL.  June 14, 2016 WBC count 6.6, hemoglobin 10.5, hematocrit 32.4, platelet count of 201,000, normal differential, normal comprehensive metabolic panel except glucose 226, BUN 29, creatinine 1.53, normal liver function profile, vitamin-D 26 ng/mL, ferritin 11, B12 is 677, [place] 27-29 is 19.  02/17/2016 WBC count 5.2, hemoglobin 9.1, hematocrit 28.1, platelet count of 218000, normal differential, normal comprehensive metabolic panel except glucose of 166, BUN 30, creatinine 1.48, normal liver  function profile, absolute retic count 39,240. Ferritin 7, B12 is 191. [place] 27-29 is 18. The vitamin-D is 32 ng/mL.  11/18/2015 WBC count 6.4, hemoglobin 9.6, hematocrit 29, platelet count of 224000, normal differential, normal comprehensive metabolic panel except glucose 148, creatinine 1.16, normal liver function profile, [place] 27-29 is 21.  August 03, 2015 creatinine 1.31,TSH 4.54, vitamin-D 23 ng/mL.  May 20, 2015 WBC count 5.3, hemoglobin 10.1, hematocrit 31.4, platelets of 224000 normal diff 27-29 is 16, Vit D 31 ng/mL normal comprehensive metabolic panel except glucose 156, creatinine 1.19, alk-phos 137.    IMAGING DATA:    Reviewed the imaging with patient:    06-09-2018 Mammogram showed    Result Impression     Stable post lumpectomy scarring and radiation therapy change in the right breast is benign. Note that distortion from scarring can obscure underlying lesions.    Suggest this patient return for her routine annual screening mammogram in 1 year.    ACR BI-RADS Category 2 - Benign Finding.     June 03, 2017 bone density shows osteopenia T-score of -2.4 in the left femoral neck.    06-03-2017 Mammogram showed Stable post lumpectomy scarring and radiation therapy change in the right breast is benign.    Suggest this patient return for her routine annual screening mammogram in 1 year.    ACR BI-RADS Category 2 -Benign Finding.    05/24/2016 bilateral diagnostic 2D and 3D mammogram shows stable post lumpectomy changes  in the right breast are benign.  December 27, 2015 echocardiogram shows a normal ejection fraction.  05-24-2015 bilateral diagnostic mammogram and bilateral breast ultrasound showed new post lumpectomy scar in the right breast is benign, stable punctate calcifications in the area areolar region of the right breast are benign, stable punctate calcifications in the left breast at 6 o'clock are benign with no evidence of malignancy.  11/22/2014 bilateral 2D and 3D mammogram shows new  punctate calcifications in the periareolar region of the right breast are probably benign in mammographic follow-up in 6 months is recommended, new post lumpectomy scar in the right breast is benign. New punctate calcifications in the left breast at 6 o'clock up probably benign.  11/22/2014 bone density shows osteoporosis with a T-score of-2.6 in the right femoral neck.  August 31, 2014 echocardiogram shows left ventricular diastolic dysfunction, normal left ventricular size and systolic function.    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  09/27/2014 left thyroid nodule atypical follicular lesion of undetermined significance, a right thyroid nodule fine-needle aspiration negative for malignancy.    IMPRESSION/PLAN:    Problem # 1: BREAST CANCER      Initial clinical stage IIA pT2N0 right IDC s/p bx 01/14/14 (ER/PR negative, Her-2 positive by copy number = 6.6, Ki-67=50%). MRI breast 01/29/14 showed lesion to be 2.2 x 1.6 x 2.0 cm. Staging PET/CT negative for distant mets. Neoadjuvant chemotherapy with Her-2 targeted therapy was suggested, since tumor size > 2cm, so recommended TCH + P (Taxotere, Carboplatin, Herceptin, Perjeta) q3wks x 6 cycles. Systemic therapies started 02/17/14 but pt developed severe diarrhea causing dehydration and electrolyte abnormalities requiring daily hydration and electrolyte replacements so changed regimen to weekly Taxol x 12 weeks with Herceptin/Perjeta q3wks and tolerated only slightly better. Right breast mass resolved on physical exam. Pt was becoming weaker due to ongoing diarrhea on Taxol so discontinued after 04/28/14 dose with plan for repeat breast imaging followed by surgery if response was good. Breast imaging 04/28/14 (mammo, u/s) and MRI 04/30/14 showed minimal residual disease so Taxol was discontinued after 04/28/14  dose (week #8) and Herceptin/Perjeta continued. Right lumpectomy + SLNB 05/28/14 showed 6 mm residual disease, grade 2, SBR=7/9 (lower mitotic rate than bx), 0/1 LN; repeat ER/PR negative. Since only 5 doses of Perjeta were given pre-operatively and there was residual disease in surgical specimen, she was given the final 6th dose of Perjeta 06/02/14 with usual q3wk dose of Herceptin. Single-agent Herceptin continued q3wk and completed a 1-yr course in 12-2014 and since then no therapy. The radiation therapy was completed. Last mammogram reviewed and negative and next one planned for August of 2020 and  discussed again signs and symptoms suggestive of recurrent disease. Clinically no evidence of any recurrent disease and I will continue to monitor.  Explained that due to her 2 targeted therapy I would recommend periodic echo and she is followed by Cardiology.    Problem # 2: ACUTE DVT OF UPPER EXTREMITY      LUE acute DVT dx'ed by u/s 02/10/14 due to sudden onset of left arm swelling, completed anticoagulation and no evidence of recurrent thrombosis. May consider to remove PORT was concerned about chance to develop DVT but advised that removing the PORT does not usually cause a DVT.    Problem # 3: ABNORMAL ECHO      Baseline ECHO  02/10/14 with normal EF of 65-70% but with mild diastolic dysfunction. I advised her to continue f/u with Dr. Reubin Milan for monitoring of cardiac function and recommended again a repeat ECHO and will retrieve the report from 2019.    Problem # 4: THYROID NODULE       She had a total thyroidectomy and no malignancy found. Follow-up with Endocrinology or now PCP.    PROBLEM # 5 OSTEOPOROSIS    Repeat bone density was reviewed now she has osteopenia and remains on Fosamax and also vitamin-D and calcium. Repeat DEXA due 05-2019.    PROBLEM # 6 MELANOMA    No new complaints and followed by dermatology. Discussed signs and symptoms suggestive of recurrent disease.    PLAN:    1) Completed  single-agent Herceptin q3wks to complete 67yrtotal course (06/23/14 thru 01/19/15).  Repeat mammogram 05-2019.  2) Anemia mild and now likely due to the chronic renal insufficiency but stable.  3) ECHO to be repeated as per Dr. RReubin Milanof cardiology.  Need to obtain the most recent report.  4) Thyroidectomy done due to atypical left thyroid nodule and no malignancy, follow-up with Endocrinology.  5) Renal insufficiency and diabetes may contribute to anemia and advised her to stay well hydrated and follow-up with nephrology.  Her creatinine is stable.  6) History of melanoma and she is followed by Dermatology.      Orders Placed This Encounter   Procedures   . Comprehensive Metabolic Panel - See Instructions   . CBC w/ Diff [place]   . [place] 27.29, BLOOD     Thank you very much for allowing our continued participation in the care of this patient.     More than 50% of this 30 min visit was spent in educating the patient about their condition, discussing compliance issues, counseling including answering all of the patients questions and coordination of care. This included review of relevant laboratory, radiology and other diagnostic tests, explaining medical management choices and the patient verbalized understanding. All risks, benefits, and alternatives were discussed in detail with the patient and the patient wished to proceed with the suggested treatment plan.     RTO: 4-6 months    I certify that I have reviewed the documentation contained in this clinical record and that it is accurately recorded. This document contains private and confidential health information protected by state and federal law and any release of this information requires the written prior authorization of the above mentioned patient.    Please note this report was dictated with the use of voice recognition software.  It may contain inadvertent spelling or grammatical errors which were not detected  during the editing process.       Take a virtual tour of our facility: [email or web address]   Clinical trials: [email or web address]       --------------------------------------  ABarbaraann Faster MD, MBA

## 2018-12-17 NOTE — Patient Instructions (Addendum)
Orders Placed This Encounter   Procedures   . Digital Diagnostic Mammogram Bilateral   . [place] 27.29, BLOOD   . CBC w/ Diff Lavender   . Comprehensive Metabolic Panel, Plasma   . Vitamin D, 25-Hydroxy, Blood Gel-Barrier Tube

## 2018-12-25 ENCOUNTER — Other Ambulatory Visit: Payer: Self-pay | Admitting: Nurse Practitioner

## 2018-12-25 ENCOUNTER — Encounter: Payer: Self-pay | Admitting: Nurse Practitioner

## 2018-12-25 DIAGNOSIS — M8580 Other specified disorders of bone density and structure, unspecified site: Secondary | ICD-10-CM

## 2018-12-25 MED ORDER — ALENDRONATE SODIUM 70 MG OR TABS
ORAL_TABLET | ORAL | 3 refills | Status: DC
Start: 2018-12-25 — End: 2019-04-27

## 2018-12-25 NOTE — Progress Notes (Signed)
error 

## 2018-12-29 ENCOUNTER — Other Ambulatory Visit: Payer: Self-pay | Admitting: Nurse Practitioner

## 2018-12-29 ENCOUNTER — Other Ambulatory Visit: Payer: Self-pay

## 2018-12-29 DIAGNOSIS — E538 Deficiency of other specified B group vitamins: Secondary | ICD-10-CM

## 2018-12-29 DIAGNOSIS — G629 Polyneuropathy, unspecified: Secondary | ICD-10-CM

## 2018-12-29 MED ORDER — VITAMIN B-12 1000 MCG OR TABS
1000.0000 ug | ORAL_TABLET | Freq: Every day | ORAL | 3 refills | Status: DC
Start: 2018-12-29 — End: 2019-04-23

## 2018-12-30 MED ORDER — GABAPENTIN 300 MG OR CAPS
ORAL_CAPSULE | ORAL | 2 refills | Status: DC
Start: 2018-12-30 — End: 2019-03-30

## 2019-01-02 MED ORDER — ALENDRONATE SODIUM 70 MG OR TABS
ORAL_TABLET | ORAL | 0 refills | Status: DC
Start: 2019-01-02 — End: 2019-02-11

## 2019-02-11 ENCOUNTER — Other Ambulatory Visit: Payer: Self-pay

## 2019-02-11 DIAGNOSIS — M810 Age-related osteoporosis without current pathological fracture: Secondary | ICD-10-CM

## 2019-02-11 MED ORDER — ALENDRONATE SODIUM 70 MG OR TABS
70.0000 mg | ORAL_TABLET | ORAL | 0 refills | Status: DC
Start: 2019-02-11 — End: 2019-06-10

## 2019-02-17 ENCOUNTER — Other Ambulatory Visit: Payer: Self-pay

## 2019-03-29 ENCOUNTER — Other Ambulatory Visit: Payer: Self-pay | Admitting: Nurse Practitioner

## 2019-03-29 DIAGNOSIS — G629 Polyneuropathy, unspecified: Secondary | ICD-10-CM

## 2019-03-30 MED ORDER — GABAPENTIN 300 MG OR CAPS
ORAL_CAPSULE | ORAL | 2 refills | Status: DC
Start: 2019-03-30 — End: 2019-10-12

## 2019-04-23 ENCOUNTER — Other Ambulatory Visit: Payer: Self-pay | Admitting: Hematology & Oncology

## 2019-04-23 DIAGNOSIS — E538 Deficiency of other specified B group vitamins: Secondary | ICD-10-CM

## 2019-04-23 MED ORDER — VITAMIN B-12 1000 MCG OR TABS
1000.0000 ug | ORAL_TABLET | Freq: Every day | ORAL | 0 refills | Status: AC
Start: 2019-04-23 — End: ?

## 2019-04-24 ENCOUNTER — Other Ambulatory Visit: Payer: Self-pay | Admitting: Nurse Practitioner

## 2019-04-24 DIAGNOSIS — M8580 Other specified disorders of bone density and structure, unspecified site: Secondary | ICD-10-CM

## 2019-04-27 MED ORDER — ALENDRONATE SODIUM 70 MG OR TABS
ORAL_TABLET | ORAL | 0 refills | Status: DC
Start: 2019-04-27 — End: 2019-08-24

## 2019-05-12 ENCOUNTER — Ambulatory Visit: Payer: Medicare Other | Admitting: Hematology & Oncology

## 2019-06-04 LAB — CBC WITH DIFF, BLOOD
Abs Basophils: 7 cells/uL (ref 0–200)
Abs Eosinophils: 44 cells/uL (ref 15–500)
Abs Lymphs: 2073 cells/uL (ref 850–3900)
Abs Monocytes: 533 cells/uL (ref 200–950)
Abs NRBC: 0 cells/uL
Abs Neutrophils: 4643 cells/uL (ref 1500–7800)
Basophils: 0.1 %
Eosinophils: 0.6 %
HCT: 37.1 % (ref 35.0–45.0)
HGB: 12.5 g/dL (ref 11.7–15.5)
Lymps: 28.4 %
MCH: 30.6 pg (ref 27.0–33.0)
MCHC: 33.7 g/dL (ref 32.0–36.0)
MCV: 90.9 fL (ref 80.0–100.0)
MPV: 10.1 fL (ref 7.5–12.5)
Monocytes: 7.3 %
PLT: 217 10*3/uL (ref 140–400)
RBC: 4.08 10*6/uL (ref 3.80–5.10)
RDW: 12 % (ref 11.0–15.0)
SEGS: 63.6 %
WBC: 7.3 10*3/uL (ref 3.8–10.8)

## 2019-06-04 LAB — COMPREHENSIVE METABOLIC PANEL, PLASMA-QUEST
ALT (SGPT): 9 U/L (ref 6–29)
AST (SGOT): 14 U/L (ref 10–35)
Albumin/Globulin Ratio: 1.3 (calc) (ref 0.9–2.3)
Albumin: 4.1 g/dL (ref 3.6–5.1)
Alkaline Phos: 117 U/L (ref 37–153)
BUN/Creatinine Ratio: 17 (calc) (ref 6–22)
BUN: 19 mg/dL (ref 7–25)
Bilirubin, Total: 0.9 mg/dL (ref 0.2–1.2)
Calcium: 8.7 mg/dL (ref 8.6–10.4)
Carbon Dioxide: 20 mmol/L (ref 20–32)
Chloride: 104 mmol/L (ref 98–110)
Creatinine: 1.1 mg/dL — ABNORMAL HIGH (ref 0.60–0.93)
Globulin: 3.1 g/dL (calc) (ref 2.2–4.0)
Glucose: 156 mg/dL — ABNORMAL HIGH (ref 65–99)
Potassium: 4.7 mmol/L (ref 3.4–4.8)
Protein, Total: 7.2 g/dL (ref 6.4–8.4)
Sodium: 136 mmol/L (ref 135–146)
eGFR African American: 57 mL/min/{1.73_m2} — ABNORMAL LOW (ref >=60)
eGFR non-Afr.American: 49 mL/min/{1.73_m2} — ABNORMAL LOW (ref >=60)

## 2019-06-04 LAB — VITAMIN D, 25-OH TOTAL: Vitamin D, 25-OH, Total: 48 ng/mL (ref 30–100)

## 2019-06-04 LAB — ~~LOC~~ 27.29, BLOOD: ~~LOC~~ 27.29: 20 U/mL (ref <38)

## 2019-06-08 ENCOUNTER — Ambulatory Visit: Payer: Medicare Other | Admitting: Hematology & Oncology

## 2019-06-09 ENCOUNTER — Telehealth: Payer: Self-pay | Admitting: Hematology & Oncology

## 2019-06-09 NOTE — Telephone Encounter (Signed)
Patient called for Symptom screening and to confirm appointment.  Pt denies contact with a confirmed COVID-19 person with in the last 14 days. Denies travel in the past 14 days, OR  last 30 days.Denies new cough, fever greater then or equal to 99 F, Denies new rash, Denies new respiratory or cold flu like symptoms. Patient reminded of Social distancing guidelines and required masking to enter facility, pt verbalizes understanding.

## 2019-06-10 ENCOUNTER — Encounter: Payer: Self-pay | Admitting: Hematology & Oncology

## 2019-06-10 ENCOUNTER — Ambulatory Visit (INDEPENDENT_AMBULATORY_CARE_PROVIDER_SITE_OTHER): Payer: Medicare Other | Admitting: Hematology & Oncology

## 2019-06-10 VITALS — BP 132/80 | HR 80 | Temp 96.9°F | Resp 16 | Ht 64.7 in | Wt 202.4 lb

## 2019-06-10 DIAGNOSIS — Z171 Estrogen receptor negative status [ER-]: Secondary | ICD-10-CM

## 2019-06-10 DIAGNOSIS — R928 Other abnormal and inconclusive findings on diagnostic imaging of breast: Secondary | ICD-10-CM

## 2019-06-10 DIAGNOSIS — N183 Chronic kidney disease, stage 3 unspecified (CMS-HCC): Secondary | ICD-10-CM

## 2019-06-10 DIAGNOSIS — C50411 Malignant neoplasm of upper-outer quadrant of right female breast: Secondary | ICD-10-CM

## 2019-06-10 DIAGNOSIS — Z780 Asymptomatic menopausal state: Secondary | ICD-10-CM

## 2019-06-10 MED ORDER — TRULICITY 1.5 MG/0.5ML SC SOPN
PEN_INJECTOR | SUBCUTANEOUS | Status: DC
Start: 2019-04-09 — End: 2021-12-19

## 2019-06-10 NOTE — Patient Instructions (Signed)
Orders Placed This Encounter   Procedures   . MRI BREAST BILATERAL WO/W CONTRAST   . X-RAY Dexa (Bone Density) Skeletal   . [place] 27.29, BLOOD   . CBC w/ Diff Lavender   . Comprehensive Metabolic Panel   . Vitamin D, 25-OH Total Yellow serum separator tube

## 2019-06-10 NOTE — Progress Notes (Signed)
Kayla Faster, MD, [facility] Note  [address], [place], [place] [address]  Tel.: [phone] Fax: [phone]    Date of Service:  06/10/2019    Kayla Joseph, Kayla Joseph 1946/04/08    REFERRING MD: Kayla Joseph  Radiation oncology: Kayla Joseph  PCP: Kayla Joseph  GYN: Kayla Joseph   Cardiology: Kayla Joseph    CHIEF COMPLAINT: Routine follow-up visit for right breast cancer and melanoma    HISTORY OF PRESENT ILLNESS:    73 y/o Caucasian woman with mammographically detected clinical stage IIA T2N0 right breast invasive ductal carcinoma s/p bx 01/14/14 (SBR 9/9, ER/PR negative, Ki-67=50%, Her-2/neu FISH ratio=1.14 but copy number=6.6 so positive). MRI breast 01/29/14 showed mass to be 2.2 x 1.6 x 2.0 cm. Staging PET/CT 01/27/14 was negative for distant mets; showed only known right breast tumor and incidental left thyroid nodule. Planned neoadjuvant systemic therapy: TCH + P x 6 cycles followed by surgery and radiation therapy. Cycle #1 TCH+P given 02/17/14. Pt developed severe diarrhea and dehydration causing electrolyte derangements requiring daily replacement therapies and hydration. Therefore, chemotherapy was changed to weekly Taxol for 12 weeks and Herceptin plus Perjeta every 3 weeks was continued. Taxol started 03/10/14 and completed only 8 weeks on 04/28/14 due to cumulative severe side effects and MRI breast 04/30/14 showing only 72m of residual enhancement. Herceptin/Perjeta continued q3wks for 6 total cycles. Right lumpectomy + SLNB done 05/28/14 (6 mm residual IDC, grade 2, SBR 7/9, no LI, 0/1 LN; ypT1bN0; residual tumor had lower a mitotic score compared to initial biopsy specimen likely due to chemotherapy effect; repeat ER and PR on residual tumor was again negative for both). Due to residual disease although minimal, pt received final dose (#6) of Perjeta 06/02/14. Herceptin single-agent q3wks started 06/23/14.        July 14, 2014 (Herceptin week #22)     Started single-agent Herceptin 06/23/14 and tolerated it well without side effects. Hair is starting to grow. Neuropathy in the fingers and bottoms of the feet continue but improve when she takes gabapentin 300 mg 3 times a day. She does not want to increase the dose since it is controlling her neuropathy and she does not want to increase fatigue due to medication side effect. [place] but it was improved with more solid form [place] The only difference during that trip was that her friends were cooking all of her meals for her so a change in diet seems to have improved the diarrhea. Since her return, her diarrhea has resumed unchanged. She has about 6 watery episodes of diarrhea per day and has had 3 episodes thus far this morning. She continues Percocet as needed but also has a prior prescription for more tincture of opium which she will change back to since postop pain has resolved. She has not yet done repeat stool studies since she was on her trip but will do so soon with prior order. She has not seen the gastroenterologist yet but will ask her primary care doctor for a referral to see one soon. Dysuria symptoms recurred this past weekend. Bactrim worked well in the past after the 1st dose. She saw Dr PMarshall Cork9/4/15 and will start xrt next week on 07/21/14. She receives hydration every 3 weeks with Herceptin but requests more frequent visits since her diarrhea is ongoing. Labs (CBC, CMP)  06/02/14 sig for H/H=10.6/32.9; gluc=221.   The patient completed the Herceptin every 3 weeks for total of 1 year of adjuvant therapy in 12-2014 and remains off therapy.     INTERVAL HISTORY:    The patient reports no new breast lesions but she is concerned about the most recent imaging. She remains on vitamin-D and calcium and weekly Alendronate for osteopenia and tolerating it well and no  reported dental issues.She is followed by dermatology for melanoma and reports no new skin lesions. She denies any bleeding.  She is worried about COVID-19 but denies any fevers or chills or cough or shortness of breath.     PAST MEDICAL HISTORY:     Reviewed and no changes.    Stage IIA T2N0 right breast IDC --> ypT1bN0   LUE DVT dx'ed by U/S 02/10/14 -- Lovenox started same day; resolved on 05/18/14 U/S; completed Lovenox 06/07/14   Stage I pT1aN0 melanoma of upper back s/p excision  DM II  Hyperlipidemia  Hypertension  BCC skin  Thyroid nodules bilaterally   She had a colonoscopy in 2018 and to be repeated in 2023.       PAST SURGICAL HISTORY:     Reviewed and no changes.    05/28/14 Right lumpectomy + SLNB   02/05/14 L. chest port placement ([facility])  07/04/11 Wide excision upper back melanoma + R. supraclavicular SLNB (lentigo melanoma and melanoma in situ, 0.75 mm, no residual invasive melanoma, 0/1 LN)   2013 Facelift  2012 Gastric bypass  1999 Cholecystectomy  1995 Left foot fracture repair with pins  Age 26 Tonsillectomy     PAST OB/GYN HISTORY:     Reviewed and no changes.     G0P0. Menarche at 73. OCP x 15 yrs; last age 46s. HRT late 27s for <20mhs; stopped after [facility] study results reported. LMP age 73      HEALTH MAINTENANCE:    Colo -- 3-4 yrs ago; repeat due next year   DXA -- 2015 osteopenia; prescribed Fosamax but never started   Pap -- 11/2013 (one prior abnl pap and polyp removed)      FAMILY HISTORY:    Reviewed and no changes.    Mother -- died 852of CHF  Father -- died 863of decline after stroke  Sister -- died 452of NHL; another sister alive and well     SOCIAL HISTORY:    Reviewed and no changes    Lives with partner, FQuintella Joseph   Kids -- none   Retired [employer] developer   Tobacco -- only 4 yrs in early 20s, <1ppd  Alcohol - 1 glass wine/mth at most   Drugs - none      CURRENT MEDS:    I reviewed the medication  list with the patient.    ALLERGIES:     No Known Allergies    REVIEW OF SYSTEMS (ROS):    A comprehensive 15-point ROS was performed and reviewed with the patient and is negative unless noted above.     EXAM:    Vitals 12/03/2017 06/17/2018 12/17/2018 81/74/0814  Systolic [id number]  Diastolic 75 73 79 80   Position Sitting Sitting Sitting Sitting   Site Right arm Left arm Left arm Right arm   Cuff Size Regular Regular - Regular   Pulse 72 72 98 80   Resp 16 16 18 16    Temp 97.4 98.5 98.1 96.9   Temp Source 5 1 1  5   Weight (kg) 82.918 kg 91.627 kg 95.1 kg 91.808 kg   Weight (lbs) 182 lb 12.8 oz 202 lb 209 lb 10.5 oz 202 lb 6.4 oz   Height (cm) 165.1 cm 165.1 cm 165.1 cm 164.3 cm   Height (ft in) 5' 5"  5' 5"  5' 5"  5' 4.7"   BMI (kg/m2) 30.42 kg/m2 33.61 kg/m2 34.89 kg/m2 33.99 kg/m2   BSA (m2) 1.95 m2 2.05 m2 2.09 m2 2.05 m2   Fall Risk No fall risk identified No fall risk identified No fall risk identified No fall risk identified   Some recent data might be hidden     GENERAL: The patient is well developed and well nourished, ambulatory, in no acute distress.  HEENT: normocephalic/atraumatic, anicteric sclera.  HEART: RRR.  CHEST: Clear to auscultation bilaterally.  AXILLAE: No palpable lesions bilaterally.  BREASTS: Were examined bilaterally, no new palpable breast lesions, no nipple discharge bilaterally.   ABDOMEN: soft, non-tender, non-distended, normal bowel sounds, no rebound or guarding, no appreciable masses  EXTREMITIES: No edema of the lower extremities.  SKIN:  No rash or jaundice.  NEURO: AOx3, EOMI, no focal deficits, gait steady.  PSYCHIATRIC: Good insight adequate mood and affect.    LAB DATA:    Reviewed the lab work with patient:    Results for CYNDEL, GRIFFEY (MRN [id number])      Ref. Range 11/18/2015 09:37 02/17/2016 08:50 06/14/2016 09:33 05/28/2017 09:30 06/03/2019 10:14   Sodium Latest Ref Range: 135 - 146 mmol/L 139 138 137 138 136   Potassium Latest Ref Range: 3.5 - 5.3 mmol/L 5.1 4.9 4.7 5.4  (H)    Potassium Latest Ref Range: 3.4 - 4.8 mmol/L     4.7   Chloride Latest Ref Range: 98 - 110 mmol/L 106 104 101 107 104   Carbon Dioxide Latest Ref Range: 20 - 32 mmol/L 24 25 25 26 20    BUN Latest Ref Range: 7 - 25 mg/dL 23 30 (H) 29 (H) 21 19   Creatinine Latest Ref Range: 0.60 - 0.93 mg/dL 1.16 (H) 1.48 (H) 1.53 (H) 1.24 (H) 1.10 (H)   eGFR non-Afr.American Latest Ref Range: > OR = 60 mL/min/1.28m 47 (L) 35 (L) 34 (L) 43 (L) 49 (L)   eGFR African American Latest Ref Range: > OR = 60 mL/min/1.721m55 (L) 41 (L) 39 (L) 50 (L) 57 (L)   Glucose Latest Ref Range: 65 - 99 mg/dL 148 (H) 166 (H) 226 (H) 103 (H) 156 (H)   Calcium Latest Ref Range: 8.6 - 10.4 mg/dL 8.7 8.6 8.8 9.5 8.7   Protein, Total Latest Ref Range: 6.4 - 8.4 g/dL     7.2   Total Protein Latest Ref Range: 6.1 - 8.1 g/dL 6.7 6.6 6.9 6.7    Alkaline Phos Latest Ref Range: 37 - 153 U/L 126 98 116 83 117   AST (SGOT) Latest Ref Range: 10 - 35 U/L 15 18 12 13 14    ALT (SGPT) Latest Ref Range: 6 - 29 U/L 11 15 8 9 9    Albumin Latest Ref Range: 3.6 - 5.1 g/dL 4.1 3.9 4.1 4.1 4.1   Bilirubin, Total Latest Ref Range: 0.2 - 1.2 mg/dL 0.8 0.7 0.6 1.0 0.9   BUN/Creatinine Ratio Latest Ref Range: 6 - 22 (calc) 20 20 19 17 17    [place] 27.29 Latest Ref Range: <38 U/mL 21 18 19 14 20    Cholesterol Latest Ref Range: <200 mg/dL    112  LDL-Cholesterol Latest Units: mg/dL (calc)    41    HDL Cholesterol Latest Ref Range: >50 mg/dL    50 (L)    Chol/HDLC Ratio Latest Ref Range: <5.0 (calc)    2.2    Non-HDL Cholesterol Latest Ref Range: <130 mg/dL (calc)    62    Ferritin Latest Ref Range: 20 - 288 ng/mL  7 (L) 11 (L) 16 (L)    Folate Latest Units: ng/mL    11.8    Globulin Latest Ref Range: 1.9 - 3.7 g/dL (calc) 2.6 2.7 2.8 2.6    Globulin Latest Ref Range: 2.2 - 4.0 g/dL (calc)     3.1   Albumin/Glob Ratio Latest Ref Range: 1.0 - 2.5 (calc) 1.6 1.4 1.5 1.6    Albumin/Globulin Ratio Latest Ref Range: 0.9 - 2.3 (calc)     1.3   Hgb A1C Latest Ref Range: <5.7 % of total  Hgb    6.0 (H)    Iron Latest Ref Range: 45 - 160 mcg/dL  30 (L) 60 115    IIBC Latest Ref Range: 250 - 450 mcg/dL (calc)  470 (H) 422 393    Iron Saturation Latest Ref Range: 11 - 50 % (calc)  6 (L) 14 29    Triglycerides Latest Ref Range: <150 mg/dL    129    Vitamin B12 Latest Ref Range: 200 - 1,100 pg/mL  191 (L) 677 611    Vitamin D, 25-OH, Total Latest Ref Range: 30 - 100 ng/mL  32 26 (L) 40 48   WBC Latest Ref Range: 3.8 - 10.8 Thousand/uL 6.4 5.2 6.6 5.3 7.3   RBC Latest Ref Range: 3.80 - 5.10 Million/uL 3.36 (L) 3.27 (L) 3.71 (L) 3.57 (L) 4.08   HGB Latest Ref Range: 11.7 - 15.5 g/dL 9.6 (L) 9.1 (L) 10.5 (L) 11.5 (L) 12.5   HCT Latest Ref Range: 35.0 - 45.0 % 29.0 (L) 28.1 (L) 32.4 (L) 33.3 (L) 37.1   MCV Latest Ref Range: 80.0 - 100.0 fL 86.3 85.9 87.3 93.3 90.9   MCH Latest Ref Range: 27.0 - 33.0 pg 28.4 27.8 28.3 32.2 30.6   MCHC Latest Ref Range: 32.0 - 36.0 g/dL 32.9 32.4 32.4 34.5 33.7   RDW Latest Ref Range: 11.0 - 15.0 % 13.8 13.2 14.4 11.9 12.0   PLT Latest Ref Range: 140 - 400 Thousand/uL 224 218 201 179 217   MPV Latest Ref Range: 7.5 - 12.5 fL 7.5 9.2 9.4 9.5 10.1   SEGS Latest Units: % 64.0 59.7 55 54.7 63.6   Lymps Latest Units: % 29.0 31.7 32.9 36.7 28.4   Monocytes Latest Units: % 6.3 7.4 8.3 7.3 7.3   Eosinophils Latest Units: % 0.5 1.0 3.5 0.9 0.6   Basophils Latest Units: % 0.2 0.2 0.3 0.4 0.1   BANDS Latest Units: %    CANCELED CANCELED   Metamyelocytes Latest Units: %    CANCELED CANCELED   Myelocytes Latest Units: %    CANCELED CANCELED   Promyelocytes Latest Units: %    CANCELED CANCELED   Blasts Latest Units: %    CANCELED CANCELED   Reactive Lymphs Latest Ref Range: 0 - 10 %    CANCELED CANCELED   NRBC Latest Ref Range: 0 /100 WBC    CANCELED CANCELED   Abs Neutrophils Latest Ref Range: 1,500 - 7,800 cells/uL 4,096 3,104 3,630 2,899 4,643   Abs Lymphs Latest Ref Range: 850 - 3,900 cells/uL 1,856 1,648 2,171 1,945 2,073  Abs Monocytes Latest Ref Range: 200 - 950 cells/uL 403 385 548  387 533   Abs Eosinophils Latest Ref Range: 15 - 500 cells/uL 32 52 231 48 44   Abs Basophils Latest Ref Range: 0 - 200 cells/uL 13 10 20 21 7    Abs Band Neutrophils Latest Ref Range: 0 - 750 cells/uL    CANCELED CANCELED   Abs Metamyelocytes Latest Ref Range: 0 cells/uL    CANCELED CANCELED   Abs Myelocytes Latest Ref Range: 0 cells/uL    CANCELED CANCELED   Abs Promyelocytes Latest Ref Range: 0 cells/uL    CANCELED CANCELED   Abs Blasts Latest Ref Range: 0 cells/uL    CANCELED CANCELED   Abs NRBC Latest Ref Range: 0 cells/uL  0 0 0 0   Comments Unknown    CANCELED CANCELED   Retic %, Auto Latest Units: %  1.2      Retic Absolute Latest Ref Range: 20,000 - 80,000 cells/uL  39,240        IMAGING DATA:    Reviewed the imaging with patient:    05-26-2019 Mammogram showed   Result Impression     Area of distortion consistent with post surgical scarring in the left breast is benign. The findings and recommendations were discussed with the patient by Dr. Ricki Miller at the conclusion of this examination.    Suggest this patient return for her routine annual screening mammogram in 1 year.    ACR BI-RADS Category 2 - Benign Finding.     06-09-2018 Mammogram showed    Result Impression     Stable post lumpectomy scarring and radiation therapy change in the right breast is benign. Note that distortion from scarring can obscure underlying lesions.    Suggest this patient return for her routine annual screening mammogram in 1 year.    ACR BI-RADS Category 2 - Benign Finding.     June 03, 2017 bone density shows osteopenia T-score of -2.4 in the left femoral neck.    06-03-2017 Mammogram showed Stable post lumpectomy scarring and radiation therapy change in the right breast is benign.    Suggest this patient return for her routine annual screening mammogram in 1 year.    ACR BI-RADS Category 2 -Benign Finding.    05/24/2016 bilateral diagnostic 2D and 3D mammogram shows stable post lumpectomy changes in the right breast are  benign.  December 27, 2015 echocardiogram shows a normal ejection fraction.  05-24-2015 bilateral diagnostic mammogram and bilateral breast ultrasound showed new post lumpectomy scar in the right breast is benign, stable punctate calcifications in the area areolar region of the right breast are benign, stable punctate calcifications in the left breast at 6 o'clock are benign with no evidence of malignancy.  11/22/2014 bilateral 2D and 3D mammogram shows new punctate calcifications in the periareolar region of the right breast are probably benign in mammographic follow-up in 6 months is recommended, new post lumpectomy scar in the right breast is benign. New punctate calcifications in the left breast at 6 o'clock up probably benign.  11/22/2014 bone density shows osteoporosis with a T-score of-2.6 in the right femoral neck.  August 31, 2014 echocardiogram shows left ventricular diastolic dysfunction, normal left ventricular size and systolic function.    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  09/27/2014 left thyroid nodule atypical follicular lesion of  undetermined significance, a right thyroid nodule fine-needle aspiration negative for malignancy.    IMPRESSION/PLAN:    Problem # 1: BREAST CANCER      Initial clinical stage IIA pT2N0 right IDC s/p bx 01/14/14 (ER/PR negative, Her-2 positive by copy number = 6.6, Ki-67=50%). MRI breasts 01/29/14 showed lesion to be 2.2 x 1.6 x 2.0 cm. Staging PET/CT negative for distant mets. Neoadjuvant chemotherapy with Her-2 targeted therapy was suggested, since tumor size > 2cm, so recommended TCH + P (Taxotere, Carboplatin, Herceptin, Perjeta) q3wks x 6 cycles. Systemic therapies started 02/17/14 but pt developed severe diarrhea causing dehydration and electrolyte abnormalities requiring daily hydration and electrolyte  replacements so changed regimen to weekly Taxol x 12 weeks with Herceptin/Perjeta q3wks and tolerated only slightly better. Right breast mass resolved on physical exam. Pt was becoming weaker due to ongoing diarrhea on Taxol so discontinued after 04/28/14 dose with plan for repeat breast imaging followed by surgery if response was good. Breast imaging 04/28/14 (mammo, u/s) and MRI 04/30/14 showed minimal residual disease so Taxol was discontinued after 04/28/14 dose (week #8) and Herceptin/Perjeta continued. Right lumpectomy + SLNB 05/28/14 showed 6 mm residual disease, grade 2, SBR=7/9 (lower mitotic rate than bx), 0/1 LN; repeat ER/PR negative. Since only 5 doses of Perjeta were given pre-operatively and there was residual disease in surgical specimen, she was given the final 6th dose of Perjeta 06/02/14 with usual q3wk dose of Herceptin. Single-agent Herceptin continued q3wk and completed a 1-yr course in 12-2014 and since then no therapy. The radiation therapy was completed. Clinically no new findings in the most recent was reviewed a repeat breast MRI was ordered.  She was encouraged to continue breast self-examination.    Problem # 2: ACUTE DVT OF UPPER EXTREMITY      LUE acute DVT dx'ed by u/s 02/10/14 due to sudden onset of left arm swelling, completed anticoagulation and no evidence of recurrent thrombosis. May consider to remove PORT was concerned about chance to develop DVT but advised that removing the PORT does not usually cause a DVT.    Problem # 3: ABNORMAL ECHO      Baseline ECHO 02/10/14 with normal EF of 65-70% but with mild diastolic dysfunction. I advised her to continue f/u with Dr. Reubin Milan for monitoring of cardiac function and recommended again a repeat ECHO and will retrieve the report from 2019.    Problem # 4: THYROID NODULE       She had a total thyroidectomy and no malignancy found. Follow-up with Endocrinology or now PCP.    PROBLEM # 5 OSTEOPOROSIS    Repeat bone density was reviewed now  she has osteopenia and remains on Fosamax and also vitamin-D and calcium. Repeat DEXA due 05-2019.    PROBLEM # 6 MELANOMA    No new complaints and followed by dermatology. Discussed signs and symptoms suggestive of recurrent disease.    Cancer Patients and Survivors and COVID19    An analysis of 1590 COVID-19 cases in [place] showed that both patients living with cancer and cancer survivors face unique risks from SARS-CoV-2. The authors found that 70 of the 1590 cases, ~1%, had a history of cancer compared to the 0.3% incidence of cancer in the overall population in [place], and 5 out of these 18 patients had lung cancer. The group included 12 cancer survivors in routine follow-up after primary resection, 4 patients who received chemotherapy or had surgery in the past month, and 2 patients whose treatment status was unknown. The  researchers hypothesized that patients with cancer might be more vulnerable due to the systemic immunosuppressive state caused by the cancer itself and treatments for the condition, such as chemotherapy or surgery. It was further noted that among the people with COVID-19, those with a history of cancer had a much higher risk of severe events (eg, admission to the intensive care unit, requiring invasive ventilation, or death) than those who did not have a history of cancer (7 of 18 (39%) patients vs 124 of 1572 (8%) patients; Fisher's exact P =.0003).    Source:  Nicanor Alcon, et al. Cancer patients in SARS-CoV-2 infection: a [place] analysis in [place]. Lancet Oncol. 2020 Mar;21(3):335-337.    Patients with cancer are among those at high risk of complications if infected with the new coronavirus and are encouraged to immediately reach out to [place] for any new problems suggestive of a COVID19 infection.     Patients with cancer seem to be more vulnerable to COVID-19 and have higher risks for severe outcomes, according to an additional study published online April 28 in [place].  Samuel Bouche, MD, PhD, from the [facility] of [place] in [place], and colleagues performed a multicenter study involving 105 cancer patients and 536 age-matched noncancer patients confirmed with COVID-19 from 14 hospitals in [place].    The researchers found that patients with cancer had higher observed death rates, higher rates of intensive care unit (ICU) admission, and higher rates of having at least one severe or critical symptom compared with noncancer COVID-19 patients (odds ratios, 2.34, 2.84, and 2.79, respectively); they also had higher chances of requiring invasive mechanical ventilation. These findings were similar after multivariable adjustment. The highest frequency of severe events was seen for patients with hematological cancer and lung cancer. Patients with metastatic cancer (stage IV) had higher risks for death, ICU admission, having severe conditions, and use of invasive mechanical ventilation, while no statistically significant differences were seen for patients with nonmetastatic cancer compared with those without cancer. Patients with cancer who received surgery had a higher frequency of severe events, while those receiving radiotherapy did not have significant differences in any severe events compared with those without cancer.    These are small number of cancer patients so far and we may not be able necessarily to generalize this and it may not apply to all patients with cancer. Ongoing research now worldwide will hopefully shed more light on this particular problem.    Adapted from the CDC "Symptoms and Caring for Yourself at Home"    Discussed signs and symptoms of COVID19 and advised to immediately contact us for any new problems.    Reported illnesses have ranged from mild symptoms to severe illness and death for confirmed coronavirus disease 2019 (COVID-19) cases.    These symptoms may appear 2-14 days after exposure (based on the incubation period of MERS-CoV  viruses).  . Fever   . Cough   . Shortness of breath    If you have possible or confirmed COVID-19:  . Stay home from work, school, and away from other public places.   . If you must go out, avoid using any kind of public transportation, ridesharing, or taxis.   . Monitor your symptoms carefully.   . If your symptoms get worse, call your healthcare provider immediately.   . Get rest and stay hydrated. If you have a medical appointment, call the healthcare provider ahead of time and tell them that you have or  may have COVID-19.   . For medical emergencies, call 911 and notify the dispatch personnel that you have or may have COVID-19.   . Cover your cough and sneezes.   Wendee Copp your hands often with soap and water for at least 20 seconds or clean your hands with an alcohol-based hand sanitizer that contains at least 60% alcohol.   . As much as possible, stay in a specific room and away from other people in your home.   . Also, you should use a separate bathroom, if available.   . If you need to be around other people in or outside of the home, wear a facemask.   Marland Kitchen Avoid sharing personal items with other people in your household, like dishes, towels, and bedding.   . Clean all surfaces that are touched often, like counters, tabletops, and doorknobs. Use household cleaning sprays or wipes according to the label instructions.    If you develop emergency warning signs for COVID-19 get medical attention immediately. Emergency warning signs include*:  . Trouble breathing   . Persistent pain or pressure in the chest   . New confusion or inability to arouse   . Bluish lips or face  *This list is not all inclusive. Please consult your medical provider for any other symptoms that are severe or concerning.    There is no specific antiviral treatment recommended for COVID-19. People with COVID-19 should receive supportive care to help relieve symptoms. For severe cases, treatment should include care to support vital organ  functions.    Preliminary evidence suggests that COVID-19 not only detrimentally affects respiratory organs, but also can result in "heart inflammation, acute kidney disease, neurological malfunction, blood clots, intestinal damage and liver problems."    People who think they may have been exposed to COVID-19 should contact their healthcare provider immediately.    PLAN:    1. Completed single-agent Herceptin q3wks to complete 79yrtotal course (06/23/14 thru 01/19/15).  Repeat breast MRI was ordered.  2. Anemia now resolved and her under creatinine improved.  3. ECHO to be repeated as per Dr. RReubin Milanof cardiology.    4. Thyroidectomy done due to atypical left thyroid nodule and no malignancy, follow-up with Endocrinology.  5. Renal insufficiency and diabetes may contribute to anemia but the anemia is no currently an issue.  6. History of melanoma and she is followed by Dermatology.      Orders Placed This Encounter   Procedures   . MRI BREAST BILATERAL WO/W CONTRAST   . X-RAY Dexa (Bone Density) Skeletal   . [place] 27.29, BLOOD   . CBC w/ Diff Lavender   . Comprehensive Metabolic Panel   . Vitamin D, 25-OH Total Yellow serum separator tube       Thank you very much for allowing our continued participation in the care of this patient.     More than 50% of this 30 min visit was spent in educating the patient about their condition, discussing compliance issues, counseling including answering all of the patients questions and coordination of care. This included review of relevant laboratory, radiology and other diagnostic tests, explaining medical management choices and the patient verbalized understanding. All risks, benefits, and alternatives were discussed in detail with the patient and the patient wished to proceed with the suggested treatment plan.     RTO: 4-6 months    I certify that I have reviewed the documentation contained in this clinical record and that it is  accurately recorded. This  document contains private and confidential health information protected by state and federal law and any release of this information requires the written prior authorization of the above mentioned patient.    Please note this report was dictated with the use of voice recognition software.  It may contain inadvertent spelling or grammatical errors which were not detected during the editing process.       Take a virtual tour of our facility: [email or web address]    [email or web address]    Clinical trials: [email or web address]        --------------------------------------  Kayla Faster, MD, MBA

## 2019-07-02 ENCOUNTER — Telehealth: Payer: Self-pay

## 2019-07-02 NOTE — Telephone Encounter (Signed)
Reached out to pt regarding her MRI results from 07-01-2019, inquiring if she has an appt for her US guided core needle biopsy. Per pt she will call today.     MVillaverde RN

## 2019-07-06 ENCOUNTER — Other Ambulatory Visit: Payer: Self-pay | Admitting: Hematology & Oncology

## 2019-07-06 DIAGNOSIS — E538 Deficiency of other specified B group vitamins: Secondary | ICD-10-CM

## 2019-07-13 ENCOUNTER — Other Ambulatory Visit: Payer: Self-pay | Admitting: Hematology & Oncology

## 2019-07-13 DIAGNOSIS — E538 Deficiency of other specified B group vitamins: Secondary | ICD-10-CM

## 2019-07-14 ENCOUNTER — Telehealth: Payer: Self-pay | Admitting: Hematology & Oncology

## 2019-07-14 NOTE — Telephone Encounter (Addendum)
Patient called stating that she had a bx completed however has not received a call for results. Patient states that Dr Dorris Singh informed patient that she would contact her to discuss bx results. Patient informed that results are usually discussed in person with Dr Dorris Singh however patient insisting that Dr Dorris Singh is supposed to contact her for results. Patient is scheduled for follow up on 12/14/2019. Patient can be contacted at [phone].    1640  Pt agreed to come in at 11 am on 07-16-2019 to discuss test results with Dr. Dorris Singh.    MVillaverde RN

## 2019-07-15 ENCOUNTER — Telehealth: Payer: Self-pay

## 2019-07-15 NOTE — Telephone Encounter (Signed)
Reaching out to patient to offer appointment with Dr. Dorris Singh to go over path reports/results, pt offered 11 am appointment, pt states she has a 10 am apppointment in [facility] but will shoot over as soon as she is done.

## 2019-07-15 NOTE — Telephone Encounter (Signed)
VM left by patient today @ 1112 requesting for a call back regarding breast bx results completed 1 week ago. Patient stating that she is very nervous to know the results. Patient provided contact# [phone].

## 2019-07-16 ENCOUNTER — Ambulatory Visit (INDEPENDENT_AMBULATORY_CARE_PROVIDER_SITE_OTHER): Payer: Medicare Other | Admitting: Hematology & Oncology

## 2019-07-16 ENCOUNTER — Encounter: Payer: Self-pay | Admitting: Hematology & Oncology

## 2019-07-16 VITALS — BP 143/80 | HR 88 | Temp 96.6°F | Resp 16 | Ht 64.7 in

## 2019-07-16 DIAGNOSIS — Z171 Estrogen receptor negative status [ER-]: Secondary | ICD-10-CM

## 2019-07-16 DIAGNOSIS — M8589 Other specified disorders of bone density and structure, multiple sites: Secondary | ICD-10-CM

## 2019-07-16 DIAGNOSIS — D508 Other iron deficiency anemias: Secondary | ICD-10-CM

## 2019-07-16 DIAGNOSIS — E538 Deficiency of other specified B group vitamins: Secondary | ICD-10-CM

## 2019-07-16 DIAGNOSIS — Z780 Asymptomatic menopausal state: Secondary | ICD-10-CM

## 2019-07-16 DIAGNOSIS — C50411 Malignant neoplasm of upper-outer quadrant of right female breast: Secondary | ICD-10-CM

## 2019-07-16 NOTE — Patient Instructions (Signed)
Orders Placed This Encounter   Procedures   . X-RAY Dexa (Bone Density) Skeletal   . Vitamin B12, Blood Green Plasma Separator Tube   . [place] 27.29, BLOOD   . CBC w/ Diff Lavender   . Comprehensive Metabolic Panel - See Instructions   . Iron, TIBC and Ferritin Panel - [facility]

## 2019-07-16 NOTE — Progress Notes (Signed)
Kayla Faster, MD, Kayla Joseph  [address], [place], [place] [address]  Tel.: [phone] Fax: [phone]    Date of Service:  07/16/2019    Kayla Joseph, Kayla Joseph 1945/10/26    REFERRING MD: Kayla Joseph  Radiation oncology: Kayla Joseph  PCP: Kayla Joseph  GYN: Kayla Joseph   Cardiology: Kayla Joseph    CHIEF COMPLAINT: Routine follow-up visit for right breast cancer and melanoma    HISTORY OF PRESENT ILLNESS:    73 y/o Caucasian woman with mammographically detected clinical stage IIA T2N0 right breast invasive ductal carcinoma s/Kayla bx 01/14/14 (SBR 9/9, ER/PR negative, Ki-67=50%, Her-2/neu FISH ratio=1.14 but copy number=6.6 so positive). MRI breast 01/29/14 showed mass to be 2.2 x 1.6 x 2.0 cm. Staging PET/CT 01/27/14 was negative for distant mets; showed only known right breast tumor and incidental left thyroid nodule. Planned neoadjuvant systemic therapy: TCH + Kayla x 6 cycles followed by surgery and radiation therapy. Cycle #1 TCH+Kayla given 02/17/14. Pt developed severe diarrhea and dehydration causing electrolyte derangements requiring daily replacement therapies and hydration. Therefore, chemotherapy was changed to weekly Taxol for 12 weeks and Herceptin plus Perjeta every 3 weeks was continued. Taxol started 03/10/14 and completed only 8 weeks on 04/28/14 due to cumulative severe side effects and MRI breast 04/30/14 showing only 41m of residual enhancement. Herceptin/Perjeta continued q3wks for 6 total cycles. Right lumpectomy + SLNB done 05/28/14 (6 mm residual IDC, grade 2, SBR 7/9, no LI, 0/1 LN; ypT1bN0; residual tumor had lower a mitotic score compared to initial biopsy specimen likely due to chemotherapy effect; repeat ER and PR on residual tumor was again negative for both). Due to residual disease although minimal, pt received final dose (#6) of Perjeta 06/02/14. Herceptin single-agent q3wks started 06/23/14.         July 14, 2014 (Herceptin week #22)     Started single-agent Herceptin 06/23/14 and tolerated it well without side effects. Hair is starting to grow. Neuropathy in the fingers and bottoms of the feet continue but improve when she takes gabapentin 300 mg 3 times a day. She does not want to increase the dose since it is controlling her neuropathy and she does not want to increase fatigue due to medication side effect. [place] but it was improved with [place] solid form [place] The only difference during that trip was that her friends were cooking all of her meals for her so a change in diet seems to have improved the diarrhea. Since her return, her diarrhea has resumed unchanged. She has about 6 watery episodes of diarrhea per day and has had 3 episodes thus far this morning. She continues Percocet as needed but also has a prior prescription for more tincture of opium which she will change back to since postop pain has resolved. She has not yet done repeat stool studies since she was on her trip but will do so soon with prior order. She has not seen the gastroenterologist yet but will ask her primary care doctor for a referral to see one soon. Dysuria symptoms recurred this past weekend. Bactrim worked well in the past after the 1st dose. She saw Kayla PMarshall Cork9/4/15 and will start xrt next week on 07/21/14. She receives hydration every 3 weeks with Herceptin but requests more frequent visits since her diarrhea is ongoing. Labs (CBC,  CMP) 06/02/14 sig for H/H=10.6/32.9; gluc=221.   The patient completed the Herceptin every 3 weeks for total of 1 year of adjuvant therapy in 12-2014 and remains off therapy.     INTERVAL HISTORY:    The patient went for a repeat breast MRI and had a BI-RADS category 4 finding in the right breast and underwent a right breast biopsy and is here to review the results.  She is planning to go to  [place] for a week and has a COVID-19 test pending.  She is planning to fly there.  She remains on vitamin-D and calcium and weekly Alendronate for osteopenia and tolerating it well and no reported dental issues. She reports no recent falls.  She is followed by dermatology for melanoma and reports no new skin lesions. She denies any bleeding.  She is worried about COVID-19 but denies any fevers or chills or cough or shortness of breath.     PAST MEDICAL HISTORY:     Reviewed and no changes.    Stage IIA T2N0 right breast IDC --> ypT1bN0   LUE DVT dx'ed by U/S 02/10/14 -- Lovenox started same day; resolved on 05/18/14 U/S; completed Lovenox 06/07/14   Stage I pT1aN0 melanoma of upper back s/Kayla excision  DM II  Hyperlipidemia  Hypertension  BCC skin  Thyroid nodules bilaterally   She had a colonoscopy in 2018 and to be repeated in 2023.       PAST SURGICAL HISTORY:     Reviewed and no changes.    05/28/14 Right lumpectomy + SLNB   02/05/14 L. chest port placement ([facility])  07/04/11 Wide excision upper back melanoma + R. supraclavicular SLNB (lentigo melanoma and melanoma in situ, 0.75 mm, no residual invasive melanoma, 0/1 LN)   2013 Facelift  2012 Gastric bypass  1999 Cholecystectomy  1995 Left foot fracture repair with pins  Age 73 Tonsillectomy     PAST OB/GYN HISTORY:     Reviewed and no changes.     G0P0. Menarche at 80. OCP x 15 yrs; last age 65s. HRT late 65s for <80mhs; stopped after [facility] study results reported. LMP age 73      HEALTH MAINTENANCE:    Colo -- 3-4 yrs ago; repeat due next year   DXA -- 2015 osteopenia; prescribed Fosamax but never started   Pap -- 11/2013 (one prior abnl pap and polyp removed)      FAMILY HISTORY:    Reviewed and no changes.    Mother -- died 819of CHF  Father -- died 835of decline after stroke  Sister -- died 415of NHL; another sister alive and well     SOCIAL HISTORY:    Reviewed and no changes    Lives  with partner, Kayla Joseph   Kids -- none   Retired [employer] developer   Tobacco -- only 4 yrs in early 20s, <1ppd  Alcohol - 1 glass wine/mth at most   Drugs - none      CURRENT MEDS:    I reviewed the medication list with the patient.    ALLERGIES:     No Known Allergies    REVIEW OF SYSTEMS (ROS):    A comprehensive 15-point ROS was performed and reviewed with the patient and is negative unless noted above.     EXAM:    Vitals 12/03/2017 06/17/2018 12/17/2018 06/10/2019 97/84/6962  Systolic [id number]  Diastolic 75 73 79 80 80  Position Sitting Sitting Sitting Sitting Sitting   Site Right arm Left arm Left arm Right arm Left arm   Cuff Size Regular Regular - Regular Regular   Pulse 72 72 98 80 88   Resp 16 16 18 16 16    Temp 97.4 98.5 98.1 96.9 -   Temp Source 5 1 1 5  -   Weight (kg) 82.918 kg 91.627 kg 95.1 kg 91.808 kg -   Weight (lbs) 182 lb 12.8 oz 202 lb 209 lb 10.5 oz 202 lb 6.4 oz -   Height (cm) 165.1 cm 165.1 cm 165.1 cm 164.3 cm 164.3 cm   Height (ft in) 5' 5"  5' 5"  5' 5"  5' 4.7" 5' 4.7"   BMI (kg/m2) 30.42 kg/m2 33.61 kg/m2 34.89 kg/m2 33.99 kg/m2 -   BSA (m2) 1.95 m2 2.05 m2 2.09 m2 2.05 m2 -   Fall Risk No fall risk identified No fall risk identified No fall risk identified No fall risk identified No fall risk identified   Some recent data might be hidden     GENERAL: The patient is well developed and well nourished, ambulatory, in no acute distress.  HEENT: normocephalic/atraumatic, anicteric sclera.  HEART: RRR.  CHEST: Clear to auscultation bilaterally.  AXILLAE: No palpable lesions bilaterally.  BREASTS: Were examined bilaterally, no new palpable breast lesions, no nipple discharge bilaterally.   ABDOMEN: soft, non-tender, non-distended, normal bowel sounds, no rebound or guarding, no appreciable masses  EXTREMITIES: No edema of the lower extremities.  SKIN:  No rash or jaundice.  NEURO: AOx3, EOMI, no focal deficits, gait steady.  PSYCHIATRIC: Good insight adequate mood and  affect.    LAB DATA:    Reviewed the lab work with patient:    Results for JAMEE, KEACH (MRN [id number])      Ref. Range 11/18/2015 09:37 02/17/2016 08:50 06/14/2016 09:33 05/28/2017 09:30 06/03/2019 10:14   Sodium Latest Ref Range: 135 - 146 mmol/L 139 138 137 138 136   Potassium Latest Ref Range: 3.5 - 5.3 mmol/L 5.1 4.9 4.7 5.4 (H)    Potassium Latest Ref Range: 3.4 - 4.8 mmol/L     4.7   Chloride Latest Ref Range: 98 - 110 mmol/L 106 104 101 107 104   Carbon Dioxide Latest Ref Range: 20 - 32 mmol/L 24 25 25 26 20    BUN Latest Ref Range: 7 - 25 mg/dL 23 30 (H) 29 (H) 21 19   Creatinine Latest Ref Range: 0.60 - 0.93 mg/dL 1.16 (H) 1.48 (H) 1.53 (H) 1.24 (H) 1.10 (H)   eGFR non-Afr.American Latest Ref Range: > OR = 60 mL/min/1.78m 47 (L) 35 (L) 34 (L) 43 (L) 49 (L)   eGFR African American Latest Ref Range: > OR = 60 mL/min/1.775m55 (L) 41 (L) 39 (L) 50 (L) 57 (L)   Glucose Latest Ref Range: 65 - 99 mg/dL 148 (H) 166 (H) 226 (H) 103 (H) 156 (H)   Calcium Latest Ref Range: 8.6 - 10.4 mg/dL 8.7 8.6 8.8 9.5 8.7   Protein, Total Latest Ref Range: 6.4 - 8.4 g/dL     7.2   Total Protein Latest Ref Range: 6.1 - 8.1 g/dL 6.7 6.6 6.9 6.7    Alkaline Phos Latest Ref Range: 37 - 153 U/L 126 98 116 83 117   AST (SGOT) Latest Ref Range: 10 - 35 U/L 15 18 12 13 14    ALT (SGPT) Latest Ref Range: 6 - 29 U/L 11 15 8 9 9    Albumin Latest  Ref Range: 3.6 - 5.1 g/dL 4.1 3.9 4.1 4.1 4.1   Bilirubin, Total Latest Ref Range: 0.2 - 1.2 mg/dL 0.8 0.7 0.6 1.0 0.9   BUN/Creatinine Ratio Latest Ref Range: 6 - 22 (calc) 20 20 19 17 17    [place] 27.29 Latest Ref Range: <38 U/mL 21 18 19 14 20    Cholesterol Latest Ref Range: <200 mg/dL    112    LDL-Cholesterol Latest Units: mg/dL (calc)    41    HDL Cholesterol Latest Ref Range: >50 mg/dL    50 (L)    Chol/HDLC Ratio Latest Ref Range: <5.0 (calc)    2.2    Non-HDL Cholesterol Latest Ref Range: <130 mg/dL (calc)    62    Ferritin Latest Ref Range: 20 - 288 ng/mL  7 (L) 11 (L) 16 (L)    Folate Latest Units:  ng/mL    11.8    Globulin Latest Ref Range: 1.9 - 3.7 g/dL (calc) 2.6 2.7 2.8 2.6    Globulin Latest Ref Range: 2.2 - 4.0 g/dL (calc)     3.1   Albumin/Glob Ratio Latest Ref Range: 1.0 - 2.5 (calc) 1.6 1.4 1.5 1.6    Albumin/Globulin Ratio Latest Ref Range: 0.9 - 2.3 (calc)     1.3   Hgb A1C Latest Ref Range: <5.7 % of total Hgb    6.0 (H)    Iron Latest Ref Range: 45 - 160 mcg/dL  30 (L) 60 115    IIBC Latest Ref Range: 250 - 450 mcg/dL (calc)  470 (H) 422 393    Iron Saturation Latest Ref Range: 11 - 50 % (calc)  6 (L) 14 29    Triglycerides Latest Ref Range: <150 mg/dL    129    Vitamin B12 Latest Ref Range: 200 - 1,100 pg/mL  191 (L) 677 611    Vitamin D, 25-OH, Total Latest Ref Range: 30 - 100 ng/mL  32 26 (L) 40 48   WBC Latest Ref Range: 3.8 - 10.8 Thousand/uL 6.4 5.2 6.6 5.3 7.3   RBC Latest Ref Range: 3.80 - 5.10 Million/uL 3.36 (L) 3.27 (L) 3.71 (L) 3.57 (L) 4.08   HGB Latest Ref Range: 11.7 - 15.5 g/dL 9.6 (L) 9.1 (L) 10.5 (L) 11.5 (L) 12.5   HCT Latest Ref Range: 35.0 - 45.0 % 29.0 (L) 28.1 (L) 32.4 (L) 33.3 (L) 37.1   MCV Latest Ref Range: 80.0 - 100.0 fL 86.3 85.9 87.3 93.3 90.9   MCH Latest Ref Range: 27.0 - 33.0 pg 28.4 27.8 28.3 32.2 30.6   MCHC Latest Ref Range: 32.0 - 36.0 g/dL 32.9 32.4 32.4 34.5 33.7   RDW Latest Ref Range: 11.0 - 15.0 % 13.8 13.2 14.4 11.9 12.0   PLT Latest Ref Range: 140 - 400 Thousand/uL 224 218 201 179 217   MPV Latest Ref Range: 7.5 - 12.5 fL 7.5 9.2 9.4 9.5 10.1   SEGS Latest Units: % 64.0 59.7 55 54.7 63.6   Lymps Latest Units: % 29.0 31.7 32.9 36.7 28.4   Monocytes Latest Units: % 6.3 7.4 8.3 7.3 7.3   Eosinophils Latest Units: % 0.5 1.0 3.5 0.9 0.6   Basophils Latest Units: % 0.2 0.2 0.3 0.4 0.1   BANDS Latest Units: %    CANCELED CANCELED   Metamyelocytes Latest Units: %    CANCELED CANCELED   Myelocytes Latest Units: %    CANCELED CANCELED   Promyelocytes Latest Units: %    CANCELED CANCELED  Blasts Latest Units: %    CANCELED CANCELED   Reactive Lymphs Latest Ref  Range: 0 - 10 %    CANCELED CANCELED   NRBC Latest Ref Range: 0 /100 WBC    CANCELED CANCELED   Abs Neutrophils Latest Ref Range: 1,500 - 7,800 cells/uL 4,096 3,104 3,630 2,899 4,643   Abs Lymphs Latest Ref Range: 850 - 3,900 cells/uL 1,856 1,648 2,171 1,945 2,073   Abs Monocytes Latest Ref Range: 200 - 950 cells/uL 403 385 548 387 533   Abs Eosinophils Latest Ref Range: 15 - 500 cells/uL 32 52 231 48 44   Abs Basophils Latest Ref Range: 0 - 200 cells/uL 13 10 20 21 7    Abs Band Neutrophils Latest Ref Range: 0 - 750 cells/uL    CANCELED CANCELED   Abs Metamyelocytes Latest Ref Range: 0 cells/uL    CANCELED CANCELED   Abs Myelocytes Latest Ref Range: 0 cells/uL    CANCELED CANCELED   Abs Promyelocytes Latest Ref Range: 0 cells/uL    CANCELED CANCELED   Abs Blasts Latest Ref Range: 0 cells/uL    CANCELED CANCELED   Abs NRBC Latest Ref Range: 0 cells/uL  0 0 0 0   Comments Unknown    CANCELED CANCELED   Retic %, Auto Latest Units: %  1.2      Retic Absolute Latest Ref Range: 20,000 - 80,000 cells/uL  39,240        IMAGING DATA:    Reviewed the imaging with patient:    07-01-2019 MRI of breasts showed   Result Impression   IMPRESSION:    1. Right breast: BI-RADS Category 4 - Suspicious. Recommend US-guided core needle biopsy lumpectomy scar at 11:00 location given nodular plateau enhancement seen at the superior aspect of the scar. Findings and recommendations discussed with the patient on 07/01/19 at 12:30 pm.    2. Left breast: BI-RADS Category 1 - Negative.    OVERALL ASSESSMENT: BI-RADS Category 4.     05-26-2019 Mammogram showed   Result Impression     Area of distortion consistent with post surgical scarring in the left breast is benign. The findings and recommendations were discussed with the patient by Kayla. Ricki Miller at the conclusion of this examination.    Suggest this patient return for her routine annual screening mammogram in 1 year.    ACR BI-RADS Category 2 - Benign Finding.     06-09-2018 Mammogram  showed    Result Impression     Stable post lumpectomy scarring and radiation therapy change in the right breast is benign. Joseph that distortion from scarring can obscure underlying lesions.    Suggest this patient return for her routine annual screening mammogram in 1 year.    ACR BI-RADS Category 2 - Benign Finding.     June 03, 2017 bone density shows osteopenia T-score of -2.4 in the left femoral neck.    06-03-2017 Mammogram showed Stable post lumpectomy scarring and radiation therapy change in the right breast is benign.    Suggest this patient return for her routine annual screening mammogram in 1 year.    ACR BI-RADS Category 2 -Benign Finding.    05/24/2016 bilateral diagnostic 2D and 3D mammogram shows stable post lumpectomy changes in the right breast are benign.  December 27, 2015 echocardiogram shows a normal ejection fraction.  05-24-2015 bilateral diagnostic mammogram and bilateral breast ultrasound showed new post lumpectomy scar in the right breast is benign, stable punctate calcifications in the area areolar region  of the right breast are benign, stable punctate calcifications in the left breast at 6 o'clock are benign with no evidence of malignancy.  11/22/2014 bilateral 2D and 3D mammogram shows new punctate calcifications in the periareolar region of the right breast are probably benign in mammographic follow-up in 6 months is recommended, new post lumpectomy scar in the right breast is benign. New punctate calcifications in the left breast at 6 o'clock up probably benign.  11/22/2014 bone density shows osteoporosis with a T-score of-2.6 in the right femoral neck.  August 31, 2014 echocardiogram shows left ventricular diastolic dysfunction, normal left ventricular size and systolic function.    PATHOLOGY:    I reviewed the report with her:    07/07/2019 RIGHT BREAST, 11:00 AT 7 CM, CORE BIOPSY:  Fat necrosis and scar, consistent with prior procedure site changes.  No carcinoma  identified.    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  09/27/2014 left thyroid nodule atypical follicular lesion of undetermined significance, a right thyroid nodule fine-needle aspiration negative for malignancy.    IMPRESSION/PLAN:    Problem # 1: BREAST CANCER      Initial clinical stage IIA pT2N0 right IDC s/Kayla bx 01/14/14 (ER/PR negative, Her-2 positive by copy number = 6.6, Ki-67=50%). MRI breasts 01/29/14 showed lesion to be 2.2 x 1.6 x 2.0 cm. Staging PET/CT negative for distant mets. Neoadjuvant chemotherapy with Her-2 targeted therapy was suggested, since tumor size > 2cm, so recommended TCH + Kayla (Taxotere, Carboplatin, Herceptin, Perjeta) q3wks x 6 cycles. Systemic therapies started 02/17/14 but pt developed severe diarrhea causing dehydration and electrolyte abnormalities requiring daily hydration and electrolyte replacements so changed regimen to weekly Taxol x 12 weeks with Herceptin/Perjeta q3wks and tolerated only slightly better. Right breast mass resolved on physical exam. Pt was becoming weaker due to ongoing diarrhea on Taxol so discontinued after 04/28/14 dose with plan for repeat breast imaging followed by surgery if response was good. Breast imaging 04/28/14 (mammo, u/s) and MRI 04/30/14 showed minimal residual disease so Taxol was discontinued after 04/28/14 dose (week #8) and Herceptin/Perjeta continued. Right lumpectomy + SLNB 05/28/14 showed 6 mm residual disease, grade 2, SBR=7/9 (lower mitotic rate than bx), 0/1 LN; repeat ER/PR negative. Since only 5 doses of Perjeta were given pre-operatively and there was residual disease in surgical specimen, she was given the final 6th dose of Perjeta 06/02/14 with usual q3wk dose of Herceptin. Single-agent Herceptin continued q3wk and completed a 1-yr course in 12-2014 and since then no therapy. The radiation  therapy was completed.Clinically no new findings in the most recent was reviewed a repeat breast MRI was ordered.  The repeat breast MRI showed a BI-RADS category 4 in the right breast for which she under went a biopsy and it confirmed only fat necrosis.  I explained how this differs from a malignancy.  She is wondering if it is due to trauma due to her puppy since she has never had fat grafting.  No further intervention is necessary will continue to monitor.  She was encouraged to continue breast self-examination.    Problem # 2: ACUTE DVT OF UPPER EXTREMITY      LUE acute DVT dx'ed by u/s 02/10/14 due to sudden onset of left arm swelling, completed anticoagulation and no evidence of recurrent thrombosis. May consider to remove PORT was concerned about chance to develop DVT but advised  that removing the PORT does not usually cause a DVT.    Problem # 3: ABNORMAL ECHO      Baseline ECHO 02/10/14 with normal EF of 65-70% but with mild diastolic dysfunction. I advised her to continue f/u with Kayla. Reubin Milan for monitoring of cardiac function and recommended again a repeat ECHO and will retrieve the report from 2019.    Problem # 4: THYROID NODULE       She had a total thyroidectomy and no malignancy found. Follow-up with Endocrinology or now PCP.    PROBLEM # 5 OSTEOPOROSIS    Repeat bone density was reviewed now she has osteopenia and remains on Fosamax and also vitamin-D and calcium. Repeat DEXA due now and ordered a repeat one.     PROBLEM # 6 MELANOMA    No new complaints and followed by dermatology. Discussed signs and symptoms suggestive of recurrent disease.    Cancer Patients and Survivors and COVID19    Discussed again signs and symptoms of COVID19 and advised to immediately contact us for any new problems.  A COVID-19 test is from yesterday.    An analysis of 1590 COVID-19 cases in [place] showed that both patients living with cancer and cancer survivors face unique risks from SARS-CoV-2. The authors found  that 52 of the 1590 cases, ~1%, had a history of cancer compared to the 0.3% incidence of cancer in the overall population in [place], and 5 out of these 18 patients had lung cancer. The group included 12 cancer survivors in routine follow-up after primary resection, 4 patients who received chemotherapy or had surgery in the past month, and 2 patients whose treatment status was unknown. The researchers hypothesized that patients with cancer might be more vulnerable due to the systemic immunosuppressive state caused by the cancer itself and treatments for the condition, such as chemotherapy or surgery. It was further noted that among the people with COVID-19, those with a history of cancer had a much higher risk of severe events (eg, admission to the intensive care unit, requiring invasive ventilation, or death) than those who did not have a history of cancer (7 of 18 (39%) patients vs 124 of 1572 (8%) patients; Fisher's exact Kayla =.0003).    Source:  Nicanor Alcon, et al. Cancer patients in SARS-CoV-2 infection: a [place] analysis in [place]. Lancet Oncol. 2020 Mar;21(3):335-337.    Patients with cancer are among those at high risk of complications if infected with the new coronavirus and are encouraged to immediately reach out to [place] for any new problems suggestive of a COVID19 infection.     Patients with cancer seem to be more vulnerable to COVID-19 and have higher risks for severe outcomes, according to an additional study published online April 28 in [place].  Samuel Bouche, MD, PhD, from the [facility] [place] in [place], and colleagues performed a multicenter study involving 105 cancer patients and 536 age-matched noncancer patients confirmed with COVID-19 from 14 hospitals in [place].    The researchers found that patients with cancer had higher observed death rates, higher rates of intensive care unit (ICU) admission, and higher rates of having at least one severe or critical  symptom compared with noncancer COVID-19 patients (odds ratios, 2.34, 2.84, and 2.79, respectively); they also had higher chances of requiring invasive mechanical ventilation. These findings were similar after multivariable adjustment. The highest frequency of severe events was seen for patients with hematological cancer and lung cancer. Patients with metastatic  cancer (stage IV) had higher risks for death, ICU admission, having severe conditions, and use of invasive mechanical ventilation, while no statistically significant differences were seen for patients with nonmetastatic cancer compared with those without cancer. Patients with cancer who received surgery had a higher frequency of severe events, while those receiving radiotherapy did not have significant differences in any severe events compared with those without cancer.    These are small number of cancer patients so far and we may not be able necessarily to generalize this and it may not apply to all patients with cancer. Ongoing research now worldwide will hopefully shed more light on this particular problem.    Adapted from the CDC "Symptoms and Caring for Yourself at Home"    Reported illnesses have ranged from mild symptoms to severe illness and death for confirmed coronavirus disease 2019 (COVID-19) cases.    These symptoms may appear 2-14 days after exposure (based on the incubation period of MERS-CoV viruses).  . Fever   . Cough   . Shortness of breath    If you have possible or confirmed COVID-19:  . Stay home from work, school, and away from other public places.   . If you must go out, avoid using any kind of public transportation, ridesharing, or taxis.   . Monitor your symptoms carefully.   . If your symptoms get worse, call your healthcare provider immediately.   . Get rest and stay hydrated. If you have a medical appointment, call the healthcare provider ahead of time and tell them that you have or may have COVID-19.   . For medical  emergencies, call 911 and notify the dispatch personnel that you have or may have COVID-19.   . Cover your cough and sneezes.   Wendee Copp your hands often with soap and water for at least 20 seconds or clean your hands with an alcohol-based hand sanitizer that contains at least 60% alcohol.   . As much as possible, stay in a specific room and away from other people in your home.   . Also, you should use a separate bathroom, if available.   . If you need to be around other people in or outside of the home, wear a facemask.   Marland Kitchen Avoid sharing personal items with other people in your household, like dishes, towels, and bedding.   . Clean all surfaces that are touched often, like counters, tabletops, and doorknobs. Use household cleaning sprays or wipes according to the label instructions.    If you develop emergency warning signs for COVID-19 get medical attention immediately. Emergency warning signs include*:  . Trouble breathing   . Persistent pain or pressure in the chest   . New confusion or inability to arouse   . Bluish lips or face  *This list is not all inclusive. Please consult your medical provider for any other symptoms that are severe or concerning.    There is no specific antiviral treatment recommended for COVID-19. People with COVID-19 should receive supportive care to help relieve symptoms. For severe cases, treatment should include care to support vital organ functions.    Preliminary evidence suggests that COVID-19 not only detrimentally affects respiratory organs, but also can result in "heart inflammation, acute kidney disease, neurological malfunction, blood clots, intestinal damage and liver problems."    People who think they may have been exposed to COVID-19 should contact their healthcare provider immediately.    PLAN:    1. Completed single-agent Herceptin q3wks to complete 50yr  total course (06/23/14 thru 01/19/15).  Repeat breast MRI was ordered and reviewed and the biopsy from the right breast  confirming no malignancy.  2. Anemia now resolved and her under creatinine improved.  3. ECHO to be repeated as per Kayla. Reubin Milan of cardiology.    4. Thyroidectomy done due to atypical left thyroid nodule and no malignancy, follow-up with Endocrinology.  5. Renal insufficiency and diabetes may contribute to anemia but the anemia is no currently an issue.  6. History of melanoma and she is followed by Dermatology.  7. Due for repeat bone density which was ordered.    Orders Placed This Encounter   Procedures   . X-RAY Dexa (Bone Density) Skeletal   . Vitamin B12, Blood Green Plasma Separator Tube   . [place] 27.29, BLOOD   . CBC w/ Diff [place]   . Comprehensive Metabolic Panel - See Instructions   . Iron, TIBC and Ferritin Panel - [facility]       Thank you very much for allowing our continued participation in the care of this patient.     More than 50% of this 40 min visit was spent in educating the patient about their condition, discussing compliance issues, counseling including answering all of the patients questions and coordination of care. This included review of relevant laboratory, radiology and other diagnostic tests, explaining medical management choices and the patient verbalized understanding. All risks, benefits, and alternatives were discussed in detail with the patient and the patient wished to proceed with the suggested treatment plan.     RTO: 4-6 months    I certify that I have reviewed the documentation contained in this clinical record and that it is accurately recorded. This document contains private and confidential health information protected by state and federal law and any release of this information requires the written prior authorization of the above mentioned patient.    Please Joseph this report was dictated with the use of voice recognition software.  It may contain inadvertent spelling or grammatical errors which were not detected during the  editing process.       Take a virtual tour of our facility: [email or web address]    Clinical trials: [email or web address]        --------------------------------------  Kayla Faster, MD, MBA

## 2019-08-22 ENCOUNTER — Other Ambulatory Visit: Payer: Self-pay | Admitting: Nurse Practitioner

## 2019-08-22 DIAGNOSIS — M8580 Other specified disorders of bone density and structure, unspecified site: Secondary | ICD-10-CM

## 2019-08-24 MED ORDER — ALENDRONATE SODIUM 70 MG OR TABS
ORAL_TABLET | ORAL | 2 refills | Status: AC
Start: 2019-08-24 — End: ?

## 2019-09-14 ENCOUNTER — Other Ambulatory Visit: Payer: Self-pay

## 2019-09-14 DIAGNOSIS — G629 Polyneuropathy, unspecified: Secondary | ICD-10-CM

## 2019-09-14 DIAGNOSIS — M8580 Other specified disorders of bone density and structure, unspecified site: Secondary | ICD-10-CM

## 2019-10-12 ENCOUNTER — Other Ambulatory Visit: Payer: Self-pay | Admitting: Nurse Practitioner

## 2019-10-12 DIAGNOSIS — G629 Polyneuropathy, unspecified: Secondary | ICD-10-CM

## 2019-10-12 MED ORDER — GABAPENTIN 300 MG OR CAPS
ORAL_CAPSULE | ORAL | 0 refills | Status: DC
Start: 2019-10-12 — End: 2019-12-08

## 2019-11-23 ENCOUNTER — Other Ambulatory Visit: Payer: Self-pay | Admitting: Nurse Practitioner

## 2019-11-23 DIAGNOSIS — M8580 Other specified disorders of bone density and structure, unspecified site: Secondary | ICD-10-CM

## 2019-11-27 ENCOUNTER — Other Ambulatory Visit: Payer: Self-pay | Admitting: Nurse Practitioner

## 2019-11-27 DIAGNOSIS — M8580 Other specified disorders of bone density and structure, unspecified site: Secondary | ICD-10-CM

## 2019-11-30 ENCOUNTER — Other Ambulatory Visit: Payer: Self-pay

## 2019-11-30 DIAGNOSIS — M8580 Other specified disorders of bone density and structure, unspecified site: Secondary | ICD-10-CM

## 2019-11-30 MED ORDER — ALENDRONATE SODIUM 70 MG OR TABS
70.0000 mg | ORAL_TABLET | ORAL | 2 refills | Status: DC
Start: 2019-11-30 — End: 2020-02-22

## 2019-12-06 ENCOUNTER — Other Ambulatory Visit: Payer: Self-pay | Admitting: Nurse Practitioner

## 2019-12-06 DIAGNOSIS — G629 Polyneuropathy, unspecified: Secondary | ICD-10-CM

## 2019-12-08 ENCOUNTER — Telehealth: Payer: Self-pay

## 2019-12-08 MED ORDER — GABAPENTIN 300 MG OR CAPS
ORAL_CAPSULE | ORAL | 0 refills | Status: DC
Start: 2019-12-08 — End: 2020-01-06

## 2019-12-08 NOTE — Telephone Encounter (Signed)
Spoke to patient and will have her second dose of covid this weekend. Patient was concerned of possibly getting side effects from the covid vaccine, appointment was moved to 12-17-2019 at 10 am.        [name]    12/08/2019

## 2019-12-12 LAB — IRON, TIBC AND FERRITIN PANEL - QUEST
Ferritin: 25 ng/mL (ref 16–288)
IIBC: 349 mcg/dL (calc) (ref 250–450)
Iron Saturation: 34 % (calc) (ref 16–45)
Iron: 118 ug/dL (ref 45–160)

## 2019-12-14 ENCOUNTER — Ambulatory Visit: Payer: Medicare Other | Admitting: Hematology & Oncology

## 2019-12-17 ENCOUNTER — Encounter: Payer: Self-pay | Admitting: Hematology & Oncology

## 2019-12-17 ENCOUNTER — Ambulatory Visit (INDEPENDENT_AMBULATORY_CARE_PROVIDER_SITE_OTHER): Payer: Medicare Other | Admitting: Hematology & Oncology

## 2019-12-17 VITALS — BP 132/73 | HR 93 | Temp 96.8°F | Resp 18 | Ht 65.0 in | Wt 189.0 lb

## 2019-12-17 DIAGNOSIS — C50411 Malignant neoplasm of upper-outer quadrant of right female breast: Secondary | ICD-10-CM

## 2019-12-17 DIAGNOSIS — M810 Age-related osteoporosis without current pathological fracture: Secondary | ICD-10-CM

## 2019-12-17 DIAGNOSIS — Z171 Estrogen receptor negative status [ER-]: Secondary | ICD-10-CM

## 2019-12-17 DIAGNOSIS — Z780 Asymptomatic menopausal state: Secondary | ICD-10-CM

## 2019-12-17 MED ORDER — LISINOPRIL 5 MG OR TABS
5.0000 mg | ORAL_TABLET | Freq: Every day | ORAL | Status: AC
Start: 2019-11-23 — End: ?

## 2019-12-17 MED ORDER — METFORMIN HCL 1000 MG OR TABS
ORAL_TABLET | ORAL | Status: AC
Start: 2019-10-29 — End: ?

## 2019-12-17 NOTE — Progress Notes (Signed)
Penn Grissett A. Dorris Singh, MD, MBA  Clinical Professor  Division of Hematology/Oncology, Department of Medicine  Medical Director, [address], [place] [address]  Tel: [phone]  Fax: [phone]                                        HEMATOLOGY ONCOLOGY FOLLOW UP NOTE    Date of Service:  12/17/2019    Kayla Joseph, DOB [birth year]    REFERRING MD: Bonney Leitz  Radiation oncology: Marshall Cork  PCP: [name]  GYN: [name]   Cardiology: Gypsy Decant    CHIEF COMPLAINT: Routine follow-up visit for right breast cancer and melanoma    HISTORY OF PRESENT ILLNESS:    74 y/o Caucasian woman with mammographically detected clinical stage IIA T2N0 right breast invasive ductal carcinoma s/p bx 01/14/14 (SBR 9/9, ER/PR negative, Ki-67=50%, Her-2/neu FISH ratio=1.14 but copy number=6.6 so positive). MRI breast 01/29/14 showed mass to be 2.2 x 1.6 x 2.0 cm. Staging PET/CT 01/27/14 was negative for distant mets; showed only known right breast tumor and incidental left thyroid nodule. Planned neoadjuvant systemic therapy: TCH + P x 6 cycles followed by surgery and radiation therapy. Cycle #1 TCH+P given 02/17/14. Pt developed severe diarrhea and dehydration causing electrolyte derangements requiring daily replacement therapies and hydration. Therefore, chemotherapy was changed to weekly Taxol for 12 weeks and Herceptin plus Perjeta every 3 weeks was continued. Taxol started 03/10/14 and completed only 8 weeks on 04/28/14 due to cumulative severe side effects and MRI breast 04/30/14 showing only 38m of residual enhancement. Herceptin/Perjeta continued q3wks for 6 total cycles. Right lumpectomy + SLNB done 05/28/14 (6 mm residual IDC, grade 2, SBR 7/9, no LI, 0/1 LN; ypT1bN0; residual tumor had lower a mitotic score compared to initial biopsy specimen likely due to chemotherapy  effect; repeat ER and PR on residual tumor was again negative for both). Due to residual disease although minimal, pt received final dose (#6) of Perjeta 06/02/14. Herceptin single-agent q3wks started 06/23/14.     July 14, 2014 (Herceptin week #22)     Started single-agent Herceptin 06/23/14 and tolerated it well without side effects. Hair is starting to grow. Neuropathy in the fingers and bottoms of the feet continue but improve when she takes gabapentin 300 mg 3 times a day. She does not want to increase the dose since it is controlling her neuropathy and she does not want to increase fatigue due to medication side effect. [place] The only difference during that trip was that her friends were cooking all of her meals for her so a change in diet seems to have improved the diarrhea. Since her return, her diarrhea has resumed unchanged. She has about 6 watery episodes of diarrhea per day and has had 3 episodes thus far this morning. She continues Percocet as needed but also has a prior prescription for more tincture of opium which she will change back to since postop pain has resolved. She has not yet done repeat stool studies since she was on her trip but will do so soon with prior order. She has not seen the gastroenterologist yet but will ask her  primary care doctor for a referral to see one soon. Dysuria symptoms recurred this past weekend. Bactrim worked well in the past after the 1st dose. She saw Dr Marshall Cork 06/25/14 and will start xrt next week on 07/21/14. She receives hydration every 3 weeks with Herceptin but requests more frequent visits since her diarrhea is ongoing. Labs (CBC, CMP) 06/02/14 sig for H/H=10.6/32.9; gluc=221.   The patient completed the Herceptin every 3 weeks for total of 1 year of adjuvant therapy in 12-2014 and remains off therapy.     INTERVAL  HISTORY:    The patient went for a repeat breast MRI and had a BI-RADS category 4 finding in the right breast and underwent a right breast biopsy and it was negative. She remains on vitamin-D and calcium and weekly Alendronate for osteopenia and tolerating it well and no reported dental issues.She reports no recent falls. She is followed by dermatology for melanoma and reports no new skin lesions. She denies any bleeding. She is worried about COVID-19 but denies any fevers or chills or cough or shortness of breath and she had the vaccine and tolerate it well.    11-20-2019 1st vaccine Ali Chukson at [facility]  12-11-2019 2nd vaccine [facility] at [place]:     Reviewed and no changes.    Stage IIA T2N0 right breast IDC --> ypT1bN0   LUE DVT dx'ed by U/S 02/10/14 -- Lovenox started same day; resolved on 05/18/14 U/S; completed Lovenox 06/07/14   Stage I pT1aN0 melanoma of upper back s/p excision  DM II  Hyperlipidemia  Hypertension  BCC skin  Thyroid nodules bilaterally   She had a colonoscopy in 2018 and to be repeated in 2023.       PAST SURGICAL HISTORY:     Reviewed and no changes.    05/28/14 Right lumpectomy + SLNB   02/05/14 L. chest port placement ([facility])  07/04/11 Wide excision upper back melanoma + R. supraclavicular SLNB (lentigo melanoma and melanoma in situ, 0.75 mm, no residual invasive melanoma, 0/1 LN)   2013 Facelift  2012 Gastric bypass  1999 Cholecystectomy  1995 Left foot fracture repair with pins  Age 3 Tonsillectomy     PAST OB/GYN HISTORY:     Reviewed and no changes.     G0P0. Menarche at 24. OCP x 15 yrs; last age 89s. HRT late 30s for <22mhs; stopped after [facility] study results reported. LMP age 74      HEALTH MAINTENANCE:    Colo -- 3-4 yrs ago; repeat due next year   DXA -- 2015 osteopenia; prescribed Fosamax but never started   Pap -- 11/2013 (one prior abnl pap and polyp removed)      FAMILY  HISTORY:    Reviewed and no changes.    Mother -- died 868of CHF  Father -- died 847of decline after stroke  Sister -- died 471of NHL; another sister alive and well     SOCIAL HISTORY:    Reviewed and no changes    Lives with partner, [name]   Kids -- none   Retired [employer] developer   Tobacco -- only 4 yrs in early 20s, <1ppd  Alcohol - 1 glass wine/mth at most   Drugs - none      CURRENT MEDS:    I reviewed the medication list with the patient.    ALLERGIES:     No Known Allergies  REVIEW OF SYSTEMS (ROS):    A comprehensive 15-point ROS was performed and reviewed with the patient and is negative unless noted above.     EXAM:    Vitals 06/17/2018 12/17/2018 06/10/2019 07/16/2019 05/16/3663   Systolic 403 474 [phone]   Diastolic 73 79 80 80 73   Position _0    Site Left arm Left arm Right arm Left arm Left arm   Cuff Size Regular - Regular Regular Regular   Pulse 72 98 80 88 93   Resp _1 Temp 98.5 98.1 96.9 96.6 96.8   Temp Source _2 Weight (kg) 91.627 kg 95.1 kg 91.808 kg - 85.73 kg   Weight (lbs) 202 lb 209 lb 10.5 oz 202 lb 6.4 oz - 189 lb   Height (cm) 165.1 cm 165.1 cm 164.3 cm 164.3 cm 165.1 cm   Height (ft in) _3  _4  5' 4.7" 5' 4.7" _5    BMI (kg/m2) 33.61 kg/m2 34.89 kg/m2 33.99 kg/m2 - 31.45 kg/m2   BSA (m2) 2.05 m2 2.09 m2 2.05 m2 - 1.98 m2   Fall Risk No fall risk identified No fall risk identified No fall risk identified No fall risk identified No fall risk identified   Some recent data might be hidden     GENERAL: The patient is well developed and well nourished, ambulatory, in no acute distress.  HEENT: normocephalic/atraumatic, anicteric sclera.  HEART: RRR.  CHEST: Clear to auscultation bilaterally.  AXILLAE: No palpable lesions bilaterally.  BREASTS: Were examined bilaterally, no new palpable breast lesions, no nipple discharge bilaterally.   ABDOMEN: soft, non-tender, non-distended, normal bowel sounds, no  rebound or guarding, no appreciable masses  EXTREMITIES: No edema of the lower extremities.  SKIN:  No rash or jaundice.  NEURO: AOx3, EOMI, no focal deficits, gait steady.  PSYCHIATRIC: Good insight adequate mood and affect.    LAB DATA:    Reviewed the lab work with patient:    Results for Kayla, Joseph (MRN [id number])      Ref. Range 12/11/2019 11:02   Ferritin Latest Ref Range: 16 - 288 ng/mL 25   Iron Latest Ref Range: 45 - 160 mcg/dL 118   IIBC Latest Ref Range: 250 - 450 mcg/dL (calc) 349   Iron Saturation Latest Ref Range: 16 - 45 % (calc) 34     09-28-2019 Normal CMP showed glucose 122, creatinine 1.11, calcium 8.5, normal LFT's.     Results for Kayla, Joseph (MRN [id number])      Ref. Range 11/18/2015 09:37 02/17/2016 08:50 06/14/2016 09:33 05/28/2017 09:30 06/03/2019 10:14   Sodium Latest Ref Range: 135 - 146 mmol/L 139 138 137 138 136   Potassium Latest Ref Range: 3.5 - 5.3 mmol/L 5.1 4.9 4.7 5.4 (H)    Potassium Latest Ref Range: 3.4 - 4.8 mmol/L     4.7   Chloride Latest Ref Range: 98 - 110 mmol/L 106 104 101 107 104   Carbon Dioxide Latest Ref Range: 20 - 32 mmol/L _6 BUN Latest Ref Range: 7 - 25 mg/dL 23 30 (H) 29 (H) 21 19   Creatinine Latest Ref Range: 0.60 - 0.93 mg/dL 1.16 (H) 1.48 (H) 1.53 (H) 1.24 (H) 1.10 (H)   eGFR non-Afr.American Latest Ref Range: > OR = 60 mL/min/1.27m 47 (L) 35 (L) 34 (L) 43 (L) 49 (L)   eGFR African American  Latest Ref Range: > OR = 60 mL/min/1.55m 55 (L) 41 (L) 39 (L) 50 (L) 57 (L)   Glucose Latest Ref Range: 65 - 99 mg/dL 148 (H) 166 (H) 226 (H) 103 (H) 156 (H)   Calcium Latest Ref Range: 8.6 - 10.4 mg/dL 8.7 8.6 8.8 9.5 8.7   Protein, Total Latest Ref Range: 6.4 - 8.4 g/dL     7.2   Total Protein Latest Ref Range: 6.1 - 8.1 g/dL 6.7 6.6 6.9 6.7    Alkaline Phos Latest Ref Range: 37 - 153 U/L 126 98 116 83 117   AST (SGOT) Latest Ref Range: 10 - 35 U/L _0 ALT (SGPT) Latest Ref Range: 6 - 29 U/L _1 Albumin Latest Ref Range: 3.6 - 5.1  g/dL 4.1 3.9 4.1 4.1 4.1   Bilirubin, Total Latest Ref Range: 0.2 - 1.2 mg/dL 0.8 0.7 0.6 1.0 0.9   BUN/Creatinine Ratio Latest Ref Range: 6 - 22 (calc) _2 Ulysses 27.29 Latest Ref Range: <38 U/mL _3 Cholesterol Latest Ref Range: <200 mg/dL    112    LDL-Cholesterol Latest Units: mg/dL (calc)    41    HDL Cholesterol Latest Ref Range: >50 mg/dL    50 (L)    Chol/HDLC Ratio Latest Ref Range: <5.0 (calc)    2.2    Non-HDL Cholesterol Latest Ref Range: <130 mg/dL (calc)    62    Ferritin Latest Ref Range: 20 - 288 ng/mL  7 (L) 11 (L) 16 (L)    Folate Latest Units: ng/mL    11.8    Globulin Latest Ref Range: 1.9 - 3.7 g/dL (calc) 2.6 2.7 2.8 2.6    Globulin Latest Ref Range: 2.2 - 4.0 g/dL (calc)     3.1   Albumin/Glob Ratio Latest Ref Range: 1.0 - 2.5 (calc) 1.6 1.4 1.5 1.6    Albumin/Globulin Ratio Latest Ref Range: 0.9 - 2.3 (calc)     1.3   Hgb A1C Latest Ref Range: <5.7 % of total Hgb    6.0 (H)    Iron Latest Ref Range: 45 - 160 mcg/dL  30 (L) 60 115    IIBC Latest Ref Range: 250 - 450 mcg/dL (calc)  470 (H) 422 393    Iron Saturation Latest Ref Range: 11 - 50 % (calc)  6 (L) 14 29    Triglycerides Latest Ref Range: <150 mg/dL    129    Vitamin B12 Latest Ref Range: 200 - 1,100 pg/mL  191 (L) 677 611    Vitamin D, 25-OH, Total Latest Ref Range: 30 - 100 ng/mL  32 26 (L) 40 48   WBC Latest Ref Range: 3.8 - 10.8 Thousand/uL 6.4 5.2 6.6 5.3 7.3   RBC Latest Ref Range: 3.80 - 5.10 Million/uL 3.36 (L) 3.27 (L) 3.71 (L) 3.57 (L) 4.08   HGB Latest Ref Range: 11.7 - 15.5 g/dL 9.6 (L) 9.1 (L) 10.5 (L) 11.5 (L) 12.5   HCT Latest Ref Range: 35.0 - 45.0 % 29.0 (L) 28.1 (L) 32.4 (L) 33.3 (L) 37.1   MCV Latest Ref Range: 80.0 - 100.0 fL 86.3 85.9 87.3 93.3 90.9   MCH Latest Ref Range: 27.0 - 33.0 pg 28.4 27.8 28.3 32.2 30.6   MCHC Latest Ref Range: 32.0 - 36.0 g/dL 32.9 32.4 32.4 34.5 33.7   RDW Latest Ref Range: 11.0 -  15.0 % 13.8 13.2 14.4 11.9 12.0   PLT Latest Ref Range: 140 - 400 Thousand/uL 224 218  201 179 217   MPV Latest Ref Range: 7.5 - 12.5 fL 7.5 9.2 9.4 9.5 10.1   SEGS Latest Units: % 64.0 59.7 55 54.7 63.6   Lymps Latest Units: % 29.0 31.7 32.9 36.7 28.4   Monocytes Latest Units: % 6.3 7.4 8.3 7.3 7.3   Eosinophils Latest Units: % 0.5 1.0 3.5 0.9 0.6   Basophils Latest Units: % 0.2 0.2 0.3 0.4 0.1   BANDS Latest Units: %    CANCELED CANCELED   Metamyelocytes Latest Units: %    CANCELED CANCELED   Myelocytes Latest Units: %    CANCELED CANCELED   Promyelocytes Latest Units: %    CANCELED CANCELED   Blasts Latest Units: %    CANCELED CANCELED   Reactive Lymphs Latest Ref Range: 0 - 10 %    CANCELED CANCELED   NRBC Latest Ref Range: 0 /100 WBC    CANCELED CANCELED   Abs Neutrophils Latest Ref Range: 1,500 - 7,800 cells/uL 4,096 3,104 3,630 2,899 4,643   Abs Lymphs Latest Ref Range: 850 - 3,900 cells/uL 1,856 1,648 2,171 1,945 2,073   Abs Monocytes Latest Ref Range: 200 - 950 cells/uL 403 385 548 387 533   Abs Eosinophils Latest Ref Range: 15 - 500 cells/uL 32 52 231 48 44   Abs Basophils Latest Ref Range: 0 - 200 cells/uL _0 Abs Band Neutrophils Latest Ref Range: 0 - 750 cells/uL    CANCELED CANCELED   Abs Metamyelocytes Latest Ref Range: 0 cells/uL    CANCELED CANCELED   Abs Myelocytes Latest Ref Range: 0 cells/uL    CANCELED CANCELED   Abs Promyelocytes Latest Ref Range: 0 cells/uL    CANCELED CANCELED   Abs Blasts Latest Ref Range: 0 cells/uL    CANCELED CANCELED   Abs NRBC Latest Ref Range: 0 cells/uL  0 0 0 0   Comments Unknown    CANCELED CANCELED   Retic %, Auto Latest Units: %  1.2      Retic Absolute Latest Ref Range: 20,000 - 80,000 cells/uL  39,240        IMAGING DATA:    Reviewed the imaging with patient:    12-10-2019 DEXA showed osteoporosis is at T score -2.8.     07-01-2019 MRI of breasts showed     Result Impression   IMPRESSION:    1. Right breast: BI-RADS Category 4 - Suspicious. Recommend US-guided core needle biopsy lumpectomy scar at 11:00 location given nodular plateau  enhancement seen at the superior aspect of the scar. Findings and recommendations discussed with the patient on 07/01/19 at 12:30 pm.    2. Left breast: BI-RADS Category 1 - Negative.    OVERALL ASSESSMENT: BI-RADS Category 4.     05-26-2019 Mammogram showed   Result Impression     Area of distortion consistent with post surgical scarring in the left breast is benign. The findings and recommendations were discussed with the patient by Dr. Ricki Miller at the conclusion of this examination.    Suggest this patient return for her routine annual screening mammogram in 1 year.    ACR BI-RADS Category 2 - Benign Finding.     06-09-2018 Mammogram showed    Result Impression     Stable post lumpectomy scarring and radiation therapy change in the right breast is benign. Note that distortion from scarring  can obscure underlying lesions.    Suggest this patient return for her routine annual screening mammogram in 1 year.    ACR BI-RADS Category 2 - Benign Finding.     June 03, 2017 bone density shows osteopenia T-score of -2.4 in the left femoral neck.    06-03-2017 Mammogram showed Stable post lumpectomy scarring and radiation therapy change in the right breast is benign.    Suggest this patient return for her routine annual screening mammogram in 1 year.    ACR BI-RADS Category 2 -Benign Finding.    05/24/2016 bilateral diagnostic 2D and 3D mammogram shows stable post lumpectomy changes in the right breast are benign.  December 27, 2015 echocardiogram shows a normal ejection fraction.  05-24-2015 bilateral diagnostic mammogram and bilateral breast ultrasound showed new post lumpectomy scar in the right breast is benign, stable punctate calcifications in the area areolar region of the right breast are benign, stable punctate calcifications in the left breast at 6 o'clock are benign with no evidence of malignancy.  11/22/2014 bilateral 2D and 3D mammogram shows new punctate calcifications in the periareolar region of the right  breast are probably benign in mammographic follow-up in 6 months is recommended, new post lumpectomy scar in the right breast is benign. New punctate calcifications in the left breast at 6 o'clock up probably benign.  11/22/2014 bone density shows osteoporosis with a T-score of-2.6 in the right femoral neck.  August 31, 2014 echocardiogram shows left ventricular diastolic dysfunction, normal left ventricular size and systolic function.    PATHOLOGY:    I reviewed the report with her:    07/07/2019 RIGHT BREAST, 11:00 AT 7 CM, CORE BIOPSY:  Fat necrosis and scar, consistent with prior procedure site changes.  No carcinoma identified.    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  09/27/2014 left thyroid nodule atypical follicular lesion of undetermined significance, a right thyroid nodule fine-needle aspiration negative for malignancy.    IMPRESSION/PLAN:    Problem # 1: BREAST CANCER      Initial clinical stage IIA pT2N0 right IDC s/p bx 01/14/14 (ER/PR negative, Her-2 positive by copy number = 6.6, Ki-67=50%). MRI breasts 01/29/14 showed lesion to be 2.2 x 1.6 x 2.0 cm. Staging PET/CT negative for distant mets. Neoadjuvant chemotherapy with Her-2 targeted therapy was suggested, since tumor size > 2cm, so recommended TCH + P (Taxotere, Carboplatin, Herceptin, Perjeta) q3wks x 6 cycles. Systemic therapies started 02/17/14 but pt developed severe diarrhea causing dehydration and electrolyte abnormalities requiring daily hydration and electrolyte replacements so changed regimen to weekly Taxol x 12 weeks with Herceptin/Perjeta q3wks and tolerated only slightly better. Right breast mass resolved on physical exam. Pt was becoming weaker due to ongoing diarrhea on Taxol so discontinued after 04/28/14 dose with plan for repeat breast imaging followed by surgery if response was  good. Breast imaging 04/28/14 (mammo, u/s) and MRI 04/30/14 showed minimal residual disease so Taxol was discontinued after 04/28/14 dose (week #8) and Herceptin/Perjeta continued. Right lumpectomy + SLNB 05/28/14 showed 6 mm residual disease, grade 2, SBR=7/9 (lower mitotic rate than bx), 0/1 LN; repeat ER/PR negative. Since only 5 doses of Perjeta were given pre-operatively and there was residual disease in surgical specimen, she was given the final 6th dose of Perjeta 06/02/14 with usual q3wk dose of Herceptin. Single-agent Herceptin continued q3wk and completed a 1-yr course in 12-2014  and since then no therapy. The radiation therapy was completed.Clinically no new findings in the most recent was reviewed a repeat breast MRI was ordered.  The repeat breast MRI showed a BI-RADS category 4 in the right breast for which she under went a biopsy and it confirmed only fat necrosis.  I explained how this differs from a malignancy.  She is wondering if it is due to trauma due to her puppy since she has never had fat grafting.  No further intervention is necessary will continue to monitor.  She was encouraged to continue breast self-examination.    Problem # 2: ACUTE DVT OF UPPER EXTREMITY      LUE acute DVT dx'ed by u/s 02/10/14 due to sudden onset of left arm swelling, completed anticoagulation and no evidence of recurrent thrombosis. May consider to remove PORT was concerned about chance to develop DVT but advised that removing the PORT does not usually cause a DVT.    Problem # 3: ABNORMAL ECHO      Baseline ECHO 02/10/14 with normal EF of 65-70% but with mild diastolic dysfunction. I advised her to continue f/u with Dr. Reubin Milan for monitoring of cardiac function and recommended again a repeat ECHO and will retrieve the report from 2019.    Problem # 4: THYROID NODULE       She had a total thyroidectomy and no malignancy found. Follow-up with Endocrinology or now PCP.    PROBLEM # 5 OSTEOPOROSIS    Repeat bone  density was reviewed now she has osteopenia and remains on Fosamax and also vitamin-D and calcium. Repeat DEXA reviewed and now has osteoporosis and discussed Prolia and gave educational material and needs to obtain dental clearance.    PROBLEM # 6 MELANOMA    No new complaints and followed by dermatology. Discussed signs and symptoms suggestive of recurrent disease.    Cancer Patients and Survivors and COVID19    We reviewed the preliminary guidelines published 11-13-2019: [email or web address]-[email or web address]    Adapted from https://www.thelancet.com/journals/lanonc/article/[email or web address])[email or web address]/fulltext    Our results show that patients with cancer with different tumour types have differing susceptibility to SARS-CoV-2 and differing COVID-19 disease phenotypes, with notable increased SARS-CoV-2 hospital presentations in patients with haematological cancers. We generated individualised risk tables for patients with cancer, displaying the effect of age and sex and tumour subtype.  There are variations and challenges in determining if COVID-19 was the direct cause of death for a patient, or if death was caused by a terminal event in a patient who was approaching the end of their cancer care. We analysed the all-cause case-fatality rate in patients with COVID-19 and cancer, which was a strength of this study, in addition to the comparison with a general cancer population control group.  Patients with haematological malignancies (leukaemia, lymphoma, and myeloma) are overrepresented, which is perhaps suggestive of an a priori increased susceptibility to viral infection. Patients with extranodal natural killer/T-cell lymphoma (ICD-10 code C86), Ivonne Andrew (C88), and unspecified neoplasm of lymphoid, haematopoietic and related tissue (C96) were greatly overrepresented. The reasons for this are unclear because of the small number of patients involved and  stochastic effects.    Large COVID-19 cancer cohorts of predominantly solid organ tumours have shown no significant excess mortality risk from recent chemotherapy. In this study, we identified in multivariable analyses that risk appears to be heightened by recent (within 4 weeks) or current chemotherapy in patients with haematological malignancies, similar to findings in other cohorts.  There could be several reasons for these  observations. The immunological disruption observed in patients with leukaemia and the use of intensely myelosuppressive treatment regimens might result in a combination of risks, in terms of the likelihood of initial SARS-CoV-2 infection, its ability to gain a foothold in the host, and in terms of the downstream disease course and likelihood of severe consequences, such as cytokine storm and multi-organ failure. Therefore, further validation of these findings and more in-depth research into the possible causes is important.    Adapted from https://www.oncnursingnews.com/web-exclusives/cancer-and-covid19-death-whats-the-correlation July 30, 2019    Cancer is independently associated with mortality in patients admitted to the hospital due to coronavirus disease 2019 (COVID-19) infection, according to data from The Clinical Characterization Protocol [place] (CCP-[place]) study, organized by the International Severe Acute Respiratory and emerging Infections Consortium ([place] (CO-CIN).    Insights from the [place] study [phone]), which comprises [place]'s largest prospective dataset, included [phone] patients in the [place] with complete outcomes, 10.5% of whom had cancer, 2.5% of whom were receiving active treatment for their cancer, and 8.0% of whom had a history of cancer upon data extraction on June 08, 2019.Data made available at the virtual 2020 ESMO Congress showed that, at the time of the presentation, 44.3%, 42.3%, and 29.5% of patients with  a history of cancer, patients receiving anti-cancer therapy, and patients without cancer or a history of cancer had died, respectively.    In line with these findings, additional results demonstrated a "significantly higher" 30-day mortality rate among patients with cancer compared with patients who did not have cancer, according to Cornelious Bryant, BSc, MB, BS, PhD, Alphonzo [place], who presented the findings (40.5% vs 28.5%; unadjusted HR, 1.62; 95% CI, 1.56-1.68; P <.001).    When stratified by age, 30-day mortality was consistently higher in all age-specific subgroups of patients with cancer versus patients without cancer. Among the 4 age-defined subsets studied--less than 50 years; 99 to 24 years; 54 to 40 years; and older than 72 years--30-day mortality was highest in patients with cancer in this last group (46.8% vs 42.5%, no cancer).    Adapted from the CDC "Symptoms and Caring for Yourself at Home"    Discussed signs and symptoms of COVID19 and advised to immediately contact us for any new problems.     Reported illnesses have ranged from mild symptoms to severe illness and death for confirmed coronavirus disease 2019 (COVID-19) cases.    These symptoms may appear 2-14 days after exposure (based on the incubation period of MERS-CoV viruses).    Coronavirus symptoms include, but are not limited to:    Marland Kitchen Fever or chills  . Cough  . Shortness of breath or difficulty breathing  . Fatigue  . Muscle or body aches  . Headache  . New loss of taste or smell  . Sore throat  . Congestion or runny nose  . Nausea or vomiting  . Diarrhea    If you have possible or confirmed COVID-19:  . Stay home from work, school, and away from other public places.   . If you must go out, avoid using any kind of public transportation, ridesharing, or taxis.   . Monitor your symptoms carefully.   . If your symptoms get worse, call your healthcare provider immediately.   . Get rest and stay hydrated. If you have a medical appointment, call the  healthcare provider ahead of time and tell them that you have or may have COVID-19.   . For medical emergencies, call 911 and notify the dispatch personnel  that you have or may have COVID-19.   . Cover your cough and sneezes.   Wendee Copp your hands often with soap and water for at least 20 seconds or clean your hands with an alcohol-based hand sanitizer that contains at least 60% alcohol.   . As much as possible, stay in a specific room and away from other people in your home.   . Also, you should use a separate bathroom, if available.   . If you need to be around other people in or outside of the home, wear a facemask.   Marland Kitchen Avoid sharing personal items with other people in your household, like dishes, towels, and bedding.   . Clean all surfaces that are touched often, like counters, tabletops, and doorknobs. Use household cleaning sprays or wipes according to the label instructions.    If you develop emergency warning signs for COVID-19 get medical attention immediately. Emergency warning signs include*:  . Trouble breathing   . Persistent pain or pressure in the chest   . New confusion or inability to arouse   . Bluish lips or face  *This list is not all inclusive. Please consult your medical provider for any other symptoms that are severe or concerning.    There is no specific antiviral treatment recommended for COVID-19. People with COVID-19 should receive supportive care to help relieve symptoms. For severe cases, treatment should include care to support vital organ functions.    Preliminary evidence suggests that COVID-19 not only detrimentally affects respiratory organs, but also can result in "heart inflammation, acute kidney disease, neurological malfunction, blood clots, intestinal damage and liver problems."    People who think they may have been exposed to COVID-19 should contact their healthcare provider immediately.    PLAN:    1. Completed single-agent Herceptin q3wks to complete 2yrtotal course (06/23/14  thru 01/19/15).  Repeat breast MRI was ordered and reviewed and the biopsy from the right breast confirming no malignancy.  2. Anemia now resolved and her under creatinine improved.  3. ECHO to be repeated as per Dr. RReubin Milanof cardiology.    4. Thyroidectomy done due to atypical left thyroid nodule and no malignancy, follow-up with Endocrinology.  5. Renal insufficiency and diabetes may contribute to anemia but the anemia is no currently an issue.  6. History of melanoma and she is followed by Dermatology.  7. Due for repeat bone density which was ordered.    Orders Placed This Encounter   Procedures   . Diagnostic Mammogram With Digital Breast Tomosynthesis - Bilateral   . Iron, TIBC and Ferritin Panel - [facility]   . CBC w/ Diff Lavender   . Comprehensive Metabolic Panel   . [place] 27.29, BLOOD   . Vitamin D, 25-OH Total Yellow serum separator tube     Thank you very much for allowing our continued participation in the care of this patient.     Medical decision-making was highly complex  and more than 50% of this _40_ min visit was spent in educating the patient about their condition, discussing compliance issues, counseling including answering all of the patients questions and coordination of care. This included review of relevant laboratory, radiology and other diagnostic tests, explaining medical management choices and the patient verbalized understanding.  RTO: 4-6 months    I certify that I have reviewed the documentation contained in this clinical record and that it is accurately recorded. This document contains private and confidential health information protected by state and federal law and any release of this information requires the written prior authorization of the above mentioned patient.    Please note this report was dictated with the use of voice recognition software.  It may contain inadvertent spelling or grammatical errors  which were not detected during the editing process.       Take a virtual tour of our facility: [email or web address]    Clinical trials: [email or web address]        --------------------------------------  [name], MD, MBA

## 2019-12-17 NOTE — Patient Instructions (Signed)
Orders Placed This Encounter   Procedures   . Diagnostic Mammogram With Digital Breast Tomosynthesis - Bilateral   . Iron, TIBC and Ferritin Panel - [facility]   . CBC w/ Diff Lavender   . Comprehensive Metabolic Panel   . [place] 27.29, BLOOD   . Vitamin D, 25-OH Total Yellow serum separator tube

## 2020-01-06 ENCOUNTER — Other Ambulatory Visit: Payer: Self-pay | Admitting: Nurse Practitioner

## 2020-01-06 DIAGNOSIS — G629 Polyneuropathy, unspecified: Secondary | ICD-10-CM

## 2020-01-06 MED ORDER — GABAPENTIN 300 MG OR CAPS
300.0000 mg | ORAL_CAPSULE | Freq: Three times a day (TID) | ORAL | 3 refills | Status: DC
Start: 2020-01-06 — End: 2020-07-04

## 2020-01-06 NOTE — Telephone Encounter (Signed)
Gabapentin 300 mg   Take one by mouth 3 times a day  Disp: 30  RF: 3    DX: neuropathy    Refill request prompted for Dr. Dorris Singh   LV 12-17-2019  F/U 06-15-2020    MVillaverde RN

## 2020-02-20 ENCOUNTER — Other Ambulatory Visit: Payer: Self-pay | Admitting: Hematology & Oncology

## 2020-02-20 DIAGNOSIS — M8580 Other specified disorders of bone density and structure, unspecified site: Secondary | ICD-10-CM

## 2020-02-22 MED ORDER — ALENDRONATE SODIUM 70 MG OR TABS
70.0000 mg | ORAL_TABLET | ORAL | 0 refills | Status: DC
Start: 2020-02-22 — End: 2020-03-23

## 2020-03-22 ENCOUNTER — Other Ambulatory Visit: Payer: Self-pay | Admitting: Hematology & Oncology

## 2020-03-22 DIAGNOSIS — M8580 Other specified disorders of bone density and structure, unspecified site: Secondary | ICD-10-CM

## 2020-03-23 MED ORDER — ALENDRONATE SODIUM 70 MG OR TABS
70.0000 mg | ORAL_TABLET | ORAL | 0 refills | Status: DC
Start: 2020-03-23 — End: 2020-04-19

## 2020-04-16 ENCOUNTER — Other Ambulatory Visit: Payer: Self-pay | Admitting: Hematology & Oncology

## 2020-04-16 DIAGNOSIS — M8580 Other specified disorders of bone density and structure, unspecified site: Secondary | ICD-10-CM

## 2020-04-19 MED ORDER — ALENDRONATE SODIUM 70 MG OR TABS
70.0000 mg | ORAL_TABLET | ORAL | 0 refills | Status: DC
Start: 2020-04-19 — End: 2020-05-26

## 2020-04-28 IMAGING — MG MA Digital Screening Mammogram
8 series · 8 of 24 positions shown · non-contrast
Comparison: none

INDICATIONS FOR EXAMINATION:                                                              
 Patient History:                                                                          
 Menarche at age 10. First Full-Term Pregnancy at age 18. Postmenopausal.                  
 Last mammogram was performed 2 year(s) and 4 month(s) ago.
REASON FOR EXAM: Screening  (asymptomatic).

[L CC synth-2D]
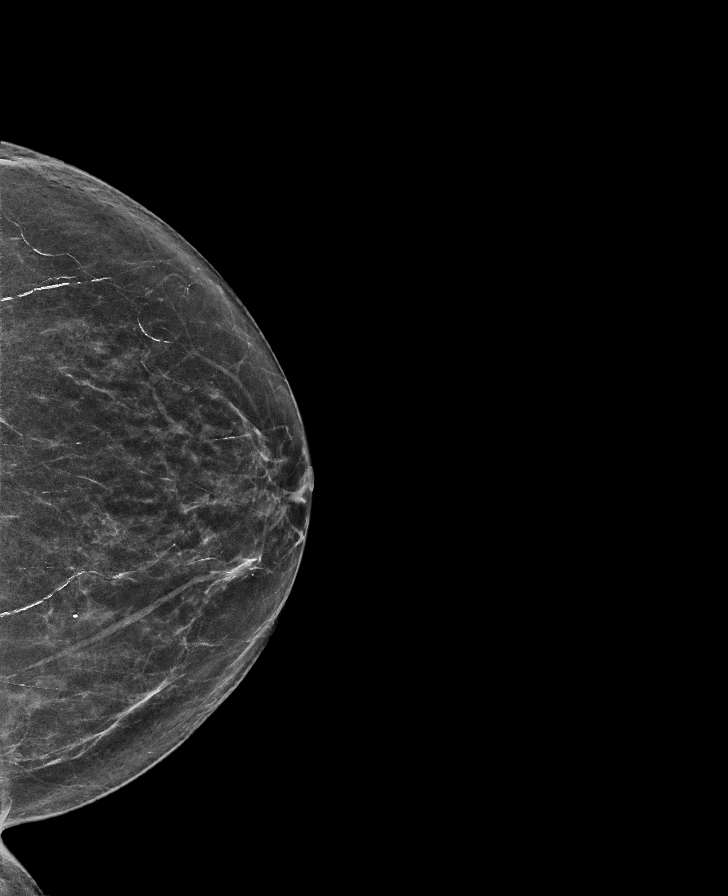

[R CC synth-2D]
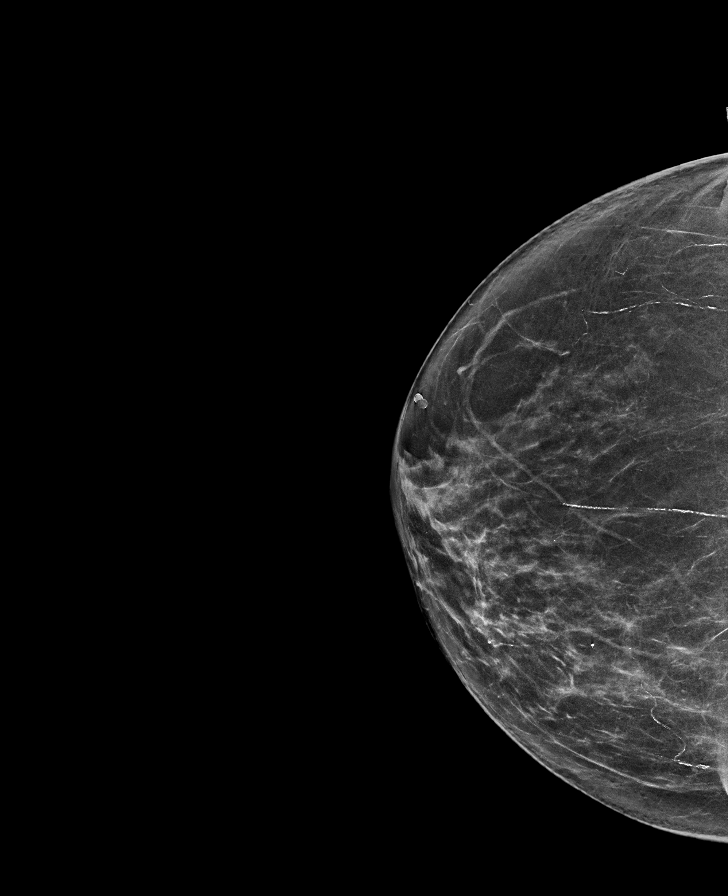

[R MLO synth-2D]
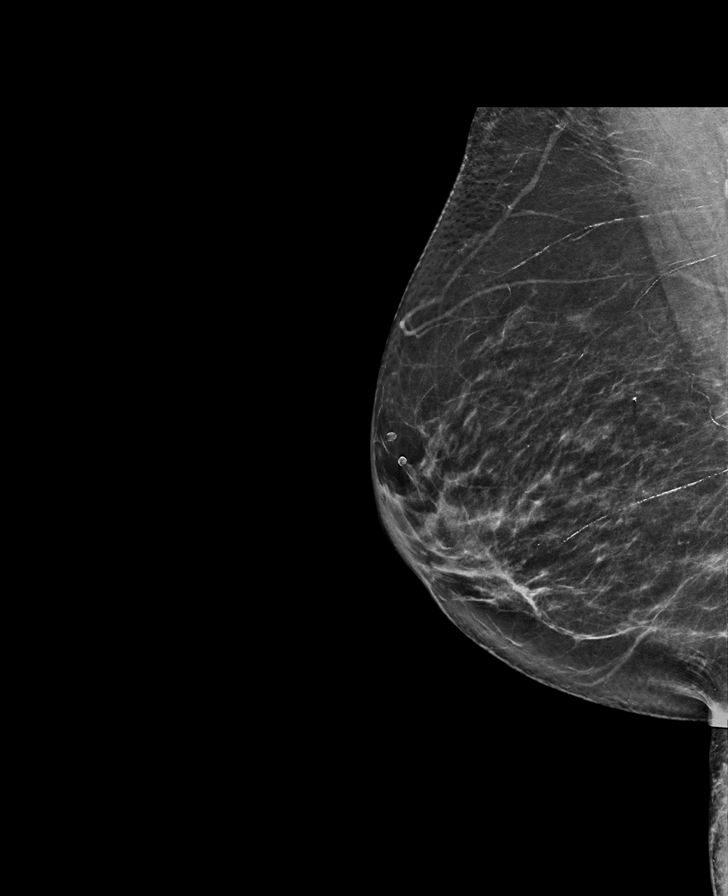

[L MLO synth-2D]
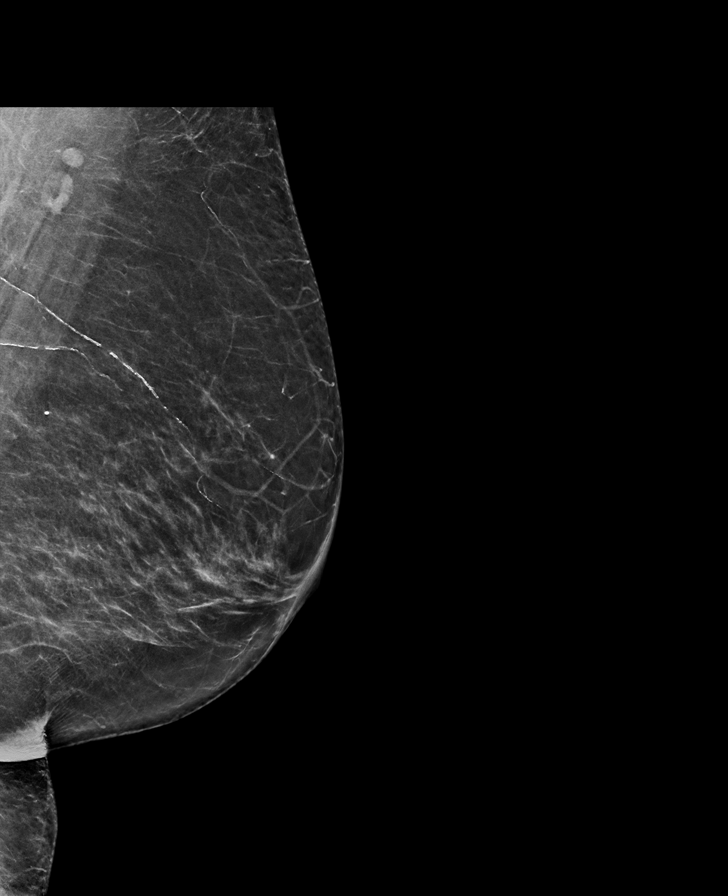

[L CC tomo · tomo slice 34/67.0]
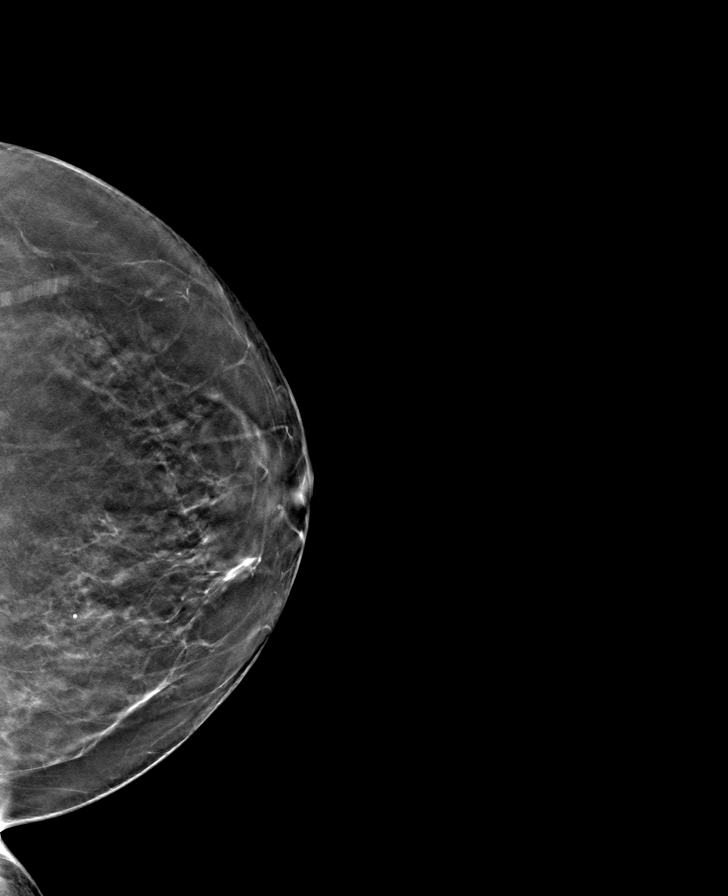

[R MLO tomo · tomo slice 34/67.0]
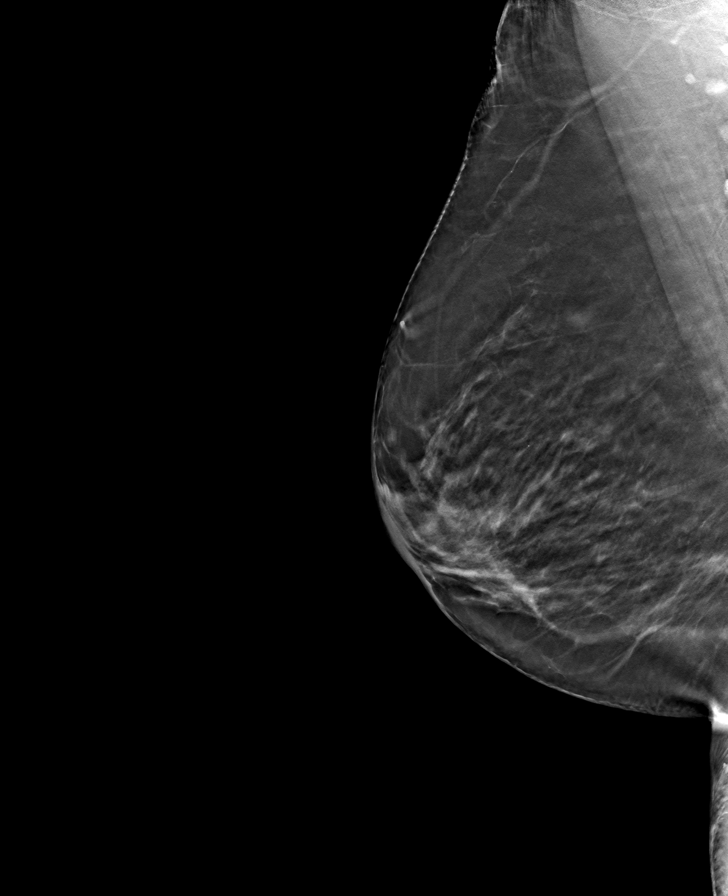

[L MLO tomo · tomo slice 35/70.0]
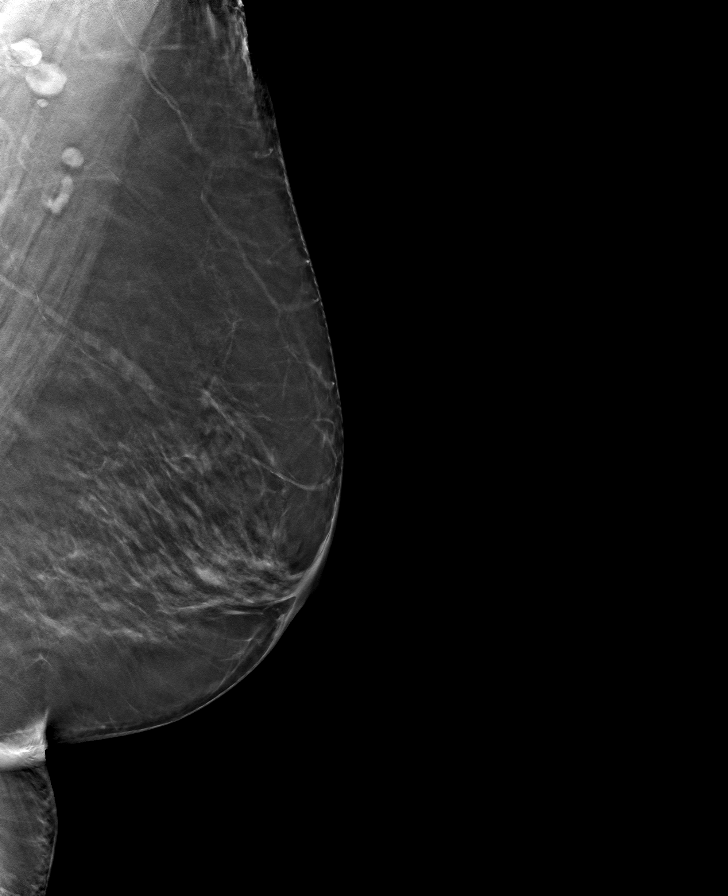

[R CC tomo · tomo slice 35/69.0]
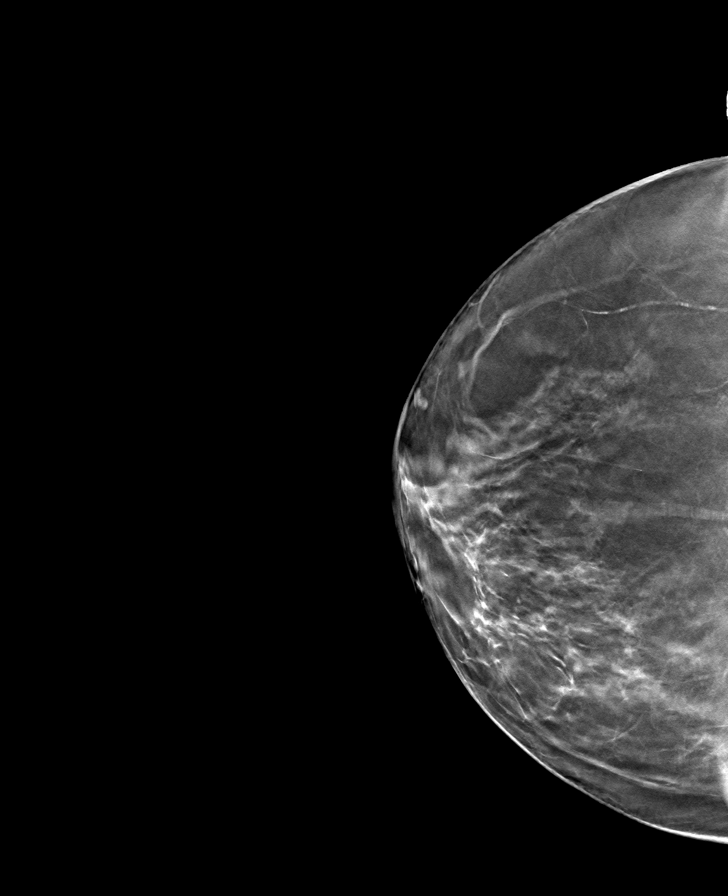

[8 of 24 positions shown; findings below may reference images not displayed]

Risk values:                                                                              
 Tyrer-Cuzick 10 year model risk: 1.8%.                                                    
 Tyrer-Cuzick Lifetime model risk: 1.8%.                                                   
 NCI Lifetime model risk: 3.2%.                                                            
 MRS Risk Manager: No High Risk calculations found at this time.                           

 DIGITAL IMAGE VIEWS:                                                                      
 Bilateral CC views were taken.                                                            
 Bilateral MLO views were taken.                                                           
 Bilateral 3D Tomo views were taken.                                                       

 PRIOR STUDY COMPARISON(S):                                                                
 12/18/2011 Bilateral Screening Mammogram, [HOSPITAL]. 12/24/2011 Right Diagnostic Mammogram, [HOSPITAL].   
 11/19/2013                                                                                 
 Bilateral Screening Mammogram, [HOSPITAL]. 12/28/2015 Bilateral Screening Mammogram, [HOSPITAL].          
 01/14/2018 Bilateral                                                                       
 Screening Mammogram, [HOSPITAL].                                                                 

 TISSUE DENSITY:                                                                           
 There are scattered areas of fibroglandular density.
FINDINGS: Analyzed By CAD.                                                                          
 2D/3D tomographic images were obtained. There is no mammographic evidence of              
 malignancy.                                                                               

 OVERALL ACR BI-RADS ASSESSMENT: BI-RADS CATEGORY 1: NEGATIVE                              

 RECOMMENDATION:                                                                           
 Screening Mammogram of both breasts in 1 year.

## 2020-05-26 ENCOUNTER — Other Ambulatory Visit: Payer: Self-pay | Admitting: Hematology & Oncology

## 2020-05-26 DIAGNOSIS — M8580 Other specified disorders of bone density and structure, unspecified site: Secondary | ICD-10-CM

## 2020-05-26 MED ORDER — ALENDRONATE SODIUM 70 MG OR TABS
70.0000 mg | ORAL_TABLET | ORAL | 0 refills | Status: DC
Start: 2020-05-26 — End: 2020-06-03

## 2020-06-03 ENCOUNTER — Other Ambulatory Visit: Payer: Self-pay | Admitting: Hematology & Oncology

## 2020-06-03 DIAGNOSIS — M8580 Other specified disorders of bone density and structure, unspecified site: Secondary | ICD-10-CM

## 2020-06-03 MED ORDER — ALENDRONATE SODIUM 70 MG OR TABS
70.0000 mg | ORAL_TABLET | ORAL | 0 refills | Status: DC
Start: 2020-06-03 — End: 2020-07-04

## 2020-06-15 ENCOUNTER — Ambulatory Visit: Payer: Medicare Other | Admitting: Hematology & Oncology

## 2020-06-16 LAB — CBC WITH DIFF, BLOOD
Abs Basophils: 12 cells/uL (ref 0–200)
Abs Eosinophils: 29 cells/uL (ref 15–500)
Abs Lymphs: 2152 cells/uL (ref 850–3900)
Abs Monocytes: 429 cells/uL (ref 200–950)
Abs NRBC: 0 cells/uL
Abs Neutrophils: 3178 cells/uL (ref 1500–7800)
Basophils: 0.2 %
Eosinophils: 0.5 %
HCT: 33.5 % — ABNORMAL LOW (ref 35.0–45.0)
HGB: 11.1 g/dL — ABNORMAL LOW (ref 11.7–15.5)
Lymps: 37.1 %
MCH: 30.2 pg (ref 27.0–33.0)
MCHC: 33.1 g/dL (ref 32.0–36.0)
MCV: 91.3 fL (ref 80.0–100.0)
MPV: 10 fL (ref 7.5–12.5)
Monocytes: 7.4 %
PLT: 193 10*3/uL (ref 140–400)
RBC: 3.67 10*6/uL — ABNORMAL LOW (ref 3.80–5.10)
RDW: 11.9 % (ref 11.0–15.0)
SEGS: 54.8 %
WBC: 5.8 10*3/uL (ref 3.8–10.8)

## 2020-06-16 LAB — COMPREHENSIVE METABOLIC PANEL, BLOOD
ALT (SGPT): 6 U/L (ref 6–29)
AST (SGOT): 12 U/L (ref 10–35)
Albumin/Glob Ratio: 1.4 (calc) (ref 1.0–2.5)
Albumin: 3.9 g/dL (ref 3.6–5.1)
Alkaline Phos: 96 U/L (ref 37–153)
BUN/Creatinine Ratio: 21 (calc) (ref 6–22)
BUN: 25 mg/dL (ref 7–25)
Bilirubin, Total: 1.3 mg/dL — ABNORMAL HIGH (ref 0.2–1.2)
Calcium: 8.7 mg/dL (ref 8.6–10.4)
Carbon Dioxide: 22 mmol/L (ref 20–32)
Chloride: 108 mmol/L (ref 98–110)
Creatinine: 1.17 mg/dL — ABNORMAL HIGH (ref 0.60–0.93)
Globulin: 2.7 g/dL (calc) (ref 1.9–3.7)
Glucose: 98 mg/dL (ref 65–99)
Potassium: 5.2 mmol/L (ref 3.5–5.3)
Sodium: 139 mmol/L (ref 135–146)
Total Protein: 6.6 g/dL (ref 6.1–8.1)
eGFR African American: 52 mL/min/{1.73_m2} — ABNORMAL LOW (ref >=60)
eGFR non-Afr.American: 45 mL/min/{1.73_m2} — ABNORMAL LOW (ref >=60)

## 2020-06-16 LAB — IRON, TIBC AND FERRITIN PANEL - QUEST
Ferritin: 16 ng/mL (ref 16–288)
IIBC: 380 mcg/dL (calc) (ref 250–450)
Iron Saturation: 34 % (calc) (ref 16–45)
Iron: 131 ug/dL (ref 45–160)

## 2020-06-16 LAB — VITAMIN D, 25-OH TOTAL: Vitamin D, 25-OH, Total: 35 ng/mL (ref 30–100)

## 2020-06-16 LAB — ~~LOC~~ 27.29, BLOOD: ~~LOC~~ 27.29: 17 U/mL (ref <38)

## 2020-06-22 ENCOUNTER — Encounter: Payer: Self-pay | Admitting: Hematology & Oncology

## 2020-06-22 ENCOUNTER — Ambulatory Visit (INDEPENDENT_AMBULATORY_CARE_PROVIDER_SITE_OTHER): Payer: Medicare Other | Admitting: Hematology & Oncology

## 2020-06-22 VITALS — BP 137/77 | HR 73 | Temp 97.3°F | Resp 18 | Ht 65.0 in | Wt 191.0 lb

## 2020-06-22 DIAGNOSIS — M810 Age-related osteoporosis without current pathological fracture: Secondary | ICD-10-CM

## 2020-06-22 DIAGNOSIS — Z171 Estrogen receptor negative status [ER-]: Secondary | ICD-10-CM

## 2020-06-22 DIAGNOSIS — C50411 Malignant neoplasm of upper-outer quadrant of right female breast: Secondary | ICD-10-CM

## 2020-06-22 NOTE — Progress Notes (Signed)
Jenet Durio A. Dorris Singh, MD, MBA  Clinical Professor  Division of Hematology/Oncology, Department of Medicine  Medical Director, [address], [place] [address]  Tel: [phone]  Fax: [phone]                                        HEMATOLOGY ONCOLOGY FOLLOW UP NOTE     Date of Service:   06/22/2020       Mosetta Pigeon, DOB 10/31/1945    REFERRING MD: Bonney Leitz  Radiation oncology: Marshall Cork  PCP: [name]  GYN: [name]   Cardiology: Gypsy Decant    CHIEF COMPLAINT: Routine follow-up visit for right breast cancer and melanoma    HISTORY OF PRESENT ILLNESS:    74 y/o Caucasian woman with mammographically detected clinical stage IIA T2N0 right breast invasive ductal carcinoma s/p bx 01/14/14 (SBR 9/9, ER/PR negative, Ki-67=50%, Her-2/neu FISH ratio=1.14 but copy number=6.6 so positive). MRI breast 01/29/14 showed mass to be 2.2 x 1.6 x 2.0 cm. Staging PET/CT 01/27/14 was negative for distant mets; showed only known right breast tumor and incidental left thyroid nodule. Planned neoadjuvant systemic therapy: TCH + P x 6 cycles followed by surgery and radiation therapy. Cycle #1 TCH+P given 02/17/14. Pt developed severe diarrhea and dehydration causing electrolyte derangements requiring daily replacement therapies and hydration. Therefore, chemotherapy was changed to weekly Taxol for 12 weeks and Herceptin plus Perjeta every 3 weeks was continued. Taxol started 03/10/14 and completed only 8 weeks on 04/28/14 due to cumulative severe side effects and MRI breast 04/30/14 showing only 70m of residual enhancement. Herceptin/Perjeta continued q3wks for 6 total cycles. Right lumpectomy + SLNB done 05/28/14 (6 mm residual IDC, grade 2, SBR 7/9, no LI, 0/1 LN; ypT1bN0; residual tumor had lower a mitotic score compared to initial biopsy specimen likely due to chemotherapy  effect; repeat ER and PR on residual tumor was again negative for both). Due to residual disease although minimal, pt received final dose (#6) of Perjeta 06/02/14. Herceptin single-agent q3wks started 06/23/14.     July 14, 2014 (Herceptin week #22)     Started single-agent Herceptin 06/23/14 and tolerated it well without side effects. Hair is starting to grow. Neuropathy in the fingers and bottoms of the feet continue but improve when she takes gabapentin 300 mg 3 times a day. She does not want to increase the dose since it is controlling her neuropathy and she does not want to increase fatigue due to medication side effect. [place] The only difference during that trip was that her friends were cooking all of her meals for her so a change in diet seems to have improved the diarrhea. Since her return, her diarrhea has resumed unchanged. She has about 6 watery episodes of diarrhea per day and has had 3 episodes thus far this morning. She continues Percocet as needed but also has a prior prescription for more tincture of opium which she will change back to since postop pain has resolved. She has not yet done repeat stool studies since she was on her trip but will do so soon with prior order. She has not seen the gastroenterologist  yet but will ask her primary care doctor for a referral to see one soon. Dysuria symptoms recurred this past weekend. Bactrim worked well in the past after the 1st dose. She saw Dr Marshall Cork 06/25/14 and will start xrt next week on 07/21/14. She receives hydration every 3 weeks with Herceptin but requests more frequent visits since her diarrhea is ongoing. Labs (CBC, CMP) 06/02/14 sig for H/H=10.6/32.9; gluc=221.   The patient completed the Herceptin every 3 weeks for total of 1 year of adjuvant therapy in 12-2014 and remains off therapy.     INTERVAL  HISTORY:    The patient went for a repeat breast MRI and had a BI-RADS category 4 finding in the right breast and underwent a right breast biopsy and it was negative. She is due for a repeat mammogram and asking for the order. She remains on vitamin-D and calcium and weekly Alendronate for osteopenia and tolerating it well and no reported dental issues.She reports no recent falls. She is followed by dermatology for melanoma and reports a new skin lesion c/w SCCA but not melanoma was removed from her ant. Skull by Moh's and the wound is healing well. She denies any bleeding. She is worried about COVID-19 but denies any fevers or chills or cough or shortness of breath and she had the vaccine and tolerate it well and she is wondering about the booster.    11-20-2019 1st vaccine [facility]  12-11-2019 2nd vaccine [place] [facility] HISTORY:     Reviewed and no changes.    Stage IIA T2N0 right breast IDC --> ypT1bN0   LUE DVT dx'ed by U/S 02/10/14 -- Lovenox started same day; resolved on 05/18/14 U/S; completed Lovenox 06/07/14   Stage I pT1aN0 melanoma of upper back s/p excision  DM II  Hyperlipidemia  Hypertension  BCC skin  Thyroid nodules bilaterally   She had a colonoscopy in 2018 and to be repeated in 2023.       PAST SURGICAL HISTORY:     Reviewed and no changes.    05/28/14 Right lumpectomy + SLNB   02/05/14 L. chest port placement ([facility])  07/04/11 Wide excision upper back melanoma + R. supraclavicular SLNB (lentigo melanoma and melanoma in situ, 0.75 mm, no residual invasive melanoma, 0/1 LN)   2013 Facelift  2012 Gastric bypass  1999 Cholecystectomy  1995 Left foot fracture repair with pins  Age 27 Tonsillectomy     PAST OB/GYN HISTORY:     Reviewed and no changes.     G0P0. Menarche at 31. OCP x 15 yrs; last age 89s. HRT late 49s for <30mhs; stopped after WHI study results reported. LMP age 74      HEALTH  MAINTENANCE:    Colo -- 3-4 yrs ago; repeat due next year   DXA -- 2015 osteopenia; prescribed Fosamax but never started   Pap -- 11/2013 (one prior abnl pap and polyp removed)      FAMILY HISTORY:    Reviewed and no changes.    Mother -- died 859of CHF  Father -- died 872of decline after stroke  Sister -- died 458of NHL; another sister alive and well     SOCIAL HISTORY:    Reviewed and no changes    Lives with partner, [name]   Kids -- none   Retired [employer] developer   Tobacco -- only 4 yrs in early 20s, <1ppd  Alcohol -  1 glass wine/mth at most   Drugs - none      CURRENT MEDS:    I reviewed the medication list with the patient.    ALLERGIES:     No Known Allergies    REVIEW OF SYSTEMS (ROS):    A comprehensive 15-point ROS was performed and reviewed with the patient and is negative unless noted above.     EXAM:    Vitals 12/17/2018 06/10/2019 07/16/2019 12/17/2019 10/30/1476   Systolic 295 621 [phone]   Diastolic 79 80 80 73 77   Position Sitting Sitting Sitting Sitting Sitting   Site Left arm Right arm Left arm Left arm Left arm   Cuff Size - Regular Regular Regular Regular   Pulse 98 80 88 93 73   Resp 18 16 16 18 18    Temp 98.1 96.9 96.6 96.8 97.3   Temp Source 1 5 5 5 5    Weight (kg) 95.1 kg 91.808 kg - 85.73 kg 86.637 kg   Weight (lbs) 209 lb 10.5 oz 202 lb 6.4 oz - 189 lb 191 lb   Height (cm) 165.1 cm 164.3 cm 164.3 cm 165.1 cm 165.1 cm   Height (ft in) 5' 5"  5' 4.7" 5' 4.7" 5' 5"  5' 5"    BMI (kg/m2) 34.89 kg/m2 33.99 kg/m2 - 31.45 kg/m2 31.78 kg/m2   BSA (m2) 2.09 m2 2.05 m2 - 1.98 m2 1.99 m2   Fall Risk No fall risk identified No fall risk identified No fall risk identified No fall risk identified No fall risk identified   Some recent data might be hidden     GENERAL: The patient is well developed and well nourished, ambulatory, in no acute distress.  HEENT: normocephalic/atraumatic, anicteric sclera.  HEART: RRR.  CHEST: Clear to auscultation bilaterally.  AXILLAE: No palpable lesions  bilaterally.  BREASTS: Were examined bilaterally, no new palpable breast lesions, no nipple discharge bilaterally.   ABDOMEN: soft, non-tender, non-distended, normal bowel sounds, no rebound or guarding, no appreciable masses  EXTREMITIES: No edema of the lower extremities.  SKIN:  No rash or jaundice.  NEURO: AOx3, EOMI, no focal deficits, gait steady.  PSYCHIATRIC: Good insight adequate mood and affect.    LAB DATA:    Reviewed the lab work with patient:    Results for WESTON, FULCO (MRN [id number])      Ref. Range 06/14/2016 09:33 05/28/2017 09:30 06/03/2019 10:14 12/11/2019 11:02 06/15/2020 11:01   Sodium Latest Ref Range: 135 - 146 mmol/L 137 138 136  139   Potassium Latest Ref Range: 3.5 - 5.3 mmol/L 4.7 5.4 (H)   5.2   Potassium Latest Ref Range: 3.4 - 4.8 mmol/L   4.7     Chloride Latest Ref Range: 98 - 110 mmol/L 101 107 104  108   Carbon Dioxide Latest Ref Range: 20 - 32 mmol/L 25 26 20  22    BUN Latest Ref Range: 7 - 25 mg/dL 29 (H) 21 19  25    Creatinine Latest Ref Range: 0.60 - 0.93 mg/dL 1.53 (H) 1.24 (H) 1.10 (H)  1.17 (H)   eGFR non-Afr.American Latest Ref Range: > OR = 60 mL/min/1.80m 34 (L) 43 (L) 49 (L)  45 (L)   eGFR African American Latest Ref Range: > OR = 60 mL/min/1.712m39 (L) 50 (L) 57 (L)  52 (L)   Glucose Latest Ref Range: 65 - 99 mg/dL 226 (H) 103 (H) 156 (H)  98   Calcium Latest Ref Range: 8.6 - 10.4  mg/dL 8.8 9.5 8.7  8.7   Protein, Total Latest Ref Range: 6.4 - 8.4 g/dL   7.2     Total Protein Latest Ref Range: 6.1 - 8.1 g/dL 6.9 6.7   6.6   Alkaline Phos Latest Ref Range: 37 - 153 U/L 116 83 117  96   AST (SGOT) Latest Ref Range: 10 - 35 U/L 12 13 14  12    ALT (SGPT) Latest Ref Range: 6 - 29 U/L 8 9 9  6    Albumin Latest Ref Range: 3.6 - 5.1 g/dL 4.1 4.1 4.1  3.9   Bilirubin, Total Latest Ref Range: 0.2 - 1.2 mg/dL 0.6 1.0 0.9  1.3 (H)   BUN/Creatinine Ratio Latest Ref Range: 6 - 22 (calc) 19 17 17  21    [place]ora 27.29 Latest Ref Range: <38 U/mL 19 14 20  17    Cholesterol Latest Ref Range: <200  mg/dL  112      LDL-Cholesterol Latest Units: mg/dL (calc)  41      HDL Cholesterol Latest Ref Range: >50 mg/dL  50 (L)      Chol/HDLC Ratio Latest Ref Range: <5.0 (calc)  2.2      Non-HDL Cholesterol Latest Ref Range: <130 mg/dL (calc)  62      Ferritin Latest Ref Range: 16 - 288 ng/mL 11 (L) 16 (L)  25 16   Folate Latest Units: ng/mL  11.8      Globulin Latest Ref Range: 1.9 - 3.7 g/dL (calc) 2.8 2.6   2.7   Globulin Latest Ref Range: 2.2 - 4.0 g/dL (calc)   3.1     Albumin/Glob Ratio Latest Ref Range: 1.0 - 2.5 (calc) 1.5 1.6   1.4   Albumin/Globulin Ratio Latest Ref Range: 0.9 - 2.3 (calc)   1.3     Hgb A1C Latest Ref Range: <5.7 % of total Hgb  6.0 (H)      Iron Latest Ref Range: 45 - 160 mcg/dL 60 115  118 131   IIBC Latest Ref Range: 250 - 450 mcg/dL (calc) 422 393  349 380   Iron Saturation Latest Ref Range: 16 - 45 % (calc) 14 29  34 34   Triglycerides Latest Ref Range: <150 mg/dL  129      Vitamin B12 Latest Ref Range: 200 - 1,100 pg/mL 677 611      Vitamin D, 25-OH, Total Latest Ref Range: 30 - 100 ng/mL 26 (L) 40 48  35   WBC Latest Ref Range: 3.8 - 10.8 Thousand/uL 6.6 5.3 7.3  5.8   RBC Latest Ref Range: 3.80 - 5.10 Million/uL 3.71 (L) 3.57 (L) 4.08  3.67 (L)   HGB Latest Ref Range: 11.7 - 15.5 g/dL 10.5 (L) 11.5 (L) 12.5  11.1 (L)   HCT Latest Ref Range: 35.0 - 45.0 % 32.4 (L) 33.3 (L) 37.1  33.5 (L)   MCV Latest Ref Range: 80.0 - 100.0 fL 87.3 93.3 90.9  91.3   MCH Latest Ref Range: 27.0 - 33.0 pg 28.3 32.2 30.6  30.2   MCHC Latest Ref Range: 32.0 - 36.0 g/dL 32.4 34.5 33.7  33.1   RDW Latest Ref Range: 11.0 - 15.0 % 14.4 11.9 12.0  11.9   PLT Latest Ref Range: 140 - 400 Thousand/uL 201 179 217  193   MPV Latest Ref Range: 7.5 - 12.5 fL 9.4 9.5 10.1  10.0   SEGS Latest Units: % 55 54.7 63.6  54.8   Lymps Latest Units: %  32.9 36.7 28.4  37.1   Monocytes Latest Units: % 8.3 7.3 7.3  7.4   Eosinophils Latest Units: % 3.5 0.9 0.6  0.5   Basophils Latest Units: % 0.3 0.4 0.1  0.2   BANDS Latest Units: %   CANCELED CANCELED  CANCELED   Metamyelocytes Latest Units: %  CANCELED CANCELED  CANCELED   Myelocytes Latest Units: %  CANCELED CANCELED  CANCELED   Promyelocytes Latest Units: %  CANCELED CANCELED  CANCELED   Blasts Latest Units: %  CANCELED CANCELED  CANCELED   Reactive Lymphs Latest Ref Range: 0 - 10 %  CANCELED CANCELED  CANCELED   NRBC Latest Ref Range: 0 /100 WBC  CANCELED CANCELED  CANCELED   Abs Neutrophils Latest Ref Range: 1,500 - 7,800 cells/uL 3,630 2,899 4,643  3,178   Abs Lymphs Latest Ref Range: 850 - 3,900 cells/uL 2,171 1,945 2,073  2,152   Abs Monocytes Latest Ref Range: 200 - 950 cells/uL 548 387 533  429   Abs Eosinophils Latest Ref Range: 15 - 500 cells/uL 231 48 44  29   Abs Basophils Latest Ref Range: 0 - 200 cells/uL 20 21 7  12    Abs Band Neutrophils Latest Ref Range: 0 - 750 cells/uL  CANCELED CANCELED  CANCELED   Abs Metamyelocytes Latest Ref Range: 0 cells/uL  CANCELED CANCELED  CANCELED   Abs Myelocytes Latest Ref Range: 0 cells/uL  CANCELED CANCELED  CANCELED   Abs Promyelocytes Latest Ref Range: 0 cells/uL  CANCELED CANCELED  CANCELED   Abs Blasts Latest Ref Range: 0 cells/uL  CANCELED CANCELED  CANCELED   Abs NRBC Latest Ref Range: 0 cells/uL 0 0 0  0   Comments Unknown  CANCELED CANCELED  CANCELED     IMAGING DATA:    Reviewed the imaging with patient:    02-25-2020 MRI right knee showed IMPRESSION:     1. Attenuated posterior root of the lateral meniscus; query small tear.     2. Focal low-grade chondromalacia with questionable chondral delamination, measuring 0.6 cm in the weightbearing lateral femoral condyle.     3. Small Baker's cyst.     12-10-2019 DEXA showed osteoporosis is at T score -2.8.     07-01-2019 MRI of breasts showed     Result Impression   IMPRESSION:    1. Right breast: BI-RADS Category 4 - Suspicious. Recommend US-guided core needle biopsy lumpectomy scar at 11:00 location given nodular plateau enhancement seen at the superior aspect of the scar. Findings and  recommendations discussed with the patient on 07/01/19 at 12:30 pm.    2. Left breast: BI-RADS Category 1 - Negative.    OVERALL ASSESSMENT: BI-RADS Category 4.     05-26-2019 Mammogram showed   Result Impression     Area of distortion consistent with post surgical scarring in the left breast is benign. The findings and recommendations were discussed with the patient by Dr. Ricki Miller at the conclusion of this examination.    Suggest this patient return for her routine annual screening mammogram in 1 year.    ACR BI-RADS Category 2 - Benign Finding.     06-09-2018 Mammogram showed    Result Impression     Stable post lumpectomy scarring and radiation therapy change in the right breast is benign. Note that distortion from scarring can obscure underlying lesions.    Suggest this patient return for her routine annual screening mammogram in 1 year.    ACR BI-RADS Category 2 - Benign  Finding.     June 03, 2017 bone density shows osteopenia T-score of -2.4 in the left femoral neck.    06-03-2017 Mammogram showed Stable post lumpectomy scarring and radiation therapy change in the right breast is benign.    Suggest this patient return for her routine annual screening mammogram in 1 year.    ACR BI-RADS Category 2 -Benign Finding.    05/24/2016 bilateral diagnostic 2D and 3D mammogram shows stable post lumpectomy changes in the right breast are benign.  December 27, 2015 echocardiogram shows a normal ejection fraction.  05-24-2015 bilateral diagnostic mammogram and bilateral breast ultrasound showed new post lumpectomy scar in the right breast is benign, stable punctate calcifications in the area areolar region of the right breast are benign, stable punctate calcifications in the left breast at 6 o'clock are benign with no evidence of malignancy.  11/22/2014 bilateral 2D and 3D mammogram shows new punctate calcifications in the periareolar region of the right breast are probably benign in mammographic follow-up in 6 months is  recommended, new post lumpectomy scar in the right breast is benign. New punctate calcifications in the left breast at 6 o'clock up probably benign.  11/22/2014 bone density shows osteoporosis with a T-score of-2.6 in the right femoral neck.  August 31, 2014 echocardiogram shows left ventricular diastolic dysfunction, normal left ventricular size and systolic function.    PATHOLOGY:    I reviewed the report with her:    07/07/2019 RIGHT BREAST, 11:00 AT 7 CM, CORE BIOPSY:  Fat necrosis and scar, consistent with prior procedure site changes.  No carcinoma identified.    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  09/27/2014 left thyroid nodule atypical follicular lesion of undetermined significance, a right thyroid nodule fine-needle aspiration negative for malignancy.    IMPRESSION/PLAN:    Problem # 1: BREAST CANCER      Initial clinical stage IIA pT2N0 right IDC s/p bx 01/14/14 (ER/PR negative, Her-2 positive by copy number = 6.6, Ki-67=50%). MRI breasts 01/29/14 showed lesion to be 2.2 x 1.6 x 2.0 cm. Staging PET/CT negative for distant mets. Neoadjuvant chemotherapy with Her-2 targeted therapy was suggested, since tumor size > 2cm, so recommended TCH + P (Taxotere, Carboplatin, Herceptin, Perjeta) q3wks x 6 cycles. Systemic therapies started 02/17/14 but pt developed severe diarrhea causing dehydration and electrolyte abnormalities requiring daily hydration and electrolyte replacements so changed regimen to weekly Taxol x 12 weeks with Herceptin/Perjeta q3wks and tolerated only slightly better. Right breast mass resolved on physical exam. Pt was becoming weaker due to ongoing diarrhea on Taxol so discontinued after 04/28/14 dose with plan for repeat breast imaging followed by surgery if response was good. Breast imaging 04/28/14 (mammo, u/s) and MRI 04/30/14 showed  minimal residual disease so Taxol was discontinued after 04/28/14 dose (week #8) and Herceptin/Perjeta continued. Right lumpectomy + SLNB 05/28/14 showed 6 mm residual disease, grade 2, SBR=7/9 (lower mitotic rate than bx), 0/1 LN; repeat ER/PR negative. Since only 5 doses of Perjeta were given pre-operatively and there was residual disease in surgical specimen, she was given the final 6th dose of Perjeta 06/02/14 with usual q3wk dose of Herceptin. Single-agent Herceptin continued q3wk and completed a 1-yr course in 12-2014 and since then no therapy. The radiation therapy was completed.Clinically no new findings in the most recent was reviewed a repeat breast MRI was ordered.  The repeat breast  MRI showed a BI-RADS category 4 in the right breast for which she under went a biopsy and it confirmed only fat necrosis and no new concerns on today's exam and repeat mammogram was ordered.  No further intervention is necessary will continue to monitor.  She was encouraged to continue breast self-examination.    Problem # 2: ACUTE DVT OF UPPER EXTREMITY      LUE acute DVT dx'ed by u/s 02/10/14 due to sudden onset of left arm swelling, completed anticoagulation and no evidence of recurrent thrombosis. May consider to remove PORT was concerned about chance to develop DVT but advised that removing the PORT does not usually cause a DVT.    Problem # 3: ABNORMAL ECHO      Baseline ECHO 02/10/14 with normal EF of 65-70% but with mild diastolic dysfunction. I advised her to continue f/u with Dr. Reubin Milan for monitoring of cardiac function and recommended again a repeat ECHO and will retrieve the report from 2019.    Problem # 4: THYROID NODULE       She had a total thyroidectomy and no malignancy found. Follow-up with Endocrinology or now PCP.    PROBLEM # 5 OSTEOPOROSIS    Repeat bone density was reviewed now she has osteopenia and remains on Fosamax and also vitamin-D and calcium. Repeat DEXA reviewed and now has osteoporosis  and discussed Prolia and gave educational material and needs to obtain dental clearance.    PROBLEM # 6 MELANOMA    No new complaints and followed by dermatology. Discussed signs and symptoms suggestive of recurrent disease but so far only SCCA and no new melanoma.     Cancer Patients and Survivors and COVID19    We reviewed the preliminary guidelines published 11-13-2019: [email or web address]    Adapted from https://www.thelancet.com/journals/lanonc/article/[email or web address])[email or web address]/fulltext    Our results show that patients with cancer with different tumour types have differing susceptibility to SARS-CoV-2 and differing COVID-19 disease phenotypes, with notable increased SARS-CoV-2 hospital presentations in patients with haematological cancers. We generated individualised risk tables for patients with cancer, displaying the effect of age and sex and tumour subtype.  There are variations and challenges in determining if COVID-19 was the direct cause of death for a patient, or if death was caused by a terminal event in a patient who was approaching the end of their cancer care. We analysed the all-cause case-fatality rate in patients with COVID-19 and cancer, which was a strength of this study, in addition to the comparison with a general cancer population control group.  Patients with haematological malignancies (leukaemia, lymphoma, and myeloma) are overrepresented, which is perhaps suggestive of an a priori increased susceptibility to viral infection. Patients with extranodal natural killer/T-cell lymphoma (ICD-10 code C86), Ivonne Andrew (C88), and unspecified neoplasm of lymphoid, haematopoietic and related tissue (C96) were greatly overrepresented. The reasons for this are unclear because of the small number of patients involved and stochastic effects.    Large COVID-19 cancer cohorts of predominantly solid organ tumours have shown no  significant excess mortality risk from recent chemotherapy. In this study, we identified in multivariable analyses that risk appears to be heightened by recent (within 4 weeks) or current chemotherapy in patients with haematological malignancies, similar to findings in other cohorts.  There could be several reasons for these observations. The immunological disruption observed in patients with leukaemia and the use of intensely myelosuppressive treatment regimens might result in a combination of risks, in terms of the likelihood of initial SARS-CoV-2 infection, its ability to gain  a foothold in the host, and in terms of the downstream disease course and likelihood of severe consequences, such as cytokine storm and multi-organ failure. Therefore, further validation of these findings and more in-depth research into the possible causes is important.    Adapted from https://www.oncnursingnews.com/web-exclusives/cancer-and-covid19-death-whats-the-correlation 08-07-2019    Cancer is independently associated with mortality in patients admitted to the hospital due to coronavirus disease 2019 (COVID-19) infection, according to data from The Clinical Characterization Protocol [place] (CCP-[place]) study, organized by the International Severe Acute Respiratory and emerging Infections Consortium ([place] (CO-CIN).    Insights from the [place] study [phone]), which comprises [place]'s largest prospective dataset, included [phone] patients in the [place]) with complete outcomes, 10.5% of whom had cancer, 2.5% of whom were receiving active treatment for their cancer, and 8.0% of whom had a history of cancer upon data extraction on June 08, 2019.Data made available at the virtual 2020 ESMO Congress showed that, at the time of the presentation, 44.3%, 42.3%, and 29.5% of patients with a history of cancer, patients receiving anti-cancer therapy, and patients without cancer or a history of  cancer had died, respectively.    In line with these findings, additional results demonstrated a "significantly higher" 30-day mortality rate among patients with cancer compared with patients who did not have cancer, according to Cornelious Bryant, BSc, MB, BS, PhD, Alphonzo [place], who presented the findings (40.5% vs 28.5%; unadjusted HR, 1.62; 95% CI, 1.56-1.68; P <.001).    When stratified by age, 30-day mortality was consistently higher in all age-specific subgroups of patients with cancer versus patients without cancer. Among the 4 age-defined subsets studied-less than 50 years; 6 to 53 years; 48 to 78 years; and older than 73 years-30-day mortality was highest in patients with cancer in this last group (46.8% vs 42.5%, no cancer).    Adapted from the CDC "Symptoms and Caring for Yourself at Home"    Discussed signs and symptoms of COVID19 and advised to immediately contact us for any new problems. Pros and cons of vaccination were reviewed and advised to proceed to the booster once available.    Reported illnesses have ranged from mild symptoms to severe illness and death for confirmed coronavirus disease 2019 (COVID-19) cases.    These symptoms may appear 2-14 days after exposure (based on the incubation period of MERS-CoV viruses).    Coronavirus symptoms include, but are not limited to:    Marland Kitchen Fever or chills  . Cough  . Shortness of breath or difficulty breathing  . Fatigue  . Muscle or body aches  . Headache  . New loss of taste or smell  . Sore throat  . Congestion or runny nose  . Nausea or vomiting  . Diarrhea    If you have possible or confirmed COVID-19:  . Stay home from work, school, and away from other public places.   . If you must go out, avoid using any kind of public transportation, ridesharing, or taxis.   . Monitor your symptoms carefully.   . If your symptoms get worse, call your healthcare provider immediately.   . Get rest and stay hydrated. If you have a medical appointment, call the healthcare  provider ahead of time and tell them that you have or may have COVID-19.   . For medical emergencies, call 911 and notify the dispatch personnel that you have or may have COVID-19.   . Cover your cough and sneezes.   Wendee Copp your hands often  with soap and water for at least 20 seconds or clean your hands with an alcohol-based hand sanitizer that contains at least 60% alcohol.   . As much as possible, stay in a specific room and away from other people in your home.   . Also, you should use a separate bathroom, if available.   . If you need to be around other people in or outside of the home, wear a facemask.   Marland Kitchen Avoid sharing personal items with other people in your household, like dishes, towels, and bedding.   . Clean all surfaces that are touched often, like counters, tabletops, and doorknobs. Use household cleaning sprays or wipes according to the label instructions.    If you develop emergency warning signs for COVID-19 get medical attention immediately. Emergency warning signs include*:  . Trouble breathing   . Persistent pain or pressure in the chest   . New confusion or inability to arouse   . Bluish lips or face  *This list is not all inclusive. Please consult your medical provider for any other symptoms that are severe or concerning.    There is no specific antiviral treatment recommended for COVID-19. People with COVID-19 should receive supportive care to help relieve symptoms. For severe cases, treatment should include care to support vital organ functions.    Preliminary evidence suggests that COVID-19 not only detrimentally affects respiratory organs, but also can result in "heart inflammation, acute kidney disease, neurological malfunction, blood clots, intestinal damage and liver problems."    People who think they may have been exposed to COVID-19 should contact their healthcare provider immediately.    PLAN:    1. Completed single-agent Herceptin q3wks to complete 26yrtotal course (06/23/14 thru  01/19/15).  Repeat mammogram ordered.   2. Anemia remains mild and creatinine not worsened. For now continue observation.   3. ECHO to be repeated as per Dr. RReubin Milanof cardiology.    4. Thyroidectomy done due to atypical left thyroid nodule and no malignancy, follow-up with Endocrinology.  5. Renal insufficiency and diabetes may contribute to anemia but the anemia is not worsened for now.  6. History of melanoma and she is followed by Dermatology.    Orders Placed This Encounter   Procedures   . Diagnostic Mammogram With Digital Breast Tomosynthesis - Bilateral   .  27.29, BLOOD   . CBC w/ Diff Lavender   . Comprehensive Metabolic Panel   . Vitamin D, 25-OH Total Yellow serum separator tube     Thank you very much for allowing our continued participation in the care of this patient.     Medical decision-making was highly complex  and more than 50% of this _40_ min visit was spent in educating the patient about their condition, discussing compliance issues, counseling including answering all of the patients questions and coordination of care. This included review of relevant laboratory, radiology and other diagnostic tests, explaining medical management choices and the patient verbalized understanding.                                         RTO: 4-6 months    I certify that I have reviewed the documentation contained in this clinical record and that it is accurately recorded. This document contains private and confidential health information protected by state and federal law and any release of this information requires the written prior authorization of  the above mentioned patient.    Please note this report was dictated with the use of voice recognition software.  It may contain inadvertent spelling or grammatical errors which were not detected during the editing process.       Take a virtual tour of our facility:  [email or web address]          --------------------------------------  [name], MD, MBA

## 2020-06-22 NOTE — Patient Instructions (Signed)
Orders Placed This Encounter   Procedures    Diagnostic Mammogram With Digital Breast Tomosynthesis - Bilateral    Smoot 27.29, BLOOD    CBC w/ Diff Lavender    Comprehensive Metabolic Panel    Vitamin D, 25-OH Total Yellow serum separator tube

## 2020-06-29 ENCOUNTER — Other Ambulatory Visit: Payer: Self-pay | Admitting: Hematology & Oncology

## 2020-06-29 DIAGNOSIS — G629 Polyneuropathy, unspecified: Secondary | ICD-10-CM

## 2020-07-01 ENCOUNTER — Other Ambulatory Visit: Payer: Self-pay | Admitting: Hematology & Oncology

## 2020-07-01 DIAGNOSIS — M8580 Other specified disorders of bone density and structure, unspecified site: Secondary | ICD-10-CM

## 2020-07-04 MED ORDER — ALENDRONATE SODIUM 70 MG OR TABS
70.0000 mg | ORAL_TABLET | ORAL | 0 refills | Status: DC
Start: 2020-07-04 — End: 2020-07-29

## 2020-07-04 MED ORDER — GABAPENTIN 300 MG OR CAPS
300.0000 mg | ORAL_CAPSULE | Freq: Three times a day (TID) | ORAL | 0 refills | Status: DC
Start: 2020-07-04 — End: 2020-09-20

## 2020-07-28 ENCOUNTER — Other Ambulatory Visit: Payer: Self-pay | Admitting: Hematology & Oncology

## 2020-07-28 DIAGNOSIS — M8580 Other specified disorders of bone density and structure, unspecified site: Secondary | ICD-10-CM

## 2020-07-28 NOTE — Telephone Encounter (Signed)
Fosamax 70 mg refill request

## 2020-07-29 ENCOUNTER — Other Ambulatory Visit: Payer: Self-pay | Admitting: Hematology & Oncology

## 2020-07-29 DIAGNOSIS — M8580 Other specified disorders of bone density and structure, unspecified site: Secondary | ICD-10-CM

## 2020-07-29 MED ORDER — ALENDRONATE SODIUM 70 MG OR TABS
70.0000 mg | ORAL_TABLET | ORAL | 0 refills | Status: DC
Start: 2020-07-29 — End: 2020-08-29

## 2020-08-27 ENCOUNTER — Other Ambulatory Visit: Payer: Self-pay | Admitting: Hematology & Oncology

## 2020-08-27 DIAGNOSIS — M8580 Other specified disorders of bone density and structure, unspecified site: Secondary | ICD-10-CM

## 2020-08-29 MED ORDER — ALENDRONATE SODIUM 70 MG OR TABS
70.0000 mg | ORAL_TABLET | ORAL | 0 refills | Status: DC
Start: 2020-08-29 — End: 2020-09-19

## 2020-09-16 ENCOUNTER — Other Ambulatory Visit: Payer: Self-pay | Admitting: Hematology & Oncology

## 2020-09-16 DIAGNOSIS — G629 Polyneuropathy, unspecified: Secondary | ICD-10-CM

## 2020-09-19 ENCOUNTER — Other Ambulatory Visit: Payer: Self-pay

## 2020-09-19 DIAGNOSIS — M8580 Other specified disorders of bone density and structure, unspecified site: Secondary | ICD-10-CM

## 2020-09-20 MED ORDER — ALENDRONATE SODIUM 70 MG OR TABS
70.0000 mg | ORAL_TABLET | ORAL | 0 refills | Status: DC
Start: 2020-09-20 — End: 2020-09-27

## 2020-09-20 MED ORDER — GABAPENTIN 300 MG OR CAPS
300.0000 mg | ORAL_CAPSULE | Freq: Three times a day (TID) | ORAL | 0 refills | Status: DC
Start: 2020-09-20 — End: 2020-11-08

## 2020-09-27 ENCOUNTER — Other Ambulatory Visit: Payer: Self-pay | Admitting: Hematology & Oncology

## 2020-09-27 DIAGNOSIS — M8580 Other specified disorders of bone density and structure, unspecified site: Secondary | ICD-10-CM

## 2020-09-27 MED ORDER — ALENDRONATE SODIUM 70 MG OR TABS
70.0000 mg | ORAL_TABLET | ORAL | 0 refills | Status: DC
Start: 2020-09-27 — End: 2020-10-28

## 2020-10-28 ENCOUNTER — Other Ambulatory Visit: Payer: Self-pay | Admitting: Hematology & Oncology

## 2020-10-28 DIAGNOSIS — M8580 Other specified disorders of bone density and structure, unspecified site: Secondary | ICD-10-CM

## 2020-10-28 MED ORDER — ALENDRONATE SODIUM 70 MG OR TABS
70.0000 mg | ORAL_TABLET | ORAL | 0 refills | Status: DC
Start: 2020-10-28 — End: 2020-12-18

## 2020-11-05 ENCOUNTER — Other Ambulatory Visit: Payer: Self-pay | Admitting: Hematology & Oncology

## 2020-11-05 DIAGNOSIS — G629 Polyneuropathy, unspecified: Secondary | ICD-10-CM

## 2020-11-08 MED ORDER — GABAPENTIN 300 MG OR CAPS
300.00 mg | ORAL_CAPSULE | Freq: Three times a day (TID) | ORAL | 0 refills | Status: DC
Start: 2020-11-08 — End: 2020-12-08

## 2020-12-07 ENCOUNTER — Other Ambulatory Visit: Payer: Self-pay | Admitting: Hematology & Oncology

## 2020-12-07 DIAGNOSIS — G629 Polyneuropathy, unspecified: Secondary | ICD-10-CM

## 2020-12-08 MED ORDER — GABAPENTIN 300 MG OR CAPS
300.00 mg | ORAL_CAPSULE | Freq: Three times a day (TID) | ORAL | 0 refills | Status: DC
Start: 2020-12-08 — End: 2021-02-20

## 2020-12-15 LAB — CBC WITH DIFF, BLOOD
Abs Basophils: 11 cells/uL (ref 0–200)
Abs Eosinophils: 22 cells/uL (ref 15–500)
Abs Lymphs: 1686 cells/uL (ref 850–3900)
Abs Monocytes: 431 cells/uL (ref 200–950)
Abs Neutrophils: 3450 cells/uL (ref 1500–7800)
Basophils: 0.2 %
Eosinophils: 0.4 %
HCT: 33.8 % — ABNORMAL LOW (ref 35.0–45.0)
HGB: 11.1 g/dL — ABNORMAL LOW (ref 11.7–15.5)
Lymps: 30.1 %
MCH: 29.9 pg (ref 27.0–33.0)
MCHC: 32.8 g/dL (ref 32.0–36.0)
MCV: 91.1 fL (ref 80.0–100.0)
MPV: 10.3 fL (ref 7.5–12.5)
Monocytes: 7.7 %
PLT: 234 10*3/uL (ref 140–400)
RBC: 3.71 10*6/uL — ABNORMAL LOW (ref 3.80–5.10)
RDW: 12 % (ref 11.0–15.0)
SEGS: 61.6 %
WBC: 5.6 10*3/uL (ref 3.8–10.8)

## 2020-12-15 LAB — COMPREHENSIVE METABOLIC PANEL, BLOOD
ALT (SGPT): 7 U/L (ref 6–29)
AST (SGOT): 12 U/L (ref 10–35)
Albumin/Glob Ratio: 1.8 (calc) (ref 1.0–2.5)
Albumin: 4.2 g/dL (ref 3.6–5.1)
Alkaline Phos: 96 U/L (ref 37–153)
BUN/Creatinine Ratio: 21 (calc) (ref 6–22)
BUN: 25 mg/dL (ref 7–25)
Bilirubin, Total: 1.3 mg/dL — ABNORMAL HIGH (ref 0.2–1.2)
Calcium: 8.9 mg/dL (ref 8.6–10.4)
Carbon Dioxide: 19 mmol/L — ABNORMAL LOW (ref 20–32)
Chloride: 105 mmol/L (ref 98–110)
Creatinine: 1.19 mg/dL — ABNORMAL HIGH (ref 0.60–0.93)
Globulin: 2.4 g/dL (calc) (ref 1.9–3.7)
Glucose: 126 mg/dL — ABNORMAL HIGH (ref 65–99)
Potassium: 5.2 mmol/L (ref 3.5–5.3)
Sodium: 134 mmol/L — ABNORMAL LOW (ref 135–146)
Total Protein: 6.6 g/dL (ref 6.1–8.1)
eGFR African American: 51 mL/min/{1.73_m2} — ABNORMAL LOW (ref >=60)
eGFR non-Afr.American: 44 mL/min/{1.73_m2} — ABNORMAL LOW (ref >=60)

## 2020-12-15 LAB — ~~LOC~~ 27.29, BLOOD: ~~LOC~~ 27.29: 15 U/mL (ref <38)

## 2020-12-15 LAB — VITAMIN D, 25-OH TOTAL: Vitamin D, 25-OH, Total: 37 ng/mL (ref 30–100)

## 2020-12-18 ENCOUNTER — Other Ambulatory Visit: Payer: Self-pay | Admitting: Hematology & Oncology

## 2020-12-18 DIAGNOSIS — M8580 Other specified disorders of bone density and structure, unspecified site: Secondary | ICD-10-CM

## 2020-12-18 MED ORDER — ALENDRONATE SODIUM 70 MG OR TABS
70.00 mg | ORAL_TABLET | ORAL | 0 refills | Status: DC
Start: 2020-12-18 — End: 2021-01-30

## 2020-12-20 ENCOUNTER — Ambulatory Visit (INDEPENDENT_AMBULATORY_CARE_PROVIDER_SITE_OTHER): Payer: Medicare Other | Admitting: Hematology & Oncology

## 2020-12-20 ENCOUNTER — Encounter: Payer: Self-pay | Admitting: Hematology & Oncology

## 2020-12-20 VITALS — BP 126/75 | HR 84 | Temp 96.6°F | Resp 18 | Ht 65.0 in | Wt 195.0 lb

## 2020-12-20 DIAGNOSIS — M810 Age-related osteoporosis without current pathological fracture: Secondary | ICD-10-CM

## 2020-12-20 DIAGNOSIS — C50411 Malignant neoplasm of upper-outer quadrant of right female breast: Secondary | ICD-10-CM

## 2020-12-20 DIAGNOSIS — C4361 Malignant melanoma of right upper limb, including shoulder: Secondary | ICD-10-CM

## 2020-12-20 DIAGNOSIS — Z171 Estrogen receptor negative status [ER-]: Secondary | ICD-10-CM

## 2020-12-20 NOTE — Progress Notes (Signed)
Kayla Joseph A. Kayla Singh, MD, MBA  Clinical Professor  Division of Hematology/Oncology, Department of Medicine  Medical Director, Kayla  Joseph., [address], [place] [address]  Tel: [phone]  Fax: [phone]                                        HEMATOLOGY ONCOLOGY FOLLOW UP NOTE     Date of Service:   12/20/2020       Kayla Joseph, DOB 03-Apr-1946    REFERRING MD: Kayla Joseph  Radiation oncology: Kayla Joseph  PCP: Kayla Joseph  GYN: Kayla Joseph   Cardiology: Kayla Joseph    CHIEF COMPLAINT: Routine follow-up visit for right breast cancer and melanoma    HISTORY OF PRESENT ILLNESS:    75 y/o Caucasian woman with mammographically detected clinical stage IIA T2N0 right breast invasive ductal carcinoma s/p bx 01/14/14 (SBR 9/9, ER/PR negative, Ki-67=50%, Her-2/neu FISH ratio=1.14 but copy number=6.6 so positive). MRI breast 01/29/14 showed mass to be 2.2 x 1.6 x 2.0 cm. Staging PET/CT 01/27/14 was negative for distant mets; showed only known right breast tumor and incidental left thyroid nodule. Planned neoadjuvant systemic therapy: TCH + P x 6 cycles followed by surgery and radiation therapy. Cycle #1 TCH+P given 02/17/14. Pt developed severe diarrhea and dehydration causing electrolyte derangements requiring daily replacement therapies and hydration. Therefore, chemotherapy was changed to weekly Taxol for 12 weeks and Herceptin plus Perjeta every 3 weeks was continued. Taxol started 03/10/14 and completed only 8 weeks on 04/28/14 due to cumulative severe side effects and MRI breast 04/30/14 showing only 29m of residual enhancement. Herceptin/Perjeta continued q3wks for 6 total cycles. Right lumpectomy + SLNB done 05/28/14 (6 mm residual IDC, grade 2, SBR 7/9, no LI, 0/1 LN; ypT1bN0; residual tumor had lower a mitotic score compared to initial biopsy specimen likely due to chemotherapy  effect; repeat ER and PR on residual tumor was again negative for both). Due to residual disease although minimal, pt received final dose (#6) of Perjeta 06/02/14. Herceptin single-agent q3wks started 06/23/14.     July 14, 2014 (Herceptin week #22)     Started single-agent Herceptin 06/23/14 and tolerated it well without side effects. Hair is starting to grow. Neuropathy in the fingers and bottoms of the feet continue but improve when she takes gabapentin 300 mg 3 times a day. She does not want to increase the dose since it is controlling her neuropathy and she does not want to increase fatigue due to medication side effect. [place] it was improved with more solid form [place] The only difference during that trip was that her friends were cooking all of her meals for her so a change in diet seems to have improved the diarrhea. Since her return, her diarrhea has resumed unchanged. She has about 6 watery episodes of diarrhea per day and has had 3 episodes thus far this morning. She continues Percocet as needed but also has a prior prescription for more tincture of opium which she will change back to since postop pain has resolved. She has not yet done repeat stool studies since she was on her trip but will do so soon with prior order. She has not seen the gastroenterologist  yet but will ask her primary care doctor for a referral to see one soon. Dysuria symptoms recurred this past weekend. Bactrim worked well in the past after the 1st dose. She saw Dr Kayla Joseph 06/25/14 and will start xrt next week on 07/21/14. She receives hydration every 3 weeks with Herceptin but requests more frequent visits since her diarrhea is ongoing. Labs (CBC, CMP) 06/02/14 sig for H/H=10.6/32.9; gluc=221.   The patient completed the Herceptin every 3 weeks for total of 1 year of adjuvant therapy in 12-2014 and remains off therapy.     INTERVAL  HISTORY:    The patient went for a repeat breast MRI and had a BI-RADS category 4 finding in the right breast and underwent a right breast biopsy and it was negative. She reports no Kayla breast related issues. She is due for a repeat mammogram and did not manage to get it done yet.  She remains on vitamin-D and calcium and weekly Alendronate for osteopenia and is wondering if she should continue the Alendronate and tolerating it well and no Kayla reported dental issues. She is followed by dermatology for melanoma and reports no Kayla melanoma lesions.. She denies any bleeding. She is worried about COVID-19 but denies any fevers or chills or cough or shortness of breath and she had the vaccine and tolerated it well and completed the 3rd booster.    11-20-2019 1st vaccine Kayla Joseph at [facility]  12-11-2019 2nd vaccine Kayla Joseph at [place] HISTORY:     Reviewed and no changes.    Stage IIA T2N0 right breast IDC --> ypT1bN0   LUE DVT dx'ed by U/S 02/10/14 -- Lovenox started same day; resolved on 05/18/14 U/S; completed Lovenox 06/07/14   Stage I pT1aN0 melanoma of upper back s/p excision  DM II  Hyperlipidemia  Hypertension  BCC skin  Thyroid nodules bilaterally   She had a colonoscopy in 2018 and to be repeated in 2023.       PAST SURGICAL HISTORY:     Reviewed and no changes.    05/28/14 Right lumpectomy + SLNB   02/05/14 L. chest port placement ([facility])  07/04/11 Wide excision upper back melanoma + R. supraclavicular SLNB (lentigo melanoma and melanoma in situ, 0.75 mm, no residual invasive melanoma, 0/1 LN)   2013 Facelift  2012 Gastric bypass  1999 Cholecystectomy  1995 Left foot fracture repair with pins  Age 23 Tonsillectomy     PAST OB/GYN HISTORY:     Reviewed and no changes.     G0P0. Menarche at 14. OCP x 15 yrs; last age 75s. HRT late 75s for <61mhs; stopped after [facility] study results reported. LMP age 75      HEALTH  MAINTENANCE:    Colo -- 3-4 yrs ago; repeat due next year   DXA -- 2015 osteopenia; prescribed Fosamax but never started   Pap -- 11/2013 (one prior abnl pap and polyp removed)      FAMILY HISTORY:    Reviewed and no changes.    Mother -- died 835of CHF  Father -- died 882of decline after stroke  Sister -- died 474of NHL; another sister alive and well     SOCIAL HISTORY:                Reviewed and no changes    Lives with partner, [name]   Kids -- none   Retired [employer] developer   Tobacco --  only 4 yrs in early 60s, <1ppd  Alcohol - 1 glass wine/mth at most   Drugs - none      CURRENT MEDS:    I reviewed the medication list with the patient.    ALLERGIES:     No Known Allergies    REVIEW OF SYSTEMS (ROS):    A comprehensive 15-point ROS was performed and reviewed with the patient and is negative unless noted above.     EXAM:    Vitals 06/10/2019 07/16/2019 12/17/2019 06/22/2020 12/20/2020   Taken/Reported - - - - In Clinic   Systolic 195 093 267 124 580   Diastolic 80 80 73 77 75   Position Sitting Sitting Sitting Sitting Sitting   Site Right arm Left arm Left arm Left arm Left arm   Cuff Size Regular Regular Regular Regular Regular   Pulse 80 88 93 73 84   Resp 16 16 18 18 18    Temp 96.9 96.6 96.8 97.3 96.6   Temp Source 5 5 5 5 5    Weight (kg) 91.808 kg - 85.73 kg 86.637 kg 88.451 kg   Weight (lbs) 202 lb 6.4 oz - 189 lb 191 lb 195 lb   Height (cm) 164.3 cm 164.3 cm 165.1 cm 165.1 cm 165.1 cm   Height (ft in) 5' 4.7" 5' 4.7" 5' 5"  5' 5"  5' 5"    BMI (kg/m2) 33.99 kg/m2 - 31.45 kg/m2 31.78 kg/m2 32.45 kg/m2   BSA (m2) 2.05 m2 - 1.98 m2 1.99 m2 2.01 m2   Fall Risk No fall risk identified No fall risk identified No fall risk identified No fall risk identified No fall risk identified   Some recent data might be hidden     GENERAL: The patient is well developed and well nourished, ambulatory, in no acute distress.  HEENT: normocephalic/atraumatic, anicteric sclera.  HEART: RRR. No murmurs.  CHEST: Clear  to auscultation bilaterally.  AXILLAE: No palpable lesions bilaterally.  BREASTS: Were examined bilaterally, no Kayla palpable breast lesions, no nipple discharge bilaterally.   ABDOMEN: soft, non-tender, non-distended, normal bowel sounds, no rebound or guarding, no appreciable masses  EXTREMITIES: No edema of the lower extremities.  SKIN:  No rash or jaundice.  NEURO: AOx3, EOMI, no focal deficits, gait steady.  PSYCHIATRIC: Good insight adequate mood and affect.    LAB DATA:    Reviewed the lab work with patient:    Results for LILLIAS, DIFRANCESCO (MRN [id number])     Ref. Range 05/28/2017 09:30 06/03/2019 10:14 12/11/2019 11:02 06/15/2020 11:01 12/14/2020 09:25   Sodium Latest Ref Range: 135 - 146 mmol/L 138 136  139 134 (L)   Potassium Latest Ref Range: 3.5 - 5.3 mmol/L 5.4 (H)   5.2 5.2   Potassium Latest Ref Range: 3.4 - 4.8 mmol/L  4.7      Chloride Latest Ref Range: 98 - 110 mmol/L 107 104  108 105   Carbon Dioxide Latest Ref Range: 20 - 32 mmol/L 26 20  22 19  (L)   BUN Latest Ref Range: 7 - 25 mg/dL 21 19  25 25    Creatinine Latest Ref Range: 0.60 - 0.93 mg/dL 1.24 (H) 1.10 (H)  1.17 (H) 1.19 (H)   eGFR non-Afr.American Latest Ref Range: > OR = 60 mL/min/1.60m 43 (L) 49 (L)  45 (L) 44 (L)   eGFR African American Latest Ref Range: > OR = 60 mL/min/1.768m50 (L) 57 (L)  52 (L) 51 (L)   Glucose Latest Ref Range: 65 -  99 mg/dL 103 (H) 156 (H)  98 126 (H)   Calcium Latest Ref Range: 8.6 - 10.4 mg/dL 9.5 8.7  8.7 8.9   Protein, Total Latest Ref Range: 6.4 - 8.4 g/dL  7.2      Total Protein Latest Ref Range: 6.1 - 8.1 g/dL 6.7   6.6 6.6   Alkaline Phos Latest Ref Range: 37 - 153 U/L 83 117  96 96   AST (SGOT) Latest Ref Range: 10 - 35 U/L 13 14  12 12    ALT (SGPT) Latest Ref Range: 6 - 29 U/L 9 9  6 7    Albumin Latest Ref Range: 3.6 - 5.1 g/dL 4.1 4.1  3.9 4.2   Bilirubin, Total Latest Ref Range: 0.2 - 1.2 mg/dL 1.0 0.9  1.3 (H) 1.3 (H)   BUN/Creatinine Ratio Latest Ref Range: 6 - 22 (calc) 17 17  21 21    [place] 27.29 Latest Ref  Range: <38 U/mL 14 20  17 15    Cholesterol Latest Ref Range: <200 mg/dL 112       LDL-Cholesterol Latest Units: mg/dL (calc) 41       HDL Cholesterol Latest Ref Range: >50 mg/dL 50 (L)       Chol/HDLC Ratio Latest Ref Range: <5.0 (calc) 2.2       Non-HDL Cholesterol Latest Ref Range: <130 mg/dL (calc) 62       Ferritin Latest Ref Range: 16 - 288 ng/mL 16 (L)  25 16    Folate Latest Units: ng/mL 11.8       Globulin Latest Ref Range: 1.9 - 3.7 g/dL (calc) 2.6   2.7 2.4   Globulin Latest Ref Range: 2.2 - 4.0 g/dL (calc)  3.1      Albumin/Glob Ratio Latest Ref Range: 1.0 - 2.5 (calc) 1.6   1.4 1.8   Albumin/Globulin Ratio Latest Ref Range: 0.9 - 2.3 (calc)  1.3      Hgb A1C Latest Ref Range: <5.7 % of total Hgb 6.0 (H)       Iron Latest Ref Range: 45 - 160 mcg/dL 115  118 131    IIBC Latest Ref Range: 250 - 450 mcg/dL (calc) 393  349 380    Iron Saturation Latest Ref Range: 16 - 45 % (calc) 29  34 34    Triglycerides Latest Ref Range: <150 mg/dL 129       Vitamin B12 Latest Ref Range: 200 - 1,100 pg/mL 611       Vitamin D, 25-OH, Total Latest Ref Range: 30 - 100 ng/mL 40 48  35 37   WBC Latest Ref Range: 3.8 - 10.8 Thousand/uL 5.3 7.3  5.8 5.6   RBC Latest Ref Range: 3.80 - 5.10 Million/uL 3.57 (L) 4.08  3.67 (L) 3.71 (L)   HGB Latest Ref Range: 11.7 - 15.5 g/dL 11.5 (L) 12.5  11.1 (L) 11.1 (L)   HCT Latest Ref Range: 35.0 - 45.0 % 33.3 (L) 37.1  33.5 (L) 33.8 (L)   MCV Latest Ref Range: 80.0 - 100.0 fL 93.3 90.9  91.3 91.1   MCH Latest Ref Range: 27.0 - 33.0 pg 32.2 30.6  30.2 29.9   MCHC Latest Ref Range: 32.0 - 36.0 g/dL 34.5 33.7  33.1 32.8   RDW Latest Ref Range: 11.0 - 15.0 % 11.9 12.0  11.9 12.0   PLT Latest Ref Range: 140 - 400 Thousand/uL 179 217  193 234   MPV Latest Ref Range: 7.5 - 12.5 fL 9.5 10.1  10.0  10.3   SEGS Latest Units: % 54.7 63.6  54.8 61.6   Lymps Latest Units: % 36.7 28.4  37.1 30.1   Monocytes Latest Units: % 7.3 7.3  7.4 7.7   Eosinophils Latest Units: % 0.9 0.6  0.5 0.4   Basophils Latest  Units: % 0.4 0.1  0.2 0.2   BANDS Latest Units: % CANCELED CANCELED  CANCELED CANCELED   Metamyelocytes Latest Units: % CANCELED CANCELED  CANCELED CANCELED   Myelocytes Latest Units: % CANCELED CANCELED  CANCELED CANCELED   Promyelocytes Latest Units: % CANCELED CANCELED  CANCELED CANCELED   Blasts Latest Units: % CANCELED CANCELED  CANCELED CANCELED   Reactive Lymphs Latest Ref Range: 0 - 10 % CANCELED CANCELED  CANCELED CANCELED   NRBC Latest Ref Range: 0 /100 WBC CANCELED CANCELED  CANCELED CANCELED   Abs Neutrophils Latest Ref Range: 1,500 - 7,800 cells/uL 2,899 4,643  3,178 3,450   Abs Lymphs Latest Ref Range: 850 - 3,900 cells/uL 1,945 2,073  2,152 1,686   Abs Monocytes Latest Ref Range: 200 - 950 cells/uL 387 533  429 431   Abs Eosinophils Latest Ref Range: 15 - 500 cells/uL 48 44  29 22   Abs Basophils Latest Ref Range: 0 - 200 cells/uL 21 7  12 11    Abs Band Neutrophils Latest Ref Range: 0 - 750 cells/uL CANCELED CANCELED  CANCELED CANCELED   Abs Metamyelocytes Latest Ref Range: 0 cells/uL CANCELED CANCELED  CANCELED CANCELED   Abs Myelocytes Latest Ref Range: 0 cells/uL CANCELED CANCELED  CANCELED CANCELED   Abs Promyelocytes Latest Ref Range: 0 cells/uL CANCELED CANCELED  CANCELED CANCELED   Abs Blasts Latest Ref Range: 0 cells/uL CANCELED CANCELED  CANCELED CANCELED   Abs NRBC Latest Ref Range: 0 cells/uL 0 0  0 CANCELED   Comments Unknown CANCELED CANCELED  CANCELED CANCELED     IMAGING DATA:    Reviewed the imaging with patient:    02-25-2020 MRI right knee showed IMPRESSION:     1. Attenuated posterior root of the lateral meniscus; query small tear.   2. Focal low-grade chondromalacia with questionable chondral delamination, measuring 0.6 cm in the weightbearing lateral femoral condyle.   3. Small Baker's cyst.     12-10-2019 DEXA showed osteoporosis is at T score -2.8.     07-01-2019 MRI of breasts showed     Result Impression   IMPRESSION:    1. Right breast: BI-RADS Category 4 - Suspicious.  Recommend US-guided core needle biopsy lumpectomy scar at 11:00 location given nodular plateau enhancement seen at the superior aspect of the scar. Findings and recommendations discussed with the patient on 07/01/19 at 12:30 pm.    2. Left breast: BI-RADS Category 1 - Negative.    OVERALL ASSESSMENT: BI-RADS Category 4.     05-26-2019 Mammogram showed   Result Impression     Area of distortion consistent with post surgical scarring in the left breast is benign. The findings and recommendations were discussed with the patient by Dr. Ricki Miller at the conclusion of this examination.    Suggest this patient return for her routine annual screening mammogram in 1 year.    ACR BI-RADS Category 2 - Benign Finding.     06-09-2018 Mammogram showed    Result Impression     Stable post lumpectomy scarring and radiation therapy change in the right breast is benign. Note that distortion from scarring can obscure underlying lesions.    Suggest this patient return for her routine annual  screening mammogram in 1 year.    ACR BI-RADS Category 2 - Benign Finding.     June 03, 2017 bone density shows osteopenia T-score of -2.4 in the left femoral neck.    06-03-2017 Mammogram showed Stable post lumpectomy scarring and radiation therapy change in the right breast is benign.    Suggest this patient return for her routine annual screening mammogram in 1 year.    ACR BI-RADS Category 2 -Benign Finding.    05/24/2016 bilateral diagnostic 2D and 3D mammogram shows stable post lumpectomy changes in the right breast are benign.  December 27, 2015 echocardiogram shows a normal ejection fraction.  05-24-2015 bilateral diagnostic mammogram and bilateral breast ultrasound showed Kayla post lumpectomy scar in the right breast is benign, stable punctate calcifications in the area areolar region of the right breast are benign, stable punctate calcifications in the left breast at 6 o'clock are benign with no evidence of malignancy.  11/22/2014 bilateral  2D and 3D mammogram shows Kayla punctate calcifications in the periareolar region of the right breast are probably benign in mammographic follow-up in 6 months is recommended, Kayla post lumpectomy scar in the right breast is benign. Kayla punctate calcifications in the left breast at 6 o'clock up probably benign.  11/22/2014 bone density shows osteoporosis with a T-score of-2.6 in the right femoral neck.  August 31, 2014 echocardiogram shows left ventricular diastolic dysfunction, normal left ventricular size and systolic function.    PATHOLOGY:    I reviewed the report with her:    07/07/2019 RIGHT BREAST, 11:00 AT 7 CM, CORE BIOPSY:  Fat necrosis and scar, consistent with prior procedure site changes.  No carcinoma identified.    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  09/27/2014 left thyroid nodule atypical follicular lesion of undetermined significance, a right thyroid nodule fine-needle aspiration negative for malignancy.    IMPRESSION/PLAN:    Problem # 1: BREAST CANCER      Initial clinical stage IIA pT2N0 right IDC s/p bx 01/14/14 (ER/PR negative, Her-2 positive by copy number = 6.6, Ki-67=50%). MRI breasts 01/29/14 showed lesion to be 2.2 x 1.6 x 2.0 cm. Staging PET/CT negative for distant mets. Neoadjuvant chemotherapy with Her-2 targeted therapy was suggested, since tumor size > 2cm, so recommended TCH + P (Taxotere, Carboplatin, Herceptin, Perjeta) q3wks x 6 cycles. Systemic therapies started 02/17/14 but pt developed severe diarrhea causing dehydration and electrolyte abnormalities requiring daily hydration and electrolyte replacements so changed regimen to weekly Taxol x 12 weeks with Herceptin/Perjeta q3wks and tolerated only slightly better. Right breast mass resolved on physical exam. Pt was becoming weaker due to ongoing diarrhea on Taxol so discontinued  after 04/28/14 dose with plan for repeat breast imaging followed by surgery if response was good. Breast imaging 04/28/14 (mammo, u/s) and MRI 04/30/14 showed minimal residual disease so Taxol was discontinued after 04/28/14 dose (week #8) and Herceptin/Perjeta continued. Right lumpectomy + SLNB 05/28/14 showed 6 mm residual disease, grade 2, SBR=7/9 (lower mitotic rate than bx), 0/1 LN; repeat ER/PR negative. Since only 5 doses of Perjeta were given pre-operatively and there was residual disease in surgical specimen, she was given the final 6th dose of Perjeta 06/02/14 with usual q3wk dose of Herceptin. Single-agent Herceptin continued q3wk and completed a 1-yr course in 12-2014 and since then no therapy. The radiation therapy was completed.Clinically no Kayla findings in the  most recent was reviewed a repeat breast MRI was ordered.  The repeat breast MRI showed a BI-RADS category 4 in the right breast for which she under went a biopsy and it confirmed only fat necrosis and no Kayla concerns on today's exam and repeat mammogram was ordered.  No further intervention is necessary will continue to monitor. She was encouraged to continue breast self-examination.    Problem # 2: ACUTE DVT OF UPPER EXTREMITY      LUE acute DVT dx'ed by u/s 02/10/14 due to sudden onset of left arm swelling, completed anticoagulation and no evidence of recurrent thrombosis. May consider to remove PORT was concerned about chance to develop DVT but advised that removing the PORT does not usually cause a DVT.    Problem # 3: ABNORMAL ECHO      Baseline ECHO 02/10/14 with normal EF of 65-70% but with mild diastolic dysfunction. I advised her to continue f/u with Dr. Reubin Milan for monitoring of cardiac function and recommended again a repeat ECHO and will retrieve the report from 2019.    Problem # 4: THYROID NODULE       She had a total thyroidectomy and no malignancy found. Follow-up with Endocrinology or now PCP.    PROBLEM # 5  OSTEOPOROSIS    Repeat bone density was reviewed now she has osteopenia and remains on Fosamax and also vitamin-D and calcium. Repeat DEXA reviewed and now has osteoporosis and discussed Prolia and gave educational material and needs to obtain dental clearance.    PROBLEM # 6 MELANOMA    No Kayla complaints and followed by dermatology. Discussed signs and symptoms suggestive of recurrent disease but so far only SCCA and no Kayla melanoma.     Anemia and explained the relationship between worsening creatinine and anemia and encouraged to stay well hydrated.    Cancer Patients and COVID19    We reviewed that due to emerging data relating to vaccination efficacy in patients with active malignancy, recommendations are based on the expert opinion of the committee; as data continue to become available, our approach will be modified accordingly. Decisions about when to offer COVID-19 vaccines should also take into account the [employer], Engineer, production, and Medicine (NASEM) Framework for [employer] of COVID-19 Vaccine that includes: risk of infection, severe morbidity and mortality, excess burden of COVID-19 on specific communities, and transmission to others. The key principles are as follows:  1. We strongly recommend that patients with cancer receive vaccination as soon as eligible given their increased risk of morbidity and mortality from COVID-19.  2. Persons with active cancer are at increased risk of complications from SARS-CoV-2, and efforts to limit spread in high-risk patients at cancer centers is imperative. Cancer centers should be specified locations where vaccines should be allocated to allow safe delivery to these high-risk patients.  3. A simple and rapid approach to vaccination is important.  4. Vaccines should be offered to patients with cancer in a manner to assure inclusion of racial/ethnic minorities, non-English-speaking patients, and other at-risk groups (eg, patients with  disabilities) to ensure equity in COVID-19 vaccine distribution; health care systems should make special efforts to take into consideration social vulnerability markers that have been demonstrated during this pandemic.  5. To date, there are no reports of increased risk for side effects of the COVID-19 vaccines in patients with cancer as compared to the general population.  6. Vaccine efficacy in the setting of cancer care and a weakened immune system is  thought to be less robust than in the general population, particularly for hematologic malignancies. However, vaccination is strongly recommended for all eligible patients with cancer.  7. Those immediately around patients under cancer care (eg, spouses, household members) are the most likely to be sources of transmission and should be vaccinated as soon as possible.  8. The committee strongly supports vaccine mandates for health careworkers.    Adapted from http://www.cook.org/:    Adapted from the CDC "Symptoms and Caring for Yourself at Home"    Discussed signs and symptoms of COVID19 and advised to immediately contact us for any Kayla problems. Pros and cons of vaccination were reviewed and completed the 3rd booster. Do not recommend the 4th booster for now.     Reported illnesses have ranged from mild symptoms to severe illness and death for confirmed coronavirus disease 2019 (COVID-19) cases.    These symptoms may appear 2-14 days after exposure (based on the incubation period of MERS-CoV viruses).    Coronavirus symptoms include, but are not limited to:    Marland Kitchen Fever or chills  . Cough  . Shortness of breath or difficulty breathing  . Fatigue  . Muscle or body aches  . Headache  . Kayla loss of taste or smell  . Sore throat  . Congestion or runny nose  . Nausea or vomiting  . Diarrhea    If you have possible or confirmed COVID-19:  . Stay home from work, school, and away from  other public places.   . If you must go out, avoid using any kind of public transportation, ridesharing, or taxis.   . Monitor your symptoms carefully.   . If your symptoms get worse, call your healthcare provider immediately.   . Get rest and stay hydrated. If you have a medical appointment, call the healthcare provider ahead of time and tell them that you have or may have COVID-19.   . For medical emergencies, call 911 and notify the dispatch personnel that you have or may have COVID-19.   . Cover your cough and sneezes.   Wendee Copp your hands often with soap and water for at least 20 seconds or clean your hands with an alcohol-based hand sanitizer that contains at least 60% alcohol.   . As much as possible, stay in a specific room and away from other people in your home.   . Also, you should use a separate bathroom, if available.   . If you need to be around other people in or outside of the home, wear a facemask.   Marland Kitchen Avoid sharing personal items with other people in your household, like dishes, towels, and bedding.   . Clean all surfaces that are touched often, like counters, tabletops, and doorknobs. Use household cleaning sprays or wipes according to the label instructions.    If you develop emergency warning signs for COVID-19 get medical attention immediately. Emergency warning signs include*:  . Trouble breathing   . Persistent pain or pressure in the chest   . Kayla confusion or inability to arouse   . Bluish lips or face  *This list is not all inclusive. Please consult your medical provider for any other symptoms that are severe or concerning.    There is no specific antiviral treatment recommended for COVID-19. People with COVID-19 should receive supportive care to help relieve symptoms. For severe cases, treatment should include care to support vital organ functions.    Preliminary evidence suggests that COVID-19 not only detrimentally affects respiratory  organs, but also can result in "heart inflammation,  acute kidney disease, neurological malfunction, blood clots, intestinal damage and liver problems."    People who think they may have been exposed to COVID-19 should contact their healthcare provider immediately.    PLAN:    1. Completed single-agent Herceptin q3wks to complete 1 yr total course (06/23/14 thru 01/19/15).  Repeat mammogram ordered and pending but remains off therapy.  2. Anemia remains mild and creatinine not worsened. For now continue observation.   3. ECHO to be repeated as per Dr. Reubin Milan of cardiology.    4.Thyroidectomy done due to atypical left thyroid nodule and no malignancy, follow-up with Endocrinology.  5. Renal insufficiency and diabetes may contribute to anemia but the anemia is not worsened for now.  6. History of melanoma and she is followed by Dermatology. No Kayla concerns.    Orders Placed This Encounter   Procedures   . Diagnostic Mammogram With Digital Breast Tomosynthesis - Bilateral   . CBC w/ Diff Lavender   . [place] 27.29, BLOOD   . Comprehensive Metabolic Panel   . Vitamin D, 25-OH Total Yellow serum separator tube     Thank you very much for allowing our continued participation in the care of this patient.     Medical decision-making was highly complex  and more than 50% of this _40_ min visit was spent in educating the patient about their condition, discussing compliance issues, counseling including answering all of the patients questions and coordination of care. This included review of relevant laboratory, radiology and other diagnostic tests, explaining medical management choices and the patient verbalized understanding.                     RTO: 4-6 months    I certify that I have reviewed the documentation contained in this clinical record and that it is accurately recorded. This document contains private and confidential health information protected by state and federal law and any release of this information requires the written prior  authorization of the above mentioned patient.    Please note this report was dictated with the use of voice recognition software.  It may contain inadvertent spelling or grammatical errors which were not detected during the editing process.       Take a virtual tour of our facility: [email or web address]          --------------------------------------  [name], MD, MBA

## 2020-12-20 NOTE — Patient Instructions (Signed)
Orders Placed This Encounter   Procedures   . Diagnostic Mammogram With Digital Breast Tomosynthesis - Bilateral   . CBC w/ Diff Lavender   . [place] 27.29, BLOOD   . Comprehensive Metabolic Panel   . Vitamin D, 25-OH Total Yellow serum separator tube

## 2021-01-21 ENCOUNTER — Other Ambulatory Visit: Payer: Self-pay | Admitting: Hematology & Oncology

## 2021-01-21 DIAGNOSIS — M8580 Other specified disorders of bone density and structure, unspecified site: Secondary | ICD-10-CM

## 2021-01-30 MED ORDER — ALENDRONATE SODIUM 70 MG OR TABS
70.00 mg | ORAL_TABLET | ORAL | 0 refills | Status: DC
Start: 2021-01-30 — End: 2021-12-19

## 2021-02-16 ENCOUNTER — Other Ambulatory Visit: Payer: Self-pay | Admitting: Hematology & Oncology

## 2021-02-16 DIAGNOSIS — G629 Polyneuropathy, unspecified: Secondary | ICD-10-CM

## 2021-02-20 MED ORDER — GABAPENTIN 300 MG OR CAPS
300.00 mg | ORAL_CAPSULE | Freq: Three times a day (TID) | ORAL | 0 refills | Status: DC
Start: 2021-02-20 — End: 2021-03-24

## 2021-02-22 ENCOUNTER — Other Ambulatory Visit: Payer: Self-pay | Admitting: Hematology & Oncology

## 2021-02-22 DIAGNOSIS — G629 Polyneuropathy, unspecified: Secondary | ICD-10-CM

## 2021-03-24 ENCOUNTER — Other Ambulatory Visit: Payer: Self-pay | Admitting: Hematology & Oncology

## 2021-03-24 DIAGNOSIS — G629 Polyneuropathy, unspecified: Secondary | ICD-10-CM

## 2021-03-24 MED ORDER — GABAPENTIN 300 MG OR CAPS
300.0000 mg | ORAL_CAPSULE | Freq: Three times a day (TID) | ORAL | 0 refills | Status: AC
Start: 2021-03-24 — End: ?

## 2021-05-04 ENCOUNTER — Other Ambulatory Visit: Payer: Self-pay | Admitting: Hematology & Oncology

## 2021-05-04 DIAGNOSIS — G629 Polyneuropathy, unspecified: Secondary | ICD-10-CM

## 2021-05-05 ENCOUNTER — Other Ambulatory Visit: Payer: Self-pay | Admitting: Hematology & Oncology

## 2021-05-05 DIAGNOSIS — G629 Polyneuropathy, unspecified: Secondary | ICD-10-CM

## 2021-05-05 NOTE — Telephone Encounter (Addendum)
VM received on 05/05/21 @1643  from pt stating that [facility] informed pt that refill request for Gabapentin has not been received from Dr Arletha Grippe office. Pt stating that she is completely out of the medication and needs a refill asap. Pt provided call back# [phone].    Refill request prompted to Dr. Dorris Singh.   Unable to refill due to "Anticonvulsants Protocol Failed"       [name], MD  You; Chelsea Aus, RN Yesterday (8:52 AM)       Juluis Rainier   Anticonvulsants Protocol Failed 05/08/2021 08:35 AM   Protocol Details Normal Creatinine in past 12 months   Normal Sodium in past 12 months   Patient is at least 75 years old   No active pregnancy on record   Visit with provider in authorizing department in past 12 months or upcoming 30 days   Medication not refilled in past 45 days (1.5 months) OR Reorder of active chronic medication   No pregnancy test in the past 12 months or most recent test was negative   Patient is not Postpartum (within 60 days of delivery)   Normal ALT in past 12 months     Cannot refill. Needs to discuss with PCP.      05-09-2021  0930    Patient notified and advised to follow up with her PCP.   States she has an upcoming appt and she will have labs done then. Reports she made a mistake, she is not our of her medications, she still has one bottle left.     MVillaverde RN

## 2021-05-15 ENCOUNTER — Telehealth: Payer: Self-pay | Admitting: Hematology & Oncology

## 2021-05-15 NOTE — Telephone Encounter (Addendum)
Kayla Joseph, called stating she was trying to schedule a mammogram, but was unable to schedule do to her answering on a question that a while back she had felt a lump on her Right breast.  Patient tried to explain to the scheduler that she no longer feels it and does not have any pain. [facility] radiology stated they are unable to schedule until she calls the office and Has Dr Dorris Singh, decide if she should order.      I informed patient Dr. Dorris Singh is out of the office and I would send the message to a nurse and someone will call her back with further instructions.   Kayla Joseph, verbally stated she understood and will wait for a call.       [name]  05/15/2021    05/18/21 9:49--I contacted patient; patient stated she had felt a lump under her right armpit about 2 months ago, stated when she called [facility] to schedule the mammogram she was asked if she had any new symptoms, patient stated she mentioned to the scheduler that she had felt the lump under her right armpit 2 months ago but did not feel it while talking with the scheduler; patient stated the scheduler informed her that [place] requires that the ordering physician be informed of any new symptoms prior to scheduling a mammogram to confirm the appropriate imaging to be ordered; I informed patient that Dr. Dorris Singh is out of clinic through next Tuesday and I will discuss with her when she returns and call her back with instructions, to hold the mammogram at this time until order is clarified with Dr. Dorris Singh; patient verbalized understanding and in agreement.      --LWitherby RN    05-26-2021  1627   Dr. Dorris Singh aware, ordered for patient to proceed with Mammogram.     Pt notified.   Informed that I will call [facility] on Monday to advise them that the patient may proceed with her mammogram.     [facility] RN

## 2021-05-29 ENCOUNTER — Telehealth: Payer: Self-pay

## 2021-05-29 NOTE — Telephone Encounter (Signed)
Spoke to [place] with [facility] scheduling, patient may proceed with her Mammogram.      see phone note from 05-15-2021    [place]

## 2021-06-16 LAB — COMPREHENSIVE METABOLIC PANEL, BLOOD
Albumin: 4 g/dL (ref 3.6–5.1)
Chloride: 105 mmol/L (ref 98–110)
EGFR: 43 mL/min/{1.73_m2} — ABNORMAL LOW (ref >=60)
Glucose: 119 mg/dL — ABNORMAL HIGH (ref 65–99)
Total Protein: 6.5 g/dL (ref 6.1–8.1)

## 2021-06-16 LAB — VITAMIN D, 25-OH TOTAL

## 2021-06-16 LAB — CBC WITH DIFF, BLOOD
Abs Eosinophils: 28 cells/uL (ref 15–500)
Abs Lymphs: 2170 cells/uL (ref 850–3900)
RDW: 11.8 % (ref 11.0–15.0)
WBC: 7 10*3/uL (ref 3.8–10.8)

## 2021-06-17 LAB — CBC WITH DIFF, BLOOD
Abs Basophils: 21 cells/uL (ref 0–200)
Abs Monocytes: 630 cells/uL (ref 200–950)
Abs Neutrophils: 4151 {cells}/uL (ref 1500–7800)
Basophils: 0.3 %
Eosinophils: 0.4 %
HCT: 34.4 % — ABNORMAL LOW (ref 35.0–45.0)
HGB: 11.3 g/dL — ABNORMAL LOW (ref 11.7–15.5)
Lymps: 31 %
MCH: 31 pg (ref 27.0–33.0)
MCHC: 32.8 g/dL (ref 32.0–36.0)
MCV: 94.2 fL (ref 80.0–100.0)
MPV: 9.9 fL (ref 7.5–12.5)
Monocytes: 9 %
PLT: 195 10*3/uL (ref 140–400)
RBC: 3.65 10*6/uL — ABNORMAL LOW (ref 3.80–5.10)
SEGS: 59.3 %

## 2021-06-17 LAB — COMPREHENSIVE METABOLIC PANEL, BLOOD
ALT (SGPT): 6 U/L (ref 6–29)
AST (SGOT): 12 U/L (ref 10–35)
Albumin/Glob Ratio: 1.6 (calc) (ref 1.0–2.5)
Alkaline Phos: 102 U/L (ref 37–153)
BUN/Creatinine Ratio: 20 (calc) (ref 6–22)
BUN: 25 mg/dL (ref 7–25)
Bilirubin, Total: 1 mg/dL (ref 0.2–1.2)
Calcium: 8.8 mg/dL (ref 8.6–10.4)
Carbon Dioxide: 23 mmol/L (ref 20–32)
Creatinine: 1.28 mg/dL — ABNORMAL HIGH (ref 0.60–1.00)
Globulin: 2.5 g/dL (calc) (ref 1.9–3.7)
Potassium: 5 mmol/L (ref 3.5–5.3)
Sodium: 136 mmol/L (ref 135–146)

## 2021-06-17 LAB — ~~LOC~~ 27.29, BLOOD: ~~LOC~~ 27.29: 15 U/mL (ref <38)

## 2021-06-20 ENCOUNTER — Encounter: Payer: Self-pay | Admitting: Hematology & Oncology

## 2021-06-20 ENCOUNTER — Ambulatory Visit (INDEPENDENT_AMBULATORY_CARE_PROVIDER_SITE_OTHER): Payer: Medicare Other | Admitting: Hematology & Oncology

## 2021-06-20 VITALS — BP 121/60 | HR 82 | Temp 97.1°F | Resp 18 | Ht 65.0 in | Wt 190.7 lb

## 2021-06-20 DIAGNOSIS — Z171 Estrogen receptor negative status [ER-]: Secondary | ICD-10-CM

## 2021-06-20 DIAGNOSIS — M810 Age-related osteoporosis without current pathological fracture: Secondary | ICD-10-CM

## 2021-06-20 DIAGNOSIS — M8589 Other specified disorders of bone density and structure, multiple sites: Secondary | ICD-10-CM

## 2021-06-20 DIAGNOSIS — C4361 Malignant melanoma of right upper limb, including shoulder: Secondary | ICD-10-CM

## 2021-06-20 DIAGNOSIS — C50411 Malignant neoplasm of upper-outer quadrant of right female breast: Secondary | ICD-10-CM

## 2021-06-20 NOTE — Patient Instructions (Signed)
Orders Placed This Encounter   Procedures    X-RAY Dexa (Bone Density) Skeletal    CBC w/ Diff Lavender    [place] 27.29, BLOOD    Comprehensive Metabolic Panel

## 2021-06-20 NOTE — Progress Notes (Signed)
Kayla Joseph A. Kayla Singh, MD, MBA  Clinical Professor  Division of Hematology/Oncology, Department of Medicine  Medical Director, [address], [place] [address]  Tel: [phone]  Fax: [phone]                                        HEMATOLOGY ONCOLOGY FOLLOW UP NOTE     Date of Service:   06/20/2021       Kayla Joseph, DOB 1946/09/29    REFERRING MD: Kayla Joseph  Radiation oncology: Kayla Joseph  PCP: Kayla Joseph  GYN: Kayla Joseph   Cardiology: Kayla Joseph    CHIEF COMPLAINT: Routine follow-up visit for right breast cancer and melanoma    HISTORY OF PRESENT ILLNESS:    75 y/o Caucasian woman with mammographically detected clinical stage IIA T2N0 right breast invasive ductal carcinoma s/p bx 01/14/14 (SBR 9/9, ER/PR negative, Ki-67=50%, Her-2/neu FISH ratio=1.14 but copy number=6.6 so positive). MRI breast 01/29/14 showed mass to be 2.2 x 1.6 x 2.0 cm. Staging PET/CT 01/27/14 was negative for distant mets; showed only known right breast tumor and incidental left thyroid nodule. Planned neoadjuvant systemic therapy: TCH + P x 6 cycles followed by surgery and radiation therapy. Cycle #1 TCH+P given 02/17/14. Pt developed severe diarrhea and dehydration causing electrolyte derangements requiring daily replacement therapies and hydration. Therefore, chemotherapy was changed to weekly Taxol for 12 weeks and Herceptin plus Perjeta every 3 weeks was continued. Taxol started 03/10/14 and completed only 8 weeks on 04/28/14 due to cumulative severe side effects and MRI breast 04/30/14 showing only 42m of residual enhancement. Herceptin/Perjeta continued q3wks for 6 total cycles. Right lumpectomy + SLNB done 05/28/14 (6 mm residual IDC, grade 2, SBR 7/9, no LI, 0/1 LN; ypT1bN0; residual tumor had lower a mitotic score compared to initial biopsy specimen likely due to  chemotherapy effect; repeat ER and PR on residual tumor was again negative for both). Due to residual disease although minimal, pt received final dose (#6) of Perjeta 06/02/14. Herceptin single-agent q3wks started 06/23/14.     July 14, 2014 (Herceptin week #22)     Started single-agent Herceptin 06/23/14 and tolerated it well without side effects. Hair is starting to grow. Neuropathy in the fingers and bottoms of the feet continue but improve when she takes gabapentin 300 mg 3 times a day. She does not want to increase the dose since it is controlling her neuropathy and she does not want to increase fatigue due to medication side effect. She just returned from her trip to [place] continues but it was improved with more solid form during her [place]. The only difference during that trip was that her friends were cooking all of her meals for her so a change in diet seems to have improved the diarrhea. Since her return, her diarrhea has resumed unchanged. She has about 6 watery episodes of diarrhea per day and has had 3 episodes thus far this morning. She continues Percocet as needed but also has a prior prescription for more tincture of opium which she will change back to since postop pain has resolved. She has not yet done repeat stool studies since she was on her trip but will do so soon with prior order. She has not seen the gastroenterologist  yet but will ask her primary care doctor for a referral to see one soon. Dysuria symptoms recurred this past weekend. Bactrim worked well in the past after the 1st dose. She saw Dr Kayla Joseph 06/25/14 and will start xrt next week on 07/21/14. She receives hydration every 3 weeks with Herceptin but requests more frequent visits since her diarrhea is ongoing. Labs (CBC, CMP) 06/02/14 sig for H/H=10.6/32.9; gluc=221.   The patient completed the Herceptin every 3 weeks for total of 1 year of adjuvant therapy in 12-2014 and remains off therapy.      INTERVAL HISTORY:    The patient went for a repeat breast MRI and had a BI-RADS category 4 finding in the right breast and underwent a right breast biopsy and it was negative. She reports no new breast related issues.  She is here to review the repeat mammogram.  She remains on vitamin-D and calcium and stopped the alendronate for osteopenia and is wondering when the next DEXA would be.. She is followed by dermatology for melanoma and denies any new skin lesions. She denies any bleeding. She is worried about COVID-19 but denies any fevers or chills or cough or shortness of breath and she had the vaccine and tolerated it well and completed the 3rd booster.    11-20-2019 1st vaccine Kayla Joseph  12-11-2019 2nd vaccine Kayla Joseph HISTORY:     Reviewed and no changes.    Stage IIA T2N0 right breast IDC --> ypT1bN0   LUE DVT dx'ed by U/S 02/10/14 -- Lovenox started same day; resolved on 05/18/14 U/S; completed Lovenox 06/07/14   Stage I pT1aN0 melanoma of upper back s/p excision  DM II  Hyperlipidemia  Hypertension  BCC skin  Thyroid nodules bilaterally   She had a colonoscopy in 2018 and to be repeated in 2023.       PAST SURGICAL HISTORY:     Reviewed and no changes.    05/28/14 Right lumpectomy + SLNB   02/05/14 L. chest port placement ([facility])  07/04/11 Wide excision upper back melanoma + R. supraclavicular SLNB (lentigo melanoma and melanoma in situ, 0.75 mm, no residual invasive melanoma, 0/1 LN)   2013 Facelift  2012 Gastric bypass  1999 Cholecystectomy  1995 Left foot fracture repair with pins  Age 75 Tonsillectomy     PAST OB/GYN HISTORY:     Reviewed and no changes.     G0P0. Menarche at 65. OCP x 15 yrs; last age 89s. HRT late 65s for <27mhs; stopped after [facility] study results reported. LMP age 75      HEALTH MAINTENANCE:    Colo -- 3-4 yrs ago; repeat due next year   DXA -- 2015 osteopenia; prescribed  Fosamax but never started   Pap -- 11/2013 (one prior abnl pap and polyp removed)      FAMILY HISTORY:    Reviewed and no changes.    Mother -- died 847of CHF  Father -- died 841of decline after stroke  Sister -- died 427of NHL; another sister alive and well     SOCIAL HISTORY:                Reviewed and no changes    Lives with partner, Kayla Joseph   Kids -- none   Retired [employer] developer   Tobacco -- only 4 yrs in early 20s, <1ppd  Alcohol - 1 glass wine/mth at most  Drugs - none      CURRENT MEDS:    I reviewed the medication list with the patient.    ALLERGIES:     No Known Allergies    REVIEW OF SYSTEMS (ROS):    A comprehensive 15-point ROS was performed and reviewed with the patient and is negative unless noted above.     EXAM:    Vitals 07/16/2019 12/17/2019 06/22/2020 12/20/2020 06/20/2021   Taken/Reported - - - In Clinic In Clinic   Systolic [phone]   Diastolic 80 73 77 75 60   Position _0    Site Left arm Left arm Left arm Left arm Right arm   Cuff Size Regular Regular Regular Regular -   Pulse 88 93 73 84 82   Resp _1 Temp 96.6 96.8 97.3 96.6 97.1   Temp Source _2 Weight (kg) - 85.73 kg 86.637 kg 88.451 kg 86.5 kg   Weight (lbs) - 189 lb 191 lb 195 lb 190 lb 11.2 oz   Height (cm) 164.3 cm 165.1 cm 165.1 cm 165.1 cm 165.1 cm   Height (ft in) 5' 4.7" _3  _4  _5  _6    BMI (kg/m2) - 31.45 kg/m2 31.78 kg/m2 32.45 kg/m2 31.73 kg/m2   BSA (m2) - 1.98 m2 1.99 m2 2.01 m2 1.99 m2   Fall Risk No fall risk identified No fall risk identified No fall risk identified No fall risk identified No fall risk identified   Some recent data might be hidden     GENERAL: The patient is well developed and well nourished, ambulatory, in no acute distress.  HEENT: normocephalic/atraumatic, anicteric sclera.  HEART: RRR. No murmurs.  CHEST: Clear to auscultation bilaterally.  AXILLAE: No palpable lesions bilaterally.  BREASTS: Were examined  bilaterally, no new concerning palpable breast lesions, no nipple discharge bilaterally.   ABDOMEN: soft, non-tender, non-distended, normal bowel sounds, no rebound or guarding, no appreciable masses  EXTREMITIES: No edema of the lower extremities.  SKIN:  No rash or jaundice.  NEURO: AOx3, EOMI, no focal deficits, gait steady.  PSYCHIATRIC: Good insight adequate mood and affect.    LAB DATA:    Reviewed the lab work with patient:    Results for KEYAUNA, GRAEFE (MRN [id number])      Ref. Range 06/03/2019 10:14 12/11/2019 11:02 06/15/2020 11:01 12/14/2020 09:25 06/16/2021 08:21   Sodium Latest Ref Range: 135 - 146 mmol/L 136  139 134 (L) 136   Potassium Latest Ref Range: 3.5 - 5.3 mmol/L   5.2 5.2 5.0   Potassium Latest Ref Range: 3.4 - 4.8 mmol/L 4.7       Chloride Latest Ref Range: 98 - 110 mmol/L 104  108 105 105   Carbon Dioxide Latest Ref Range: 20 - 32 mmol/L _7 (L) 23   BUN Latest Ref Range: 7 - 25 mg/dL _8 Creatinine Latest Ref Range: 0.60 - 1.00 mg/dL 1.10 (H)  1.17 (H) 1.19 (H) 1.28 (H)   eGFR non-Afr.American Latest Ref Range: > OR = 60 mL/min/1.48m 49 (L)  45 (L) 44 (L)    eGFR African American Latest Ref Range: > OR = 60 mL/min/1.778m57 (L)  52 (L) 51 (L)    Glucose Latest Ref Range: 65 - 99 mg/dL 156 (H)  98 126 (H) 119 (H)   Calcium Latest Ref Range: 8.6 -  10.4 mg/dL 8.7  8.7 8.9 8.8   Protein, Total Latest Ref Range: 6.4 - 8.4 g/dL 7.2       Total Protein Latest Ref Range: 6.1 - 8.1 g/dL   6.6 6.6 6.5   Alkaline Phos Latest Ref Range: 37 - 153 U/L 117  96 96 102   AST (SGOT) Latest Ref Range: 10 - 35 U/L _0 ALT (SGPT) Latest Ref Range: 6 - 29 U/L _1 Albumin Latest Ref Range: 3.6 - 5.1 g/dL 4.1  3.9 4.2 4.0   Bilirubin, Total Latest Ref Range: 0.2 - 1.2 mg/dL 0.9  1.3 (H) 1.3 (H) 1.0   BUN/Creatinine Ratio Latest Ref Range: 6 - 22 (calc) _2 CA 27.29 Latest Ref Range: <38 U/mL _3 Ferritin Latest Ref Range: 16 - 288 ng/mL  25 16     Globulin  Latest Ref Range: 1.9 - 3.7 g/dL (calc)   2.7 2.4 2.5   Globulin Latest Ref Range: 2.2 - 4.0 g/dL (calc) 3.1       Albumin/Glob Ratio Latest Ref Range: 1.0 - 2.5 (calc)   1.4 1.8 1.6   Albumin/Globulin Ratio Latest Ref Range: 0.9 - 2.3 (calc) 1.3       Iron Latest Ref Range: 45 - 160 mcg/dL  118 131     IIBC Latest Ref Range: 250 - 450 mcg/dL (calc)  349 380     Iron Saturation Latest Ref Range: 16 - 45 % (calc)  34 34     Vitamin D, 25-OH, Total Latest Ref Range: 30 - 100 ng/mL 48  35 37 51   WBC Latest Ref Range: 3.8 - 10.8 Thousand/uL 7.3  5.8 5.6 7.0   RBC Latest Ref Range: 3.80 - 5.10 Million/uL 4.08  3.67 (L) 3.71 (L) 3.65 (L)   HGB Latest Ref Range: 11.7 - 15.5 g/dL 12.5  11.1 (L) 11.1 (L) 11.3 (L)   HCT Latest Ref Range: 35.0 - 45.0 % 37.1  33.5 (L) 33.8 (L) 34.4 (L)   MCV Latest Ref Range: 80.0 - 100.0 fL 90.9  91.3 91.1 94.2   MCH Latest Ref Range: 27.0 - 33.0 pg 30.6  30.2 29.9 31.0   MCHC Latest Ref Range: 32.0 - 36.0 g/dL 33.7  33.1 32.8 32.8   RDW Latest Ref Range: 11.0 - 15.0 % 12.0  11.9 12.0 11.8   PLT Latest Ref Range: 140 - 400 Thousand/uL 217  193 234 195   MPV Latest Ref Range: 7.5 - 12.5 fL 10.1  10.0 10.3 9.9   SEGS Latest Units: % 63.6  54.8 61.6 59.3   Lymps Latest Units: % 28.4  37.1 30.1 31.0   Monocytes Latest Units: % 7.3  7.4 7.7 9.0   Eosinophils Latest Units: % 0.6  0.5 0.4 0.4   Basophils Latest Units: % 0.1  0.2 0.2 0.3   BANDS Latest Units: % CANCELED  CANCELED CANCELED CANCELED   Metamyelocytes Latest Units: % CANCELED  CANCELED CANCELED CANCELED   Myelocytes Latest Units: % CANCELED  CANCELED CANCELED CANCELED   Promyelocytes Latest Units: % CANCELED  CANCELED CANCELED CANCELED   Blasts Latest Units: % CANCELED  CANCELED CANCELED CANCELED   Reactive Lymphs Latest Ref Range: 0 - 10 % CANCELED  CANCELED CANCELED CANCELED   NRBC Latest Ref Range: 0 /100 WBC CANCELED  CANCELED CANCELED CANCELED   Abs Neutrophils Latest  Ref Range: 1,500 - 7,800 cells/uL 4,643  3,178 3,450 4,151   Abs  Lymphs Latest Ref Range: 850 - 3,900 cells/uL 2,073  2,152 1,686 2,170   Abs Monocytes Latest Ref Range: 200 - 950 cells/uL 533  429 431 630   Abs Eosinophils Latest Ref Range: 15 - 500 cells/uL 44  _0 Abs Basophils Latest Ref Range: 0 - 200 cells/uL _1 Abs Band Neutrophils Latest Ref Range: 0 - 750 cells/uL CANCELED  CANCELED CANCELED CANCELED   Abs Metamyelocytes Latest Ref Range: 0 cells/uL CANCELED  CANCELED CANCELED CANCELED   Abs Myelocytes Latest Ref Range: 0 cells/uL CANCELED  CANCELED CANCELED CANCELED   Abs Promyelocytes Latest Ref Range: 0 cells/uL CANCELED  CANCELED CANCELED CANCELED   Abs Blasts Latest Ref Range: 0 cells/uL CANCELED  CANCELED CANCELED CANCELED   Abs NRBC Latest Ref Range: 0 cells/uL 0  0 CANCELED CANCELED   Comments Unknown CANCELED  CANCELED CANCELED CANCELED   EGFR Latest Ref Range: > OR = 60 mL/min/1.21m     43 (L)     IMAGING DATA:    Reviewed the imaging with patient:    06-08-2021 Mammogram showed IMPRESSION:   Stable post-lumpectomy scar in the right breast is benign. Note that distortion from scarring can obscure underlying lesions.     Suggest this patient return for her routine annual screening mammogram in 1 year.     ACR BI-RADS Category 2 - Benign Finding.     02-25-2020 MRI right knee showed IMPRESSION:     1. Attenuated posterior root of the lateral meniscus; query small tear.   2. Focal low-grade chondromalacia with questionable chondral delamination, measuring 0.6 cm in the weightbearing lateral femoral condyle.   3. Small Baker's cyst.     12-10-2019 DEXA showed osteoporosis is at T score -2.8.     07-01-2019 MRI of breasts showed     Result Impression   IMPRESSION:    1. Right breast: BI-RADS Category 4 - Suspicious. Recommend US-guided core needle biopsy lumpectomy scar at 11:00 location given nodular plateau enhancement seen at the superior aspect of the scar. Findings and recommendations discussed with the patient on 07/01/19 at 12:30  pm.    2. Left breast: BI-RADS Category 1 - Negative.    OVERALL ASSESSMENT: BI-RADS Category 4.     05-26-2019 Mammogram showed   Result Impression     Area of distortion consistent with post surgical scarring in the left breast is benign. The findings and recommendations were discussed with the patient by Dr. LRicki Millerat the conclusion of this examination.    Suggest this patient return for her routine annual screening mammogram in 1 year.    ACR BI-RADS Category 2 - Benign Finding.     06-09-2018 Mammogram showed    Result Impression     Stable post lumpectomy scarring and radiation therapy change in the right breast is benign. Note that distortion from scarring can obscure underlying lesions.    Suggest this patient return for her routine annual screening mammogram in 1 year.    ACR BI-RADS Category 2 - Benign Finding.     June 03, 2017 bone density shows osteopenia T-score of -2.4 in the left femoral neck.    06-03-2017 Mammogram showed Stable post lumpectomy scarring and radiation therapy change in the right breast is benign.    Suggest this patient return for her routine annual screening mammogram in 1 year.  ACR BI-RADS Category 2 -Benign Finding.    05/24/2016 bilateral diagnostic 2D and 3D mammogram shows stable post lumpectomy changes in the right breast are benign.  December 27, 2015 echocardiogram shows a normal ejection fraction.  05-24-2015 bilateral diagnostic mammogram and bilateral breast ultrasound showed new post lumpectomy scar in the right breast is benign, stable punctate calcifications in the area areolar region of the right breast are benign, stable punctate calcifications in the left breast at 6 o'clock are benign with no evidence of malignancy.  11/22/2014 bilateral 2D and 3D mammogram shows new punctate calcifications in the periareolar region of the right breast are probably benign in mammographic follow-up in 6 months is recommended, new post lumpectomy scar in the right breast is  benign. New punctate calcifications in the left breast at 6 o'clock up probably benign.  11/22/2014 bone density shows osteoporosis with a T-score of-2.6 in the right femoral neck.  August 31, 2014 echocardiogram shows left ventricular diastolic dysfunction, normal left ventricular size and systolic function.    PATHOLOGY:    I reviewed the report with her:    07/07/2019 RIGHT BREAST, 11:00 AT 7 CM, CORE BIOPSY:  Fat necrosis and scar, consistent with prior procedure site changes.  No carcinoma identified.    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  09/27/2014 left thyroid nodule atypical follicular lesion of undetermined significance, a right thyroid nodule fine-needle aspiration negative for malignancy.    IMPRESSION/PLAN:    Problem # 1: BREAST CANCER      Initial clinical stage IIA pT2N0 right IDC s/p bx 01/14/14 (ER/PR negative, Her-2 positive by copy number = 6.6, Ki-67=50%). MRI breasts 01/29/14 showed lesion to be 2.2 x 1.6 x 2.0 cm. Staging PET/CT negative for distant mets. Neoadjuvant chemotherapy with Her-2 targeted therapy was suggested, since tumor size > 2cm, so recommended TCH + P (Taxotere, Carboplatin, Herceptin, Perjeta) q3wks x 6 cycles. Systemic therapies started 02/17/14 but pt developed severe diarrhea causing dehydration and electrolyte abnormalities requiring daily hydration and electrolyte replacements so changed regimen to weekly Taxol x 12 weeks with Herceptin/Perjeta q3wks and tolerated only slightly better. Right breast mass resolved on physical exam. Pt was becoming weaker due to ongoing diarrhea on Taxol so discontinued after 04/28/14 dose with plan for repeat breast imaging followed by surgery if response was good. Breast imaging 04/28/14 (mammo, u/s) and MRI 04/30/14 showed minimal residual disease so Taxol was discontinued after 04/28/14  dose (week #8) and Herceptin/Perjeta continued. Right lumpectomy + SLNB 05/28/14 showed 6 mm residual disease, grade 2, SBR=7/9 (lower mitotic rate than bx), 0/1 LN; repeat ER/PR negative. Since only 5 doses of Perjeta were given pre-operatively and there was residual disease in surgical specimen, she was given the final 6th dose of Perjeta 06/02/14 with usual q3wk dose of Herceptin. Single-agent Herceptin continued q3wk and completed a 1-yr course in 12-2014 and since then no therapy. The radiation therapy was completed.Clinically no new findings in the most recent was reviewed a repeat breast MRI was ordered.  The repeat breast MRI showed a BI-RADS category 4 in the right breast for which she under went a biopsy and it confirmed only fat necrosis and no new concerns on today's exam and repeat mammogram was ordered.  No further intervention is necessary will continue to monitor. She was encouraged to continue breast self-examination.    Problem # 2: ACUTE  DVT OF UPPER EXTREMITY      LUE acute DVT dx'ed by u/s 02/10/14 due to sudden onset of left arm swelling, completed anticoagulation and no evidence of recurrent thrombosis. May consider to remove PORT was concerned about chance to develop DVT but advised that removing the PORT does not usually cause a DVT.    Problem # 3: ABNORMAL ECHO      Baseline ECHO 02/10/14 with normal EF of 65-70% but with mild diastolic dysfunction. I advised her to continue f/u with Dr. Reubin Milan for monitoring of cardiac function and recommended again a repeat ECHO and will retrieve the report from 2019.    Problem # 4: THYROID NODULE       She had a total thyroidectomy and no malignancy found. Follow-up with Endocrinology or now PCP.    PROBLEM # 5 OSTEOPOROSIS    Repeat bone density was reviewed now she had osteopenia and stopped the  Fosamax and also vitamin-D and calcium. Repeat DEXA was ordered for 11-2021 and we will await the results. Advised weight bearing exercises and fall  precautions.    PROBLEM # 6 MELANOMA    No new complaints and followed by dermatology. Discussed signs and symptoms suggestive of recurrent disease but so far only SCCA and no new melanoma.     Anemia and explained the relationship between worsening creatinine and anemia and encouraged to stay well hydrated.    Cancer Patients and COVID19    Due to emerging data relating to vaccination efficacy in patients with active malignancy, recommendations are based on the expert opinion of the committee; as data continue to become available, our approach will be modified accordingly. Decisions about when to offer COVID-19 vaccines should also take into account the [employer], Engineer, production, and Medicine (NASEM) Framework for [employer] of COVID-19 Vaccine that includes: risk of infection, severe morbidity and mortality, excess burden of COVID-19 on specific communities, and transmission to others. The key principles are as follows:  1. We strongly recommend that patients with cancer receive vaccination as soon as eligible given their increased risk of morbidity and mortality from COVID-19.  2. Persons with active cancer are at increased risk of complications from SARS-CoV-2, and efforts to limit spread in high-risk patients at cancer centers is imperative. Cancer centers should be specified locations where vaccines should be allocated to allow safe delivery to these high-risk patients.  3. A simple and rapid approach to vaccination is important.  4. Vaccines should be offered to patients with cancer in a manner to assure inclusion of racial/ethnic minorities, non-English-speaking patients, and other at-risk groups (eg, patients with disabilities) to ensure equity in COVID-19 vaccine distribution; health care systems should make special efforts to take into consideration social vulnerability markers that have been demonstrated during this pandemic.  5. To date, there are no reports of increased risk  for side effects of the COVID-19 vaccines in patients with cancer as compared to the general population.  6. Vaccine efficacy in the setting of cancer care and a weakened immune system is thought to be less robust than in the general population, particularly for hematologic malignancies. However, vaccination is strongly recommended for all eligible patients with cancer.  7. Those immediately around patients under cancer care (eg, spouses, household members) are the most likely to be sources of transmission and should be vaccinated as soon as possible.  8. The committee strongly supports vaccine mandates for health careworkers.    Adapted from http://www.cook.org/:    Adapted  from the CDC "Symptoms and Caring for Yourself at Home"    Discussed signs and symptoms of COVID19 and advised to immediately contact us for any new problems. Pros and cons of vaccination were reviewed and completed the 3rd booster. Do not recommend the 4th dose.    Reported illnesses have ranged from mild symptoms to severe illness and death for confirmed coronavirus disease 2019 (COVID-19) cases.    These symptoms may appear 2-14 days after exposure (based on the incubation period of MERS-CoV viruses).    Coronavirus symptoms include, but are not limited to:    Marland Kitchen Fever or chills  . Cough  . Shortness of breath or difficulty breathing  . Fatigue  . Muscle or body aches  . Headache  . New loss of taste or smell  . Sore throat  . Congestion or runny nose  . Nausea or vomiting  . Diarrhea    If you have possible or confirmed COVID-19:  . Stay home from work, school, and away from other public places.   . If you must go out, avoid using any kind of public transportation, ridesharing, or taxis.   . Monitor your symptoms carefully.   . If your symptoms get worse, call your healthcare provider immediately.   . Get rest and stay hydrated. If you have a medical  appointment, call the healthcare provider ahead of time and tell them that you have or may have COVID-19.   . For medical emergencies, call 911 and notify the dispatch personnel that you have or may have COVID-19.   . Cover your cough and sneezes.   Wendee Copp your hands often with soap and water for at least 20 seconds or clean your hands with an alcohol-based hand sanitizer that contains at least 60% alcohol.   . As much as possible, stay in a specific room and away from other people in your home.   . Also, you should use a separate bathroom, if available.   . If you need to be around other people in or outside of the home, wear a facemask.   Marland Kitchen Avoid sharing personal items with other people in your household, like dishes, towels, and bedding.   . Clean all surfaces that are touched often, like counters, tabletops, and doorknobs. Use household cleaning sprays or wipes according to the label instructions.    If you develop emergency warning signs for COVID-19 get medical attention immediately. Emergency warning signs include*:  . Trouble breathing   . Persistent pain or pressure in the chest   . New confusion or inability to arouse   . Bluish lips or face  *This list is not all inclusive. Please consult your medical provider for any other symptoms that are severe or concerning.    There is no specific antiviral treatment recommended for COVID-19. People with COVID-19 should receive supportive care to help relieve symptoms. For severe cases, treatment should include care to support vital organ functions.    Preliminary evidence suggests that COVID-19 not only detrimentally affects respiratory organs, but also can result in "heart inflammation, acute kidney disease, neurological malfunction, blood clots, intestinal damage and liver problems."    People who think they may have been exposed to COVID-19 should contact their healthcare provider immediately.    PLAN:    1. Completed single-agent Herceptin q3wks to complete 1  yr total course (06/23/14 thru 01/19/15).  Repeat mammogram ordered and reviewed and no new concerns. Continue observation.  2. Anemia remains mild and  creatinine not worsened. For now continue observation.   3. ECHO to be repeated as per Dr. Reubin Milan of cardiology.    4.Thyroidectomy done due to atypical left thyroid nodule and no malignancy, follow-up with Endocrinology.  5. Renal insufficiency and diabetes may contribute to anemia but the anemia is not worsened for now.  6. History of melanoma and she is followed by Dermatology. Reports no new concerns.    Orders Placed This Encounter   Procedures   . X-RAY Dexa (Bone Density) Skeletal   . CBC w/ Diff Lavender   . CA 27.29, BLOOD   . Comprehensive Metabolic Panel     Thank you very much for allowing our continued participation in the care of this patient.     Medical decision-making was highly complex  and more than 50% of this _40_ min visit was spent in educating the patient about their condition, discussing compliance issues, counseling including answering all of the patients questions and coordination of care. This included review of relevant laboratory, radiology and other diagnostic tests, explaining medical management choices and the patient verbalized understanding.                                                                   RTO: 6 months    I certify that I have reviewed the documentation contained in this clinical record and that it is accurately recorded. This document contains private and confidential health information protected by state and federal law and any release of this information requires the written prior authorization of the above mentioned patient.    Please note this report was dictated with the use of voice recognition software.  It may contain inadvertent spelling or grammatical errors which were not detected during the editing process.       Take a virtual tour of our facility:  [email or web address]          --------------------------------------  [name], MD, MBA

## 2021-07-24 ENCOUNTER — Other Ambulatory Visit: Payer: Self-pay | Admitting: Hematology & Oncology

## 2021-07-24 DIAGNOSIS — G629 Polyneuropathy, unspecified: Secondary | ICD-10-CM

## 2021-07-27 ENCOUNTER — Telehealth: Payer: Self-pay | Admitting: Hematology & Oncology

## 2021-07-27 NOTE — Telephone Encounter (Addendum)
 Call received from pt stating that she needs a refill for Gabapentin medication urgently. Pt states that she is completely out and did not have any medication to take for today. Pt states that she was contacted by the pharmacy who informed pt that refill request has not been received from the office from last week and today. Writer informed pt that Dr [name] is out if the office this week. Pt is requesting for Dr Ramond Marrow nurse to contact pharmacy to clarify if pt can receive a couple of caps until Dr [name] returns to office next week. Pt also requesting for a call back today with an update. Pt can be contacted at [phone].    07-27-2021  Discussed refill request with Dr. [name]  Refill denied, to advise patient to follow up with her PCP    See phone message from 05-05-2021  Patient was advised to f/u with her PCP then     [facility] RN

## 2021-12-19 ENCOUNTER — Ambulatory Visit (INDEPENDENT_AMBULATORY_CARE_PROVIDER_SITE_OTHER): Payer: Medicare Other | Admitting: Hematology & Oncology

## 2021-12-19 ENCOUNTER — Encounter: Payer: Self-pay | Admitting: Hematology & Oncology

## 2021-12-19 VITALS — BP 113/72 | HR 114 | Temp 97.5°F | Resp 18 | Ht 65.0 in | Wt 183.8 lb

## 2021-12-19 DIAGNOSIS — M810 Age-related osteoporosis without current pathological fracture: Secondary | ICD-10-CM

## 2021-12-19 DIAGNOSIS — D508 Other iron deficiency anemias: Secondary | ICD-10-CM

## 2021-12-19 DIAGNOSIS — M8589 Other specified disorders of bone density and structure, multiple sites: Secondary | ICD-10-CM

## 2021-12-19 DIAGNOSIS — C4361 Malignant melanoma of right upper limb, including shoulder: Secondary | ICD-10-CM

## 2021-12-19 DIAGNOSIS — E1122 Type 2 diabetes mellitus with diabetic chronic kidney disease: Secondary | ICD-10-CM

## 2021-12-19 DIAGNOSIS — C50411 Malignant neoplasm of upper-outer quadrant of right female breast: Secondary | ICD-10-CM

## 2021-12-19 DIAGNOSIS — Z171 Estrogen receptor negative status [ER-]: Secondary | ICD-10-CM

## 2021-12-19 DIAGNOSIS — E559 Vitamin D deficiency, unspecified: Secondary | ICD-10-CM

## 2021-12-19 DIAGNOSIS — N182 Chronic kidney disease, stage 2 (mild): Secondary | ICD-10-CM

## 2021-12-19 MED ORDER — TRULICITY 3 MG/0.5ML SC SOPN
PEN_INJECTOR | SUBCUTANEOUS | Status: AC
Start: 2021-11-30 — End: ?

## 2021-12-19 NOTE — Patient Instructions (Addendum)
 Orders Placed This Encounter   Procedures    X-RAY Dexa (Bone Density) Skeletal--- DUE NOW     Ferritin, Blood Green Plasma Separator Tube    Iron/IBC Panel    Vitamin B12, Blood Green Plasma Separator Tube    Reticulocyte Automated Lavender    CBC w/ Diff

## 2021-12-19 NOTE — Progress Notes (Signed)
 Tayshun Gappa [name], MD, [facility]  Health Sciences Clinical Professor  Division of Hematology/Oncology, Department of Medicine  Medical Director, [facility]  [facility]  [address], [address]  [place], [place] [address]  Tel: [phone]  Fax: [phone]                                        HEMATOLOGY ONCOLOGY FOLLOW UP NOTE     Date of Service:   08/31/2021      Kayla Joseph, female, DOB 1972/02/26    Referring Physician:  PCP: Darci Needle    Other Providers:    Dr. Elmon Kirschner    CHIEF COMPLAINT:   Right breast cancer    HISTORY OF THE PRESENT ILLNESS (HPI):     This is a 76 year-old premenopausal lady with metastatic breast cancer who completed all planned 4 treatments with the FEC-H regimen and there was a delay between the January and February treatment due to the abnormal LFTs after CyberKnife therapy to the liver but that resolved and was felt to be due to the Cyberknife therapy to the liver.  When we resumed the 4th treatment of the FEC regimen we gave the epirubicin with a 25% dose reduction. She is also continuing for metastatic bone lesions on Xgeva every 3 months and the last dose was on 01-08-2019 and due again in 03-2019.  She denies any dental issues. We never interrupted to therapy with Trastuzumab despite the liver function abnormality but changed it to every 3 weeks and she is tolerating it well.      Oncology Treatment  07/25/2021 08/17/2021   Day, Cycle  Day 1, Cycle 44 Day 1, Cycle 45   cycloPHOSPHAmide (CYTOXAN) IV      EPIrubicin (ELLENCE) IV      fluoroURACIL (ADRUCIL) IVP      trastuzumab-anns [facility]) IV  6 mg/kg 6 mg/kg     Oncology Treatment  03/09/2021 03/30/2021 04/20/2021 05/11/2021 06/01/2021 06/22/2021   Day, Cycle  Day 1, Cycle 38 Day 1, Cycle 39 Day 1, Cycle 40 Day 1, Cycle 41 Day 1, Cycle 42 Day 1, Cycle 43   cycloPHOSPHAmide (CYTOXAN) IV          EPIrubicin (ELLENCE) IV           fluoroURACIL (ADRUCIL) IVP          trastuzumab-anns [facility]) IV  6 mg/kg 6 mg/kg 6 mg/kg 6 mg/kg 6 mg/kg 6 mg/kg     Oncology Treatment  10/11/2020 11/03/2020 11/24/2020 12/15/2020 01/05/2021 01/26/2021 02/16/2021   Day, Cycle  Day 1, Cycle 31 Day 1, Cycle 32 Day 1, Cycle 33 Day 1, Cycle 34 Day 1, Cycle 35 Day 1, Cycle 36 Day 1, Cycle 37   cycloPHOSPHAmide (CYTOXAN) IV           EPIrubicin (ELLENCE) IV           fluoroURACIL (ADRUCIL) IVP           trastuzumab-anns [facility]) IV  6 mg/kg 420 mg 6 mg/kg 6 mg/kg 6 mg/kg 6 mg/kg 6 mg/kg     Oncology Treatment  12/31/2019 01/21/2020 02/11/2020 03/03/2020 03/24/2020 04/18/2020 05/12/2020 06/02/2020 06/23/2020 07/14/2020 08/04/2020   Day, Cycle  Day 1, Cycle 18 Day 1, Cycle 19 Day 1, Cycle 20 Day 1, Cycle 21

## 2022-06-06 LAB — CBC WITH DIFF, BLOOD
Abs Basophils: 11 cells/uL (ref 0–200)
Monocytes: 6.6 %
SEGS: 60.6 %

## 2022-06-06 LAB — COMPREHENSIVE METABOLIC PANEL, BLOOD
Albumin/Glob Ratio: 1.7 (calc) (ref 1.0–2.5)
Albumin: 4 g/dL (ref 3.6–5.1)
BUN/Creatinine Ratio: 23 (calc) — ABNORMAL HIGH (ref 6–22)
Bilirubin, Total: 0.7 mg/dL (ref 0.2–1.2)
Creatinine: 1.37 mg/dL — ABNORMAL HIGH (ref 0.60–1.00)

## 2022-06-06 LAB — VITAMIN D, 25-OH TOTAL: Vitamin D, 25-OH, Total: 34 ng/mL (ref 30–100)

## 2022-06-07 LAB — COMPREHENSIVE METABOLIC PANEL, BLOOD
ALT (SGPT): 9 U/L (ref 6–29)
AST (SGOT): 15 U/L (ref 10–35)
Alkaline Phos: 103 U/L (ref 37–153)
BUN: 31 mg/dL — ABNORMAL HIGH (ref 7–25)
Calcium: 8.2 mg/dL — ABNORMAL LOW (ref 8.6–10.4)
Carbon Dioxide: 19 mmol/L — ABNORMAL LOW (ref 20–32)
Chloride: 107 mmol/L (ref 98–110)
EGFR: 40 mL/min/{1.73_m2} — ABNORMAL LOW (ref >=60)
Globulin: 2.4 g/dL (calc) (ref 1.9–3.7)
Glucose: 162 mg/dL — ABNORMAL HIGH (ref 65–139)
Potassium: 4.8 mmol/L (ref 3.5–5.3)
Sodium: 136 mmol/L (ref 135–146)
Total Protein: 6.4 g/dL (ref 6.1–8.1)

## 2022-06-07 LAB — CBC WITH DIFF, BLOOD
Abs Eosinophils: 11 {cells}/uL — ABNORMAL LOW (ref 15–500)
Abs Lymphs: 1814 {cells}/uL (ref 850–3900)
Abs Monocytes: 370 {cells}/uL (ref 200–950)
Abs Neutrophils: 3394 {cells}/uL (ref 1500–7800)
Basophils: 0.2 %
Eosinophils: 0.2 %
HCT: 30.3 % — ABNORMAL LOW (ref 35.0–45.0)
HGB: 9.9 g/dL — ABNORMAL LOW (ref 11.7–15.5)
Lymps: 32.4 %
MCH: 29.6 pg (ref 27.0–33.0)
MCHC: 32.7 g/dL (ref 32.0–36.0)
MCV: 90.4 fL (ref 80.0–100.0)
MPV: 10 fL (ref 7.5–12.5)
PLT: 172 10*3/uL (ref 140–400)
RBC: 3.35 10*6/uL — ABNORMAL LOW (ref 3.80–5.10)
RDW: 12.3 % (ref 11.0–15.0)
WBC: 5.6 10*3/uL (ref 3.8–10.8)

## 2022-06-07 LAB — ~~LOC~~ 27.29, BLOOD: ~~LOC~~ 27.29: 10 U/mL (ref <38)

## 2022-06-07 LAB — RETICULOCYTES AUTOMATED, BLOOD
Retic %, Auto: 1.1 %
Retic Absolute: 36850 {cells}/uL (ref 20000–80000)

## 2022-06-07 LAB — VITAMIN B12, BLOOD: Vitamin B12: 240 pg/mL (ref 200–1100)

## 2022-06-07 LAB — FERRITIN, BLOOD: Ferritin: 7 ng/mL — ABNORMAL LOW (ref 16–288)

## 2022-06-11 ENCOUNTER — Ambulatory Visit (INDEPENDENT_AMBULATORY_CARE_PROVIDER_SITE_OTHER): Payer: Medicare Other | Admitting: Hematology & Oncology

## 2022-06-11 DIAGNOSIS — Z171 Estrogen receptor negative status [ER-]: Secondary | ICD-10-CM

## 2022-06-11 DIAGNOSIS — D508 Other iron deficiency anemias: Secondary | ICD-10-CM

## 2022-06-11 DIAGNOSIS — N1831 Chronic kidney disease, stage 3a (CMS-HCC): Secondary | ICD-10-CM

## 2022-06-11 DIAGNOSIS — M810 Age-related osteoporosis without current pathological fracture: Secondary | ICD-10-CM

## 2022-06-11 DIAGNOSIS — E538 Deficiency of other specified B group vitamins: Secondary | ICD-10-CM

## 2022-06-11 DIAGNOSIS — Z780 Asymptomatic menopausal state: Secondary | ICD-10-CM

## 2022-06-11 DIAGNOSIS — C50411 Malignant neoplasm of upper-outer quadrant of right female breast: Secondary | ICD-10-CM

## 2022-06-11 MED ORDER — LEVOTHYROXINE SODIUM 100 MCG OR TABS
100.0000 ug | ORAL_TABLET | Freq: Every day | ORAL | Status: AC
Start: 2022-05-18 — End: ?

## 2022-06-11 NOTE — Progress Notes (Signed)
Kayla Joseph A. Kayla Singh, MD, [facility]  Health Sciences Clinical Professor  Division of Hematology/Oncology, Department of Medicine  Medical Director, [place]  [address], [place] [address]  Tel: [phone]  Fax: [phone]                                        HEMATOLOGY ONCOLOGY FOLLOW UP NOTE     Date of Service:   06/11/2022        Kayla Joseph, DOB Jun 04, 1946    REFERRING MD: Kayla Joseph  Radiation oncology: Kayla Joseph  PCP: Kayla Joseph  GYN: Kayla Joseph   Cardiology: Kayla Joseph    CHIEF COMPLAINT: Routine follow-up visit for right breast cancer and melanoma    HISTORY OF PRESENT ILLNESS:    76 y/o Caucasian woman with mammographically detected clinical stage IIA T2N0 right breast invasive ductal carcinoma s/p bx  01/14/14 (SBR 9/9, ER/PR negative, Ki-67=50%, Her-2/neu FISH ratio=1.14 but copy number=6.6 so positive). MRI breast 01/29/14 showed mass to be 2.2 x 1.6 x 2.0 cm. Staging PET/CT 01/27/14 was negative for distant mets; showed only known right breast tumor and incidental left thyroid nodule. Planned neoadjuvant systemic therapy: TCH + P x 6 cycles followed by surgery and radiation therapy. Cycle #1 TCH+P given 02/17/14. Pt developed severe diarrhea and dehydration causing electrolyte derangements requiring daily replacement therapies and hydration.  Therefore, chemotherapy was changed to weekly Taxol for 12 weeks and Herceptin plus Perjeta every 3 weeks was continued. Taxol started 03/10/14 and completed only 8 weeks on 04/28/14 due to cumulative severe side effects and MRI breast 04/30/14 showing only 47m of residual enhancement. Herceptin/Perjeta continued q3wks for 6 total cycles. Right lumpectomy + SLNB done 05/28/14 (6 mm residual IDC, grade 2, SBR 7/9, no LI, 0/1 LN; ypT1bN0; residual tumor had lower a mitotic score compared to initial biopsy specimen likely  due to chemotherapy effect; repeat ER and PR on residual tumor was again negative for both). Due to residual disease although minimal, pt received final dose (#6)  of Perjeta 06/02/14. Herceptin single-agent q3wks started 06/23/14.     July 14, 2014 (Herceptin week #22)     Started single-agent Herceptin 06/23/14 and tolerated it well without side effects. Hair is starting to grow.  Neuropathy in the fingers and bottoms of the feet continue but improve when she takes gabapentin 300 mg 3 times a day.  She does not want to increase the dose since it is controlling her neuropathy and she does not want to increase fatigue due to medication side effect.  [place] but it was [place]  The only difference during that trip was that her friends were cooking all of her meals for her so a change in diet seems to have improved the diarrhea.  Since her return, her diarrhea has resumed unchanged.  She has about 6 watery episodes of diarrhea per day and has had 3 episodes thus far this morning.  She continues Percocet as needed but also has a prior prescription for more tincture of opium which she will change back to since postop pain has resolved.  She has not yet done repeat stool studies since she was on her  trip but will do so soon with prior order.  She has not seen the gastroenterologist yet but will ask her primary care doctor for a referral to see one soon.  Dysuria symptoms recurred this past weekend.  Bactrim worked well in the past after the 1st dose. She saw Dr Kayla Joseph 06/25/14 and will start xrt next week on 07/21/14.  She receives hydration every 3 weeks with Herceptin but requests more frequent visits since her diarrhea is ongoing. Labs (CBC, CMP) 06/02/14 sig for H/H=10.6/32.9; gluc=221.     The patient completed the Herceptin every 3 weeks for total of 1 year of adjuvant therapy in 12-2014 and remains off  therapy.     INTERVAL HISTORY:    The patient is due for a repeat mammogram. She reports discomfort in the right breast. She is here to review the lab work and DEXA.   She reports that she had a negative colonoscopy in 2022 and 2 polyps were removed.  She is followed by Dr. Dawayne Joseph of GI.       11-20-2019 1st vaccine [place] [facility]  12-11-2019 2nd vaccine [place] at [place]:      Reviewed and no changes.    Stage IIA T2N0 right breast IDC --> ypT1bN0   LUE DVT dx'ed by U/S 02/10/14 -- Lovenox started same day; resolved on 05/18/14 U/S; completed Lovenox 06/07/14   Stage I pT1aN0 melanoma of upper back s/p excision  DM II  Hyperlipidemia  Hypertension  BCC skin  Thyroid nodules bilaterally   She had a colonoscopy in 2018 and to be repeated in 2023.               PAST SURGICAL HISTORY:      Reviewed and no changes.    05/28/14        Right lumpectomy + SLNB   02/05/14      L. chest port placement ([facility])  07/04/11      Wide excision upper back melanoma + R. supraclavicular SLNB (lentigo melanoma and melanoma in situ, 0.75 mm, no residual invasive melanoma, 0/1 LN)   2013         Facelift  2012         Gastric bypass  1999         Cholecystectomy  1995         Left foot fracture repair with pins  Age 76      Tonsillectomy        PAST OB/GYN HISTORY:      Reviewed and no changes.      G0P0. Menarche at 76. OCP x 15 yrs; last age 59s. HRT late 59s for <3mhs; stopped after [facility] study results reported. LMP age 76        HEALTH MAINTENANCE:    Colo -- 3-4 yrs ago; repeat due next year   DXA -- 2015 osteopenia; prescribed Fosamax but never started   Pap -- 11/2013 (one prior abnl pap and polyp removed)        FAMILY HISTORY:    Reviewed and no changes.    Mother -- died 867of CHF  Father -- died 826of decline after stroke  Sister -- died 432of NHL; another sister alive and well     SOCIAL HISTORY:                Reviewed and no changes  Lives with partner, Kayla Joseph.   Kids --  none   Retired Games developer.   Tobacco -- only 4 yrs in early 34s,  <1ppd  Alcohol - 1 glass wine/mth at most   Drugs - none          CURRENT MEDS:    I reviewed the medication list with her:    ALLERGIES:     No Known Allergies    REVIEW OF SYSTEMS (ROS):    A comprehensive 15-point ROS was performed and reviewed with the patient and is negative unless noted above.       EXAM:        06/22/2020     9:09 AM 12/20/2020     9:12 AM 12/20/2020     9:15 AM 06/20/2021     8:37 AM 06/20/2021     8:39 AM 12/19/2021     8:34 AM 12/19/2021     8:37 AM   Vitals   Taken/Reported  In Clinic  In Clinic  In Clinic    Systolic  542  706  237    Diastolic  75  60  72    Position  Sitting  Sitting  Sitting    Site  Left arm  Right arm  Right arm    Cuff Size  Regular    Regular    Pulse  84  82  114    Resp  18  18  18     Temp  96.6 F (35.9 C)  97.1 F (36.2 C)  97.5 F (36.4 C)    Temp Source  Temporal  Temporal  Temporal    Weight (kg)  88.451 kg  86.5 kg  83.35 kg    Weight (lbs)  195 lb  190 lb 11.2 oz  183 lb 12.1 oz    Height (cm)  165.1 cm  165.1 cm  165.1 cm    Height (ft in)  5' 5"   5' 5"   5' 5"     BMI (kg/m2)  32.45 kg/m2  31.73 kg/m2  30.58 kg/m2    BSA (m2)  2.01 m2  1.99 m2  1.96 m2    Fall Risk No fall risk identified  No fall risk identified  No fall risk identified  No fall risk identified        GENERAL: The patient is well developed and well nourished, ambulatory, in no acute distress.  HEENT: normocephalic/atraumatic, anicteric sclera.  HEART: RRR. No murmurs.  CHEST: Clear to auscultation bilaterally.  AXILLAE: No palpable lesions bilaterally.  BREASTS:  Bilaterally examined and no new concerning findings.  ABDOMEN: soft, non-tender, non-distended, normal bowel sounds, no rebound or guarding, no appreciable masses  EXTREMITIES: No edema of the lower extremities.  SKIN:  No rash or jaundice.  NEURO: AOx3, EOMI, no focal deficits, gait steady.  PSYCHIATRIC:  Good insight adequate mood and affect.    LAB  DATA:    I reviewed the lab work with her:     Latest Reference Range & Units 06/03/19 10:14 12/11/19 11:02 06/15/20 11:01 12/14/20 09:25 06/16/21 08:21 06/06/22 10:44 06/06/22 10:46   Sodium 135 - 146 mmol/L 136  139 134 (L) 136 136    Potassium 3.5 - 5.3 mmol/L   5.2 5.2 5.0 4.8    Potassium 3.4 - 4.8 mmol/L 4.7         Chloride 98 - 110 mmol/L 104  108 105 105 107    Carbon Dioxide 20 - 32  mmol/L 20  22 19  (L) 23 19 (L)    BUN 7 - 25 mg/dL 19  25 25 25 31  (H)    Creatinine 0.60 - 1.00 mg/dL 1.10 (H)  1.17 (H) 1.19 (H) 1.28 (H) 1.37 (H)    eGFR non-Afr.American > OR = 60 mL/min/1.47m 49 (L)  45 (L) 44 (L)      eGFR African American > OR = 60 mL/min/1.753m57 (L)  52 (L) 51 (L)      Glucose 65 - 139 mg/dL 156 (H)  98 126 (H) 119 (H) 162 (H)    Calcium 8.6 - 10.4 mg/dL 8.7  8.7 8.9 8.8 8.2 (L)    Protein, Total 6.4 - 8.4 g/dL 7.2         Total Protein 6.1 - 8.1 g/dL   6.6 6.6 6.5 6.4    Alkaline Phos 37 - 153 U/L 117  96 96 102 103    AST (SGOT) 10 - 35 U/L 14  12 12 12 15     ALT (SGPT) 6 - 29 U/L 9  6 7 6 9     Albumin 3.6 - 5.1 g/dL 4.1  3.9 4.2 4.0 4.0    Bilirubin, Total 0.2 - 1.2 mg/dL 0.9  1.3 (H) 1.3 (H) 1.0 0.7    BUN/Creatinine Ratio 6 - 22 (calc) 17  21 21 20 23  (H)    Woodson 27.29 <38 U/mL 20  17 15 15 10     Ferritin 16 - 288 ng/mL  25 16   7  (L)    Globulin 1.9 - 3.7 g/dL (calc)   2.7 2.4 2.5 2.4    Globulin 2.2 - 4.0 g/dL (calc) 3.1         Albumin/Glob Ratio 1.0 - 2.5 (calc)   1.4 1.8 1.6 1.7    Albumin/Globulin Ratio 0.9 - 2.3 (calc) 1.3         Iron 45 - 160 mcg/dL  118 131       IIBC 250 - 450 mcg/dL (calc)  349 380       Iron Saturation 16 - 45 % (calc)  34 34       Vitamin B12 200 - 1,100 pg/mL      240    Vitamin D, 25-OH, Total 30 - 100 ng/mL 48  35 37 51  34   WBC 3.8 - 10.8 Thousand/uL 7.3  5.8 5.6 7.0 5.6    RBC 3.80 - 5.10 Million/uL 4.08  3.67 (L) 3.71 (L) 3.65 (L) 3.35 (L)    HGB 11.7 - 15.5 g/dL 12.5  11.1 (L) 11.1 (L) 11.3 (L) 9.9 (L)    HCT 35.0 - 45.0 % 37.1  33.5 (L) 33.8 (L) 34.4 (L)  30.3 (L)    MCV 80.0 - 100.0 fL 90.9  91.3 91.1 94.2 90.4    MCH 27.0 - 33.0 pg 30.6  30.2 29.9 31.0 29.6    MCHC 32.0 - 36.0 g/dL 33.7  33.1 32.8 32.8 32.7    RDW 11.0 - 15.0 % 12.0  11.9 12.0 11.8 12.3    PLT 140 - 400 Thousand/uL 217  193 234 195 172    MPV 7.5 - 12.5 fL 10.1  10.0 10.3 9.9 10.0    SEGS % 63.6  54.8 61.6 59.3 60.6    Lymps % 28.4  37.1 30.1 31.0 32.4    Monocytes % 7.3  7.4 7.7 9.0 6.6    Eosinophils % 0.6  0.5 0.4 0.4 0.2  Basophils % 0.1  0.2 0.2 0.3 0.2    BANDS % CANCELED  CANCELED CANCELED CANCELED NOT AVAILABLE    Metamyelocytes % CANCELED  CANCELED CANCELED CANCELED NOT AVAILABLE    Myelocytes % CANCELED  CANCELED CANCELED CANCELED NOT AVAILABLE    Promyelocytes % CANCELED  CANCELED CANCELED CANCELED NOT AVAILABLE    Blasts % CANCELED  CANCELED CANCELED CANCELED NOT AVAILABLE    Reactive Lymphs 0 - 10 % CANCELED  CANCELED CANCELED CANCELED NOT AVAILABLE    NRBC 0 /100 WBC CANCELED  CANCELED CANCELED CANCELED NOT AVAILABLE    Abs Neutrophils 1,500 - 7,800 cells/uL 4,643  3,178 3,450 4,151 3,394    Abs Lymphs 850 - 3,900 cells/uL 2,073  2,152 1,686 2,170 1,814    Abs Monocytes 200 - 950 cells/uL 533  429 431 630 370    Abs Eosinophils 15 - 500 cells/uL 44  29 22 28 11  (L)    Abs Basophils 0 - 200 cells/uL 7  12 11 21 11     Abs Band Neutrophils 0 - 750 cells/uL CANCELED  CANCELED CANCELED CANCELED NOT AVAILABLE    Abs Metamyelocytes 0 cells/uL CANCELED  CANCELED CANCELED CANCELED NOT AVAILABLE    Abs Myelocytes 0 cells/uL CANCELED  CANCELED CANCELED CANCELED NOT AVAILABLE    Abs Promyelocytes 0 cells/uL CANCELED  CANCELED CANCELED CANCELED NOT AVAILABLE    Abs Blasts 0 cells/uL CANCELED  CANCELED CANCELED CANCELED NOT AVAILABLE    Abs NRBC 0 cells/uL 0  0 CANCELED CANCELED NOT AVAILABLE    Comments  CANCELED  CANCELED CANCELED CANCELED NOT AVAILABLE    Retic %, Auto %      1.1    Retic Absolute 20,000 - 80,000 cells/uL      36,850    (L): Data is abnormally low  (H): Data is abnormally  high    IMAGING DATA:    Reviewed the imaging with patient:    06-08-2021 Mammogram showed IMPRESSION:   Stable post-lumpectomy scar in the right breast is benign. Note that distortion from scarring can obscure underlying lesions.     Suggest this patient return for her routine annual screening mammogram in 1 year.     ACR BI-RADS Category 2 -  Benign Finding.     02-25-2020 MRI right knee showed IMPRESSION:     1.  Attenuated posterior root of the lateral meniscus; query small tear.   2.  Focal low-grade chondromalacia with questionable chondral delamination, measuring 0.6 cm in the weightbearing lateral femoral condyle.   3.  Small Baker's cyst.     12-10-2019 DEXA showed osteoporosis is at T score -2.8.     07-01-2019 MRI of breasts showed     Result Impression   IMPRESSION:    1. Right breast: BI-RADS Category 4 - Suspicious. Recommend US-guided core needle biopsy lumpectomy scar at 11:00 location given nodular plateau enhancement seen at the superior aspect of the scar. Findings and recommendations discussed with the patient on 07/01/19 at 12:30 pm.    2. Left breast: BI-RADS Category 1 - Negative.    OVERALL ASSESSMENT: BI-RADS Category 4.     05-26-2019 Mammogram showed   Result Impression     Area of distortion consistent with post surgical scarring in the left breast is benign. The findings and recommendations were discussed with the patient by Dr. Ricki Miller at the conclusion of this examination.    Suggest this patient return for her routine annual screening mammogram in 1 year.    ACR  BI-RADS Category 2 -  Benign Finding.     06-09-2018 Mammogram showed    Result Impression     Stable post lumpectomy scarring and radiation therapy change in the right breast is benign. Note that distortion from scarring can obscure underlying lesions.    Suggest this patient return for her routine annual screening mammogram in 1 year.    ACR BI-RADS Category 2 -  Benign Finding.     June 03, 2017 bone density shows  osteopenia T-score of -2.4 in the left femoral neck.    06-03-2017 Mammogram showed Stable post lumpectomy scarring and radiation therapy change in the right breast is benign.    Suggest this patient return for her routine annual screening mammogram in 1 year.    ACR BI-RADS Category 2 -  Benign Finding.    05/24/2016 bilateral diagnostic 2D and 3D mammogram shows stable post lumpectomy changes in the right breast are benign.  December 27, 2015 echocardiogram shows a normal ejection fraction.  05-24-2015 bilateral diagnostic mammogram and bilateral breast ultrasound showed new post lumpectomy scar in the right breast is benign, stable punctate calcifications in the area areolar region of the right breast are benign, stable punctate calcifications in the left breast at 6 o'clock are benign with no evidence of malignancy.  11/22/2014 bilateral 2D and 3D mammogram shows new punctate calcifications in the periareolar region of the right breast are probably benign in mammographic follow-up in 6 months is recommended, new post lumpectomy scar in the right breast is benign. New punctate calcifications in the left breast at 6 o'clock up probably benign.  11/22/2014 bone density shows osteoporosis with a T-score of-2.6 in the right femoral neck.  August 31, 2014 echocardiogram shows left ventricular diastolic dysfunction, normal left ventricular size and systolic function.    PATHOLOGY:    I reviewed the report with her:    07/07/2019 RIGHT BREAST, 11:00 AT 7 CM, CORE BIOPSY:  Fat necrosis and scar, consistent with prior procedure site changes.  No carcinoma identified.    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  09/27/2014 left thyroid nodule atypical follicular lesion of undetermined significance, a right thyroid nodule fine-needle aspiration negative for  malignancy.    IMPRESSION/PLAN:    Problem # 1:  BREAST CANCER      Initial clinical stage IIA pT2N0 right IDC s/p bx 01/14/14 (ER/PR negative, Her-2 positive by copy number = 6.6, Ki-67=50%). MRI breasts 01/29/14 showed lesion to be 2.2 x 1.6 x 2.0 cm. Staging PET/CT negative for distant mets. Neoadjuvant chemotherapy with Her-2 targeted therapy was suggested, since tumor size > 2 cm, so recommended TCH + P (Taxotere, Carboplatin, Herceptin, Perjeta) q3wks x 6 cycles. Systemic therapies started 02/17/14 but pt developed severe diarrhea causing dehydration and electrolyte abnormalities requiring daily hydration and electrolyte replacements so changed regimen to weekly Taxol x 12 weeks with Herceptin/Perjeta q3wks and tolerated only slightly better. Right breast mass resolved on physical exam. Pt was becoming weaker due to ongoing diarrhea on Taxol so discontinued after 04/28/14 dose with plan for repeat breast imaging followed by surgery if response was good.  Breast imaging 04/28/14 (mammo, u/s) and MRI 04/30/14 showed minimal residual disease so Taxol was discontinued after 04/28/14 dose (week #8) and Herceptin/Perjeta continued. Right lumpectomy + SLNB 05/28/14 showed 6 mm residual disease, grade 2, SBR=7/9 (lower mitotic rate than bx), 0/1  LN; repeat ER/PR negative. Since only 5 doses of Perjeta were given pre-operatively and there was residual disease in surgical specimen, she was given the final 6th dose of Perjeta 06/02/14 with usual q3wk dose of Herceptin. Single-agent Herceptin continued q3wk and completed a 1-yr course in 12-2014 and since then no therapy. The radiation therapy was completed. Clinically no new findings in the most recent was reviewed a repeat breast MRI was ordered.  The repeat breast MRI showed a BI-RADS category 4 in the right breast for which she under went a biopsy and it confirmed only fat necrosis. No new concerns on today's breast exam but she is due for a repeat mammogram and this was ordered.  Will continue to monitor. All of her questions were answered to her satisfaction.    Problem # 2:  ACUTE DVT OF UPPER EXTREMITY      LUE acute DVT dx'ed by u/s 02/10/14 due to sudden onset of left arm swelling, completed anticoagulation and no evidence of recurrent thrombosis. May consider to remove PORT was concerned about chance to develop DVT but advised that removing the PORT does not usually cause a DVT.    Problem # 3:  ABNORMAL ECHO      Baseline ECHO 02/10/14 with normal EF of 65-70% but with mild diastolic dysfunction. I advised her to continue  f/u with Dr. Reubin Milan for monitoring of cardiac function and recommended again a repeat ECHO and will retrieve the report from 2019.    Problem # 4: THYROID NODULE             She had a total thyroidectomy and no malignancy found.  Follow-up with Endocrinology or now PCP.    PROBLEM # 5 OSTEOPOROSIS    Repeat bone density was reviewed now she had osteopenia and stopped the Fosamax and also vitamin-D and calcium. Repeat DEXA reviewed and needs to continue to do weight bearing exercises and fall precautions and Vit D and Calcium. Discussed options such as the oral biphosphonates or Reklast/Prolia.     PROBLEM # 6 MELANOMA    No new reported skin lesions. Recommend monitoring with dermatology.    Continued progressive elevation of the creatinine and recommend nephrology evaluation and send a referreal. Encouraged oral hydration. Her H/H is likely due to that but cannot exclude occult GI bleeding. May require Procrit if progressive anemia.    NCCN COVID-19 Vaccination   Guide for People with Cancer    In the general population, people who are vaccinated are less likely to become sick with COVID-19. Also,vaccinated people who do get COVID-19 are much less likely to become seriously ill. However, many people with cancer have a higher risk of serious COVID-19 illness because they're immunocompromised.     An expert panel of doctors from the [employer] (NCCN) recommends people with cancer get fully vaccinated.  People with cancer should still wear masks, avoid crowds, and keep social distancing   even after getting vaccinated. So should their caregivers, family, and close contacts.   Caregivers, family, and close contacts should get all their vaccines and boosters, too.  In addition to vaccines, a combination of monoclonal antibody drugs (Evusheld)   can help prevent QBHAL-93 in certain patients with weakened immune systems    Adapted from [employer]Credentials.be.pdf?sfvrsn=45cc3047_2    Adapted from http://www.ward.com/.html#immunocompromised    Recommend to follow the COVID-19 vaccination schedule for people who are moderately or severely immunocompromised.    Adapted from the CDC "Symptoms and Caring for  Yourself at Home"    Discussed signs and symptoms of COVID19 and advised to immediately contact us for any new problems. Pros and cons of vaccination were reviewed and completed the 3rd booster. Do not recommend the 4th dose.    Reported illnesses have ranged from mild symptoms to severe illness and death for confirmed coronavirus disease 2019 (COVID-19) cases.    These symptoms may appear 2-14 days after exposure (based on the incubation period of MERS-CoV viruses).    Coronavirus symptoms include, but are not limited to:    Fever or chills  Cough  Shortness of breath or difficulty breathing  Fatigue  Muscle or body aches  Headache  New loss of taste or smell  Sore throat  Congestion or runny nose  Nausea or vomiting  Diarrhea    If you have possible or confirmed COVID-19:  Stay home from work, school, and away from other public places.   If you must go out, avoid using any kind of public transportation, ridesharing, or taxis.   Monitor your symptoms carefully.   If your symptoms get worse, call your healthcare provider  immediately.   Get rest and stay hydrated. If you have a medical appointment, call the healthcare provider ahead of time and tell them that you have or may have COVID-19.   For medical emergencies, call 911 and notify the dispatch personnel that you have or may have COVID-19.   Cover your cough and sneezes.   Wash your hands often with soap and water for at least 20 seconds or clean your hands with an alcohol-based hand sanitizer that contains at least 60% alcohol.   As much as possible, stay in a specific room and away from other people in your home.   Also, you should use a separate bathroom, if available.   If you need to be around other people in or outside of the home, wear a facemask.   Avoid sharing personal items with other people in your household, like dishes, towels, and bedding.   Clean all surfaces that are touched often, like counters, tabletops, and doorknobs. Use household cleaning sprays or wipes according to the label instructions.    If you develop emergency warning signs for COVID-19 get medical attention immediately. Emergency warning signs include*:  Trouble breathing   Persistent pain or pressure in the chest   New confusion or inability to arouse   Bluish lips or face  *This list is not all inclusive. Please consult your medical provider for any other symptoms that are severe or concerning.    There is no specific antiviral treatment recommended for COVID-19. People with COVID-19 should receive supportive care to help relieve symptoms. For severe cases, treatment should include care to support vital organ functions.    Preliminary evidence suggests that COVID-19 not only detrimentally affects respiratory organs, but also can result in "heart inflammation, acute kidney disease, neurological malfunction, blood clots, intestinal damage and liver problems."    People who think they may have been exposed to COVID-19 should contact their healthcare provider immediately.    PLAN:    1.  Completed  single-agent Herceptin q3wks to complete 1 yr total course (06/23/14 thru 01/19/15).  Repeat mammogram ordered and reviewed and no new concerns. Continue observation.  2.  Anemia remains mild and creatinine not worsened. For now continue observation.   3.  ECHO to be repeated as per Dr. Reubin Milan of cardiology.    4. Thyroidectomy done due to atypical left thyroid nodule  and no malignancy, follow-up with Endocrinology.  5. Renal insufficiency and diabetes may contribute to anemia but the anemia is not worsened for now.  6. History of melanoma and she is followed by Dermatology. Reports no new concerns.    Orders Placed This Encounter   Procedures    Diagnostic Mammogram With Digital Breast Tomosynthesis - Bilateral    CBC w/ Diff Lavender    [place] 27.[facility], BLOOD    Comprehensive Metabolic Panel    Iron, TIBC and Ferritin Panel - [facility]    Vitamin B12, Blood Green Plasma Separator Tube    Reticulocyte Automated Lavender       Thank you very much for allowing our continued participation in the care of this patient.     Medical decision-making was highly complex  and more than 50% of this _40_ min visit was spent in educating the patient about their condition, discussing compliance issues, counseling including answering all of the patients questions and coordination of care. This included review of relevant laboratory, radiology and other diagnostic tests, explaining medical management choices and the patient verbalized understanding.                                                                                                                                                       RTO:    6 months    I certify that I have reviewed the documentation contained in this clinical record and that it is accurately recorded.  This document contains private and confidential health information protected by state and federal law and any release of this information requires the written prior authorization of the above mentioned  patient.    Please note this report was dictated with the use of voice recognition software.  It may contain inadvertent spelling or grammatical errors which were not detected during the editing process.       Take a virtual tour of our facility: [email or web address]          --------------------------------------  [name], MD, MBA

## 2022-06-11 NOTE — Patient Instructions (Addendum)
Orders Placed This Encounter   Procedures    Diagnostic Mammogram With Digital Breast Tomosynthesis - Bilateral    US Breast Complete - Bilateral    CBC w/ Diff Lavender    [place] 27.29, BLOOD    Comprehensive Metabolic Panel    Iron, TIBC and Ferritin Panel - [facility]    Vitamin B12, Blood Green Plasma Separator Tube    Reticulocyte Automated Lavender    Consult/Referral to Nephrology       Huey Romans, M.D.  Nephrology    Primary Address  [place],  [place] [address]  [phone]

## 2022-11-02 ENCOUNTER — Telehealth: Payer: Self-pay | Admitting: Hematology & Oncology

## 2022-11-02 NOTE — Telephone Encounter (Signed)
11/02/22 3:00--I contacted patient and confirmed lab results to be faxed date 06/06/22; lab results electronically e faxed to Dr. Mariel Kansky.      --Zachery Dauer RN

## 2022-11-02 NOTE — Telephone Encounter (Signed)
patient is calling requesting to have her recent blood results to be faxed over to her PCP.  Fax#  [phone]    Please assist thank you.

## 2022-12-05 ENCOUNTER — Telehealth: Payer: Self-pay | Admitting: Hematology & Oncology

## 2022-12-05 NOTE — Telephone Encounter (Signed)
Returned pt call, pt inquiring about orders wants to clarify what she is to have done.  Informed her that Dr. Dorris Singh ordered Breast US and mammogram. Pt requests for orders to be re-faxed.  Orders faxed to [phone].

## 2022-12-05 NOTE — Telephone Encounter (Signed)
Patient requesting to speak to MD/Nurse regarding wanting to confirm what the 2nd mammogram order.     Please assist.

## 2022-12-12 ENCOUNTER — Ambulatory Visit: Payer: Medicare Other | Admitting: Hematology & Oncology

## 2022-12-14 ENCOUNTER — Telehealth: Payer: Self-pay | Admitting: Hematology & Oncology

## 2022-12-14 DIAGNOSIS — M810 Age-related osteoporosis without current pathological fracture: Secondary | ICD-10-CM

## 2022-12-14 DIAGNOSIS — D508 Other iron deficiency anemias: Secondary | ICD-10-CM

## 2022-12-14 DIAGNOSIS — N1831 Chronic kidney disease, stage 3a (CMS-HCC): Secondary | ICD-10-CM

## 2022-12-14 DIAGNOSIS — C50411 Malignant neoplasm of upper-outer quadrant of right female breast: Secondary | ICD-10-CM

## 2022-12-14 DIAGNOSIS — Z780 Asymptomatic menopausal state: Secondary | ICD-10-CM

## 2022-12-14 DIAGNOSIS — E538 Deficiency of other specified B group vitamins: Secondary | ICD-10-CM

## 2022-12-14 NOTE — Telephone Encounter (Signed)
Patient called states she is going [facility] for blood works and is requesting new orders. Please assist.

## 2022-12-14 NOTE — Telephone Encounter (Signed)
Reached out to patient, informed her that new lab orders placed for [facility], pt verbalizes understanding.

## 2022-12-19 ENCOUNTER — Telehealth: Payer: Self-pay

## 2022-12-19 LAB — IRON, TIBC AND FERRITIN PANEL - QUEST
Ferritin: 4 ng/mL — ABNORMAL LOW (ref 16–288)
IIBC: 486 mcg/dL (calc) — ABNORMAL HIGH (ref 250–450)
Iron Saturation: 12 % (calc) — ABNORMAL LOW (ref 16–45)
Iron: 60 ug/dL (ref 45–160)

## 2022-12-19 LAB — ~~LOC~~ 27.29, BLOOD: ~~LOC~~ 27.29: <10 U/mL (ref <38)

## 2022-12-19 LAB — RETICULOCYTES AUTOMATED, BLOOD
Retic %, Auto: 1 %
Retic Absolute: 33800 cells/uL (ref 20000–80000)

## 2022-12-19 LAB — COMPREHENSIVE METABOLIC PANEL, BLOOD
ALT (SGPT): 7 U/L (ref 6–29)
AST (SGOT): 13 U/L (ref 10–35)
Albumin/Glob Ratio: 1.7 (calc) (ref 1.0–2.5)
Albumin: 4.3 g/dL (ref 3.6–5.1)
Alkaline Phos: 96 U/L (ref 37–153)
BUN/Creatinine Ratio: 20 (calc) (ref 6–22)
BUN: 28 mg/dL — ABNORMAL HIGH (ref 7–25)
Bilirubin, Total: 0.8 mg/dL (ref 0.2–1.2)
Calcium: 8.7 mg/dL (ref 8.6–10.4)
Carbon Dioxide: 20 mmol/L (ref 20–32)
Chloride: 106 mmol/L (ref 98–110)
Creatinine: 1.4 mg/dL — ABNORMAL HIGH (ref 0.60–1.00)
EGFR: 39 mL/min/{1.73_m2} — ABNORMAL LOW (ref >=60)
Globulin: 2.5 g/dL (calc) (ref 1.9–3.7)
Glucose: 92 mg/dL (ref 65–99)
Potassium: 4.3 mmol/L (ref 3.5–5.3)
Sodium: 136 mmol/L (ref 135–146)
Total Protein: 6.8 g/dL (ref 6.1–8.1)

## 2022-12-19 LAB — VITAMIN B12, BLOOD: Vitamin B12: 193 pg/mL — ABNORMAL LOW (ref 200–1100)

## 2022-12-19 NOTE — Telephone Encounter (Signed)
12/19/22 9:48--I contacted patient, informed her the Vitamin B12 lab drawn yesterday resulted low at 193, Dr. Dorris Singh has ordered Vitamin B12 oral supplement 1,027mg daily; patient stated she has taken the B12 supplement but not on a regular basis; I instructed patient to resume daily 1,0063m; patient verbalized understanding.      --L Zachery DauerN

## 2023-01-01 ENCOUNTER — Ambulatory Visit (INDEPENDENT_AMBULATORY_CARE_PROVIDER_SITE_OTHER): Payer: Medicare Other | Admitting: Hematology & Oncology

## 2023-01-01 VITALS — BP 125/74 | HR 101 | Temp 97.7°F | Resp 20 | Wt 176.7 lb

## 2023-01-01 DIAGNOSIS — C4361 Malignant melanoma of right upper limb, including shoulder: Secondary | ICD-10-CM

## 2023-01-01 DIAGNOSIS — C50411 Malignant neoplasm of upper-outer quadrant of right female breast: Secondary | ICD-10-CM

## 2023-01-01 DIAGNOSIS — Z171 Estrogen receptor negative status [ER-]: Secondary | ICD-10-CM

## 2023-01-01 DIAGNOSIS — M810 Age-related osteoporosis without current pathological fracture: Secondary | ICD-10-CM

## 2023-01-01 DIAGNOSIS — D508 Other iron deficiency anemias: Secondary | ICD-10-CM

## 2023-01-01 NOTE — Patient Instructions (Addendum)
Orders Placed This Encounter   Procedures    MRI BREAST BILATERAL W/WO CONTRAST    Iron, TIBC and Ferritin Panel - [facility]    CBC w/ Diff Lavender    Comprehensive Metabolic Panel    Vitamin B12, Blood Green Plasma Separator Tube    Reticulocyte Automated Lavender     Follow up with Dr Loman Chroman for GI work up .  Take vitron C for Anemia .   Take B12 1000 mcg daily   Hydrate 60-80 ounces Daily   Proceed with Breat MRI   Need Dental Clearance

## 2023-01-01 NOTE — Progress Notes (Signed)
Pratham Cassatt A. Dorris Singh, MD, [facility]  Health Sciences Clinical Professor  Division of Hematology/Oncology, Department of Medicine  Medical Director, [address]  [place], [place] [address]  Tel: [phone]  Fax: [phone]     HEMATOLOGY ONCOLOGY FOLLOW UP NOTE     Date of Service:   01/01/2023       Mosetta Pigeon, DOB Sep 30, 1946    REFERRING MD: Bonney Leitz  Radiation oncology: Marshall Cork  PCP: [name]  GYN: [name]   Cardiology: Gypsy Decant    CHIEF COMPLAINT: Routine follow-up visit for right breast cancer and melanoma    HISTORY OF PRESENT ILLNESS:    77 y/o Caucasian woman with mammographically detected clinical stage IIA T2N0 right breast invasive ductal carcinoma s/p bx  01/14/14 (SBR 9/9, ER/PR negative, Ki-67=50%, Her-2/neu FISH ratio=1.14 but copy number=6.6 so positive). MRI breast 01/29/14 showed mass to be 2.2 x 1.6 x 2.0 cm. Staging PET/CT 01/27/14 was negative for distant mets; showed only known right breast tumor and incidental left thyroid nodule. Planned neoadjuvant systemic therapy: TCH + P x 6 cycles followed by surgery and radiation therapy. Cycle #1 TCH+P given 02/17/14. Pt developed severe diarrhea and dehydration causing electrolyte derangements requiring daily replacement therapies and hydration.  Therefore, chemotherapy was changed to weekly Taxol for 12 weeks and Herceptin plus Perjeta every 3 weeks was continued. Taxol started 03/10/14 and completed only 8 weeks on 04/28/14 due to cumulative severe side effects and MRI breast 04/30/14 showing only 57m of residual enhancement. Herceptin/Perjeta continued q3wks for 6 total cycles. Right lumpectomy + SLNB done 05/28/14 (6 mm residual IDC, grade 2, SBR 7/9, no LI, 0/1 LN; ypT1bN0; residual tumor had lower a mitotic score compared to initial biopsy specimen likely due to chemotherapy effect;  repeat ER and PR on residual tumor was again negative for both). Due to residual disease although minimal, pt received final dose (#6)  of Perjeta 06/02/14. Herceptin single-agent q3wks started 06/23/14.     July 14, 2014 (Herceptin week #22)     Started single-agent Herceptin 06/23/14 and tolerated it well without side effects. Hair is starting to grow.  Neuropathy in the fingers and bottoms of the feet continue but improve when she takes gabapentin 300 mg 3 times a day.  She does not want to increase the dose since it is controlling her neuropathy and she does not want to increase fatigue due to medication side effect.  [place] just [place] Di[place] continues but it was improved with more solid form [place]  The only difference during that trip was that her friends were cooking all of her meals for her so a change in diet seems to have improved the diarrhea.  Since her return, her diarrhea has resumed unchanged.  She has about 6 watery episodes of diarrhea per day and has had 3 episodes thus far this morning.  She continues Percocet as needed but also has a prior prescription for more tincture of opium which she will change back to since postop pain has resolved.  She has not yet done repeat stool studies since she was on her trip but will do so soon with prior order.  She has not seen the gastroenterologist yet but will ask her primary care doctor for a referral to see one soon.  Dysuria symptoms recurred  this past weekend.  Bactrim worked well in the past after the 1st dose. She saw Dr Marshall Cork 06/25/14 and will start xrt next week on 07/21/14.  She receives hydration every 3 weeks with Herceptin but requests more frequent visits since her diarrhea is ongoing. Labs (CBC, CMP) 06/02/14 sig for H/H=10.6/32.9; gluc=221.     The patient completed the Herceptin every 3 weeks for total of 1 year of adjuvant therapy in 12-2014 and remains off therapy.     INTERVAL  HISTORY:    The patient is here to review the repeat imaging.  She continues to have discomfort in the right axilla.  She was recommended to have a breast MRI.  She is followed by Dr. Dawayne Patricia of GI and has completed colonoscopy but does not recall doing an upper GI endoscopy and capsule endoscopy.  She denies any visible bleeding.  She has been taking B12 but an interrupted it was found to be low on B12 again and now has resumed the oral B12 intake at 1000 mcg daily.  She stopped the Fosamax and is asking for a medication that is stronger.  She is followed by the dentist.  She is aware for borderline elevated creatinine in his monitored by PCP and is trying to stay well hydrated.    11-20-2019 1st vaccine [place] at [employer] [facility]  12-11-2019 2nd vaccine [place] at [place] HISTORY:      Reviewed and no changes.    Stage IIA T2N0 right breast IDC --> ypT1bN0   LUE DVT dx'ed by U/S 02/10/14 -- Lovenox started same day; resolved on 05/18/14 U/S; completed Lovenox 06/07/14   Stage I pT1aN0 melanoma of upper back s/p excision  DM II  Hyperlipidemia  Hypertension  BCC skin  Thyroid nodules bilaterally   She had a colonoscopy in 2018 and to be repeated in 2023.               PAST SURGICAL HISTORY:      Reviewed and no changes.    05/28/14        Right lumpectomy + SLNB   02/05/14      L. chest port placement ([facility])  07/04/11      Wide excision upper back melanoma + R. supraclavicular SLNB (lentigo melanoma and melanoma in situ, 0.75 mm, no residual invasive melanoma, 0/1 LN)   2013         Facelift  2012         Gastric bypass  1999         Cholecystectomy  1995         Left foot fracture repair with pins  Age 34      Tonsillectomy        PAST OB/GYN HISTORY:      Reviewed and no changes.      G0P0. Menarche at 51. OCP x 15 yrs; last age 36s. HRT late 26s for <34mhs; stopped after [facility] study results reported. LMP age 77        HEALTH MAINTENANCE:    Colo -- 3-4 yrs ago; repeat due next year   DXA --  2015 osteopenia; prescribed Fosamax but never started   Pap -- 11/2013 (one prior abnl pap and polyp removed)        FAMILY HISTORY:    Reviewed and no changes.    Mother -- died 826of CHF  Father -- died 818of decline  after stroke  Sister -- died 34 of NHL; another sister alive and well     SOCIAL HISTORY:                Reviewed and no changes    Lives with partner, [name].   Kids -- none   Retired Games developer.   Tobacco -- only 4 yrs in early 49s,  <1ppd  Alcohol - 1 glass wine/mth at most   Drugs - none          CURRENT MEDS:    I reviewed the medication list with her:    ALLERGIES:     No Known Allergies    REVIEW OF SYSTEMS (ROS):    A comprehensive 15-point ROS was performed and reviewed with the patient and is negative unless noted above.       EXAM:        12/20/2020     9:12 AM 12/20/2020     9:15 AM 06/20/2021     8:37 AM 06/20/2021     8:39 AM 12/19/2021     8:34 AM 12/19/2021     8:37 AM 01/01/2023     8:40 AM   Vitals   Taken/Reported In Clinic  In Clinic  In Clinic  In Clinic   Systolic [id number]  [id number]  [id number]     Diastolic 75  60  72     Position Sitting  Sitting  Sitting     Site Left arm  Right arm  Right arm     Cuff Size Regular    Regular     Pulse 84  82  114     Resp '18  18  18     '$ Temp 96.6 F (35.9 C)  97.1 F (36.2 C)  97.5 F (36.4 C)     Temp Source Temporal  Temporal  Temporal     Weight (kg) 88.451 kg  86.5 kg  83.35 kg     Weight (lbs) 195 lb  190 lb 11.2 oz  183 lb 12.1 oz     Height (cm) 165.1 cm  165.1 cm  165.1 cm     Height (ft in) '5\' 5"'$   '5\' 5"'$   '5\' 5"'$      BMI (kg/m2) 32.45 kg/m2  31.73 kg/m2  30.58 kg/m2     BSA (m2) 2.01 m2  1.99 m2  1.96 m2     Fall Risk  No fall risk identified  No fall risk identified  No fall risk identified         GENERAL: The patient is well developed and well nourished, ambulatory, in no acute distress.  HEENT: normocephalic/atraumatic, anicteric sclera.  HEART: RRR. No murmurs.  CHEST: Clear to auscultation bilaterally.  AXILLAE:  Palpable fullness in  the right axilla.  No suspicious findings in the left axilla.  BREASTS:  Bilaterally examined and no new concerning findings in either breast.  ABDOMEN: soft, non-tender, non-distended, normal bowel sounds, no rebound or guarding, no appreciable masses  EXTREMITIES: Trace edema of the lower extremities.  SKIN:  No rash or jaundice.  NEURO: AOx3,  no focal deficits.   PSYCHIATRIC:  Good insight adequate mood and affect.    LAB DATA:    I reviewed the lab work with her:     Latest Reference Range & Units 06/15/20 11:01 12/14/20 09:25 06/16/21 08:21 06/06/22 10:44 06/06/22 10:46 12/18/22 08:14   Sodium 135 - 146 mmol/L 139 134 (L) 136 136  136  Potassium 3.5 - 5.3 mmol/L 5.2 5.2 5.0 4.8  4.3   Chloride 98 - 110 mmol/L 108 105 105 107  106   Carbon Dioxide 20 - 32 mmol/L 22 19 (L) 23 19 (L)  20   BUN 7 - 25 mg/dL '25 25 25 31 '$ (H)  28 (H)   Creatinine 0.60 - 1.00 mg/dL 1.17 (H) 1.19 (H) 1.28 (H) 1.37 (H)  1.40 (H)   eGFR non-Afr.American > OR = 60 mL/min/1.1m 45 (L) 44 (L)       eGFR African American > OR = 60 mL/min/1.718m52 (L) 51 (L)       Glucose 65 - 99 mg/dL 98 126 (H) 119 (H) 162 (H)  92   Calcium 8.6 - 10.4 mg/dL 8.7 8.9 8.8 8.2 (L)  8.7   Total Protein 6.1 - 8.1 g/dL 6.6 6.6 6.5 6.4  6.8   Alkaline Phos 37 - 153 U/L 96 96 102 103  96   AST (SGOT) 10 - 35 U/L '12 12 12 15  13   '$ ALT (SGPT) 6 - 29 U/L '6 7 6 9  7   '$ Albumin 3.6 - 5.1 g/dL 3.9 4.2 4.0 4.0  4.3   Bilirubin, Total 0.2 - 1.2 mg/dL 1.3 (H) 1.3 (H) 1.0 0.7  0.8   BUN/Creatinine Ratio 6 - 22 (calc) '21 21 20 23 '$ (H)  20   [place] 27.29 <38 U/mL '17 15 15 10  '$ <10   Ferritin 16 - 288 ng/mL 16   7 (L)  4 (L)   Globulin 1.9 - 3.7 g/dL (calc) 2.7 2.4 2.5 2.4  2.5   Albumin/Glob Ratio 1.0 - 2.5 (calc) 1.4 1.8 1.6 1.7  1.7   Iron 45 - 160 mcg/dL 131     60   IIBC 250 - 450 mcg/dL (calc) 380     486 (H)   Iron Saturation 16 - 45 % (calc) 34     12 (L)   Vitamin B12 200 - 1,100 pg/mL    240  193 (L)   Vitamin D, 25-OH, Total 30 - 100 ng/mL 35 37 51  34    WBC 3.8 - 10.8  Thousand/uL 5.8 5.6 7.0 5.6     RBC 3.80 - 5.10 Million/uL 3.67 (L) 3.71 (L) 3.65 (L) 3.35 (L)     HGB 11.7 - 15.5 g/dL 11.1 (L) 11.1 (L) 11.3 (L) 9.9 (L)     HCT 35.0 - 45.0 % 33.5 (L) 33.8 (L) 34.4 (L) 30.3 (L)     MCV 80.0 - 100.0 fL 91.3 91.1 94.2 90.4     MCH 27.0 - 33.0 pg 30.2 29.9 31.0 29.6     MCHC 32.0 - 36.0 g/dL 33.1 32.8 32.8 32.7     RDW 11.0 - 15.0 % 11.9 12.0 11.8 12.3     PLT 140 - 400 Thousand/uL 193 234 195 172     MPV 7.5 - 12.5 fL 10.0 10.3 9.9 10.0     SEGS % 54.8 61.6 59.3 60.6     Lymps % 37.1 30.1 31.0 32.4     Monocytes % 7.4 7.7 9.0 6.6     Eosinophils % 0.5 0.4 0.4 0.2     Basophils % 0.2 0.2 0.3 0.2     BANDS % CANCELED CANCELED CANCELED NOT AVAILABLE     Metamyelocytes % CANCELED CANCELED CANCELED NOT AVAILABLE     Myelocytes % CANCELED CANCELED CANCELED NOT AVAILABLE     Promyelocytes % CANCELED CANCELED  CANCELED NOT AVAILABLE     Blasts % CANCELED CANCELED CANCELED NOT AVAILABLE     Reactive Lymphs 0 - 10 % CANCELED CANCELED CANCELED NOT AVAILABLE     NRBC 0 /100 WBC CANCELED CANCELED CANCELED NOT AVAILABLE     Abs Neutrophils 1,500 - 7,800 cells/uL 3,178 3,450 4,151 3,394     Abs Lymphs 850 - 3,900 cells/uL 2,152 1,686 2,170 1,814     Abs Monocytes 200 - 950 cells/uL 429 431 630 370     Abs Eosinophils 15 - 500 cells/uL '29 22 28 11 '$ (L)     Abs Basophils 0 - 200 cells/uL '12 11 21 11     '$ Abs Band Neutrophils 0 - 750 cells/uL CANCELED CANCELED CANCELED NOT AVAILABLE     Abs Metamyelocytes 0 cells/uL CANCELED CANCELED CANCELED NOT AVAILABLE     Abs Myelocytes 0 cells/uL CANCELED CANCELED CANCELED NOT AVAILABLE     Abs Promyelocytes 0 cells/uL CANCELED CANCELED CANCELED NOT AVAILABLE     Abs Blasts 0 cells/uL CANCELED CANCELED CANCELED NOT AVAILABLE     Abs NRBC 0 cells/uL 0 CANCELED CANCELED NOT AVAILABLE     Comments  CANCELED CANCELED CANCELED NOT AVAILABLE     Retic %, Auto %    1.1  1.0   Retic Absolute 20,000 - 80,000 cells/uL    36,850  33,800   EGFR > OR = 60 mL/min/1.41m   43  (L) 40 (L)  39 (L)     IMAGING DATA:    Reviewed the imaging with patient:    06-08-2021 Mammogram showed IMPRESSION:   Stable post-lumpectomy scar in the right breast is benign. Note that distortion from scarring can obscure underlying lesions.     Suggest this patient return for her routine annual screening mammogram in 1 year.     ACR BI-RADS Category 2 -  Benign Finding.     02-25-2020 MRI right knee showed IMPRESSION:     1.  Attenuated posterior root of the lateral meniscus; query small tear.   2.  Focal low-grade chondromalacia with questionable chondral delamination, measuring 0.6 cm in the weightbearing lateral femoral condyle.   3.  Small Baker's cyst.     12-10-2019 DEXA showed osteoporosis is at T score -2.8.     07-01-2019 MRI of breasts showed     Result Impression   IMPRESSION:    1. Right breast: BI-RADS Category 4 - Suspicious. Recommend US-guided core needle biopsy lumpectomy scar at 11:00 location given nodular plateau enhancement seen at the superior aspect of the scar. Findings and recommendations discussed with the patient on 07/01/19 at 12:30 pm.    2. Left breast: BI-RADS Category 1 - Negative.    OVERALL ASSESSMENT: BI-RADS Category 4.     05-26-2019 Mammogram showed   Result Impression     Area of distortion consistent with post surgical scarring in the left breast is benign. The findings and recommendations were discussed with the patient by Dr. LRicki Millerat the conclusion of this examination.    Suggest this patient return for her routine annual screening mammogram in 1 year.    ACR BI-RADS Category 2 -  Benign Finding.     06-09-2018 Mammogram showed    Result Impression     Stable post lumpectomy scarring and radiation therapy change in the right breast is benign. Note that distortion from scarring can obscure underlying lesions.    Suggest this patient return for her routine annual screening mammogram in 1 year.  ACR BI-RADS Category 2 -  Benign Finding.     June 03, 2017 bone  density shows osteopenia T-score of -2.4 in the left femoral neck.    06-03-2017 Mammogram showed Stable post lumpectomy scarring and radiation therapy change in the right breast is benign.    Suggest this patient return for her routine annual screening mammogram in 1 year.    ACR BI-RADS Category 2 -  Benign Finding.    05/24/2016 bilateral diagnostic 2D and 3D mammogram shows stable post lumpectomy changes in the right breast are benign.  December 27, 2015 echocardiogram shows a normal ejection fraction.  05-24-2015 bilateral diagnostic mammogram and bilateral breast ultrasound showed new post lumpectomy scar in the right breast is benign, stable punctate calcifications in the area areolar region of the right breast are benign, stable punctate calcifications in the left breast at 6 o'clock are benign with no evidence of malignancy.  11/22/2014 bilateral 2D and 3D mammogram shows new punctate calcifications in the periareolar region of the right breast are probably benign in mammographic follow-up in 6 months is recommended, new post lumpectomy scar in the right breast is benign. New punctate calcifications in the left breast at 6 o'clock up probably benign.  11/22/2014 bone density shows osteoporosis with a T-score of-2.6 in the right femoral neck.  August 31, 2014 echocardiogram shows left ventricular diastolic dysfunction, normal left ventricular size and systolic function.    PATHOLOGY:    I reviewed the report with her:    07/07/2019 RIGHT BREAST, 11:00 AT 7 CM, CORE BIOPSY:  Fat necrosis and scar, consistent with prior procedure site changes.  No carcinoma identified.    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  09/27/2014 left thyroid nodule atypical follicular lesion of undetermined significance, a right thyroid nodule fine-needle aspiration negative for  malignancy.    IMPRESSION/PLAN:    Problem # 1:  BREAST CANCER      Initial clinical stage IIA pT2N0 right IDC s/p bx 01/14/14 (ER/PR negative, Her-2 positive by copy number = 6.6, Ki-67=50%). MRI breasts 01/29/14 showed lesion to be 2.2 x 1.6 x 2.0 cm. Staging PET/CT negative for distant mets. Neoadjuvant chemotherapy with Her-2 targeted therapy was suggested, since tumor size > 2 cm, so recommended TCH + P (Taxotere, Carboplatin, Herceptin, Perjeta) q3wks x 6 cycles. Systemic therapies started 02/17/14 but pt developed severe diarrhea causing dehydration and electrolyte abnormalities requiring daily hydration and electrolyte replacements so changed regimen to weekly Taxol x 12 weeks with Herceptin/Perjeta q3wks and tolerated only slightly better. Right breast mass resolved on physical exam. Pt was becoming weaker due to ongoing diarrhea on Taxol so discontinued after 04/28/14 dose with plan for repeat breast imaging followed by surgery if response was good.  Breast imaging 04/28/14 (mammo, u/s) and MRI 04/30/14 showed minimal residual disease so Taxol was discontinued after 04/28/14 dose (week #8) and Herceptin/Perjeta continued. Right lumpectomy + SLNB 05/28/14 showed 6 mm residual disease, grade 2, SBR=7/9 (lower mitotic rate than bx), 0/1 LN; repeat ER/PR negative. Since only 5 doses of Perjeta were given pre-operatively and there was residual disease in surgical specimen, she was given the final 6th dose of Perjeta 06/02/14 with usual q3wk dose of Herceptin. Single-agent Herceptin continued q3wk and completed a 1-yr course in 12-2014 and since then no therapy. The radiation therapy was completed.  We reviewed the most recent findings  in the right axilla on imaging and will proceed to a breast MRI.  She was advised to stay well hydrated since she will be given gadolinium.  If an abnormality is found, a biopsy needs to be obtained.  We will await the imaging.    Problem # 2:  ACUTE DVT OF UPPER EXTREMITY      LUE acute DVT  dx'ed by u/s 02/10/14 due to sudden onset of left arm swelling, completed anticoagulation and no evidence of recurrent thrombosis. May consider to remove PORT was concerned about chance to develop DVT but advised that removing the PORT does not usually cause a DVT.    Problem # 3:  ABNORMAL ECHO      Baseline ECHO 02/10/14 with normal EF of 65-70% but with mild diastolic dysfunction. I advised her to continue  f/u with Dr. Reubin Milan for monitoring of cardiac function and recommended again a repeat ECHO and will retrieve the report from 2019.    Problem # 4: THYROID NODULE             She had a total thyroidectomy and no malignancy found.  Follow-up with Endocrinology or now PCP.    PROBLEM # 5 OSTEOPOROSIS    We discussed that she stopped Fosamax for osteoporosis and we discussed Prolia or Reclast.  She needs to obtain dental clearance and monitor for osteonecrosis of the jaw.  She needs to continue weight-bearing exercises, fall precautions, vitamin-D and calcium.  DEXA to be repeated every 2 years.    PROBLEM # 6 MELANOMA    She reports no new skin lesions and continues to be monitored by Dermatology.    Because she has anemia, and diabetes, and an elevated creatinine, she was advised to be followed by PCP and or Nephrology and continue to make an effort to stay well hydrated.  This is particularly important since she is considering intermittent imaging that requires contrast, that may affect the kidney.    COVID-19 Vaccination   Guide for [employer] with Cancer    COVID-19 vaccination remains the most effective way to prevent SARS-CoV-2 infection and should be considered the first line of prevention. The COVID-19 Treatment Guidelines Panel (the Panel) recommends COVID-19 vaccination as soon as possible for everyone who is eligible, including patients with active cancer and patients receiving treatment for cancer.     Adapted from Cancer  COVID-19 Treatment Guidelines ([employer])      Adapted from the CDC "Symptoms and  Caring for Yourself at Home"    Discussed signs and symptoms of COVID19 and advised to immediately contact us for any new problems. Pros and cons of vaccination were reviewed and completed the 3rd booster. Do not recommend the 4th dose.    Reported illnesses have ranged from mild symptoms to severe illness and death for confirmed coronavirus disease 2019 (COVID-19) cases.    These symptoms may appear 2-14 days after exposure (based on the incubation period of MERS-CoV viruses).    Coronavirus symptoms include, but are not limited to:    Fever or chills  Cough  Shortness of breath or difficulty breathing  Fatigue  Muscle or body aches  Headache  New loss of taste or smell  Sore throat  Congestion or runny nose  Nausea or vomiting  Diarrhea    If you have possible or confirmed COVID-19:  Stay home from work, school, and away from other public places.   If you must go out, avoid using any kind of public transportation, ridesharing,  or taxis.   Monitor your symptoms carefully.   If your symptoms get worse, call your healthcare provider immediately.   Get rest and stay hydrated. If you have a medical appointment, call the healthcare provider ahead of time and tell them that you have or may have COVID-19.   For medical emergencies, call 911 and notify the dispatch personnel that you have or may have COVID-19.   Cover your cough and sneezes.   Wash your hands often with soap and water for at least 20 seconds or clean your hands with an alcohol-based hand sanitizer that contains at least 60% alcohol.   As much as possible, stay in a specific room and away from other people in your home.   Also, you should use a separate bathroom, if available.   If you need to be around other people in or outside of the home, wear a facemask.   Avoid sharing personal items with other people in your household, like dishes, towels, and bedding.   Clean all surfaces that are touched often, like counters, tabletops, and doorknobs. Use household  cleaning sprays or wipes according to the label instructions.    If you develop emergency warning signs for COVID-19 get medical attention immediately. Emergency warning signs include*:  Trouble breathing   Persistent pain or pressure in the chest   New confusion or inability to arouse   Bluish lips or face  *This list is not all inclusive. Please consult your medical provider for any other symptoms that are severe or concerning.    There is no specific antiviral treatment recommended for COVID-19. People with COVID-19 should receive supportive care to help relieve symptoms. For severe cases, treatment should include care to support vital organ functions.    Preliminary evidence suggests that COVID-19 not only detrimentally affects respiratory organs, but also can result in "heart inflammation, acute kidney disease, neurological malfunction, blood clots, intestinal damage and liver problems."    People who think they may have been exposed to COVID-19 should contact their healthcare provider immediately.    PLAN:    1.  Completed single-agent Herceptin q3wks to complete 1 yr total course (06/23/14 thru 01/19/15).  Repeat imaging was reviewed.  We will proceed to a breast MRI is recommended by Radiology to further evaluate the right axilla and chest wall.  Orders were provided.  2. The patient has evidence of iron-deficiency anemia and she is postmenopausal.  Apparently she had a colonoscopy.  She was advised to see her gastroenterologist and consider an upper GI endoscopy and capsule endoscopy to complete the GI workup.  3.  Osteoporosis and we discussed Prolia or Reclast.  She was advised to discuss this with her dentist.  She needs to obtain dental clearance in order to proceed.  Educational material about these 2 medications was provided.    4. Thyroidectomy done due to atypical left thyroid nodule and no malignancy, and continued follow-up with endocrinology recommended.  5. Renal insufficiency and diabetes ongoing.   She is monitored by her primary care physician advised her to stay well hydrated and take 60-80 oz of water a day.  This is particularly important when imaging needs to be done with contrast such as in a breast MRI.  6. History of malignant melanoma and followed by Dermatology and no new suspicious findings.    Orders Placed This Encounter   Procedures    MRI BREAST BILATERAL W/WO CONTRAST    Iron, TIBC and Ferritin Panel - [facility]  CBC w/ Diff Lavender    Comprehensive Metabolic Panel    Vitamin B12, Blood Green Plasma Separator Tube    Reticulocyte Automated [facility]        Thank you very much for allowing our continued participation in the care of this patient.     Medical decision-making was highly complex  and more than 50% of this _45_ min visit was spent in educating the patient about their condition, discussing compliance issues, counseling including answering all of the patients questions and coordination of care. This included review of relevant laboratory, radiology and other diagnostic tests, explaining medical management choices and the patient verbalized understanding.                                                                                                                                                              RTO:    6 weeks    I certify that I have reviewed the documentation contained in this clinical record and that it is accurately recorded.  This document contains private and confidential health information protected by state and federal law and any release of this information requires the written prior authorization of the above mentioned patient.    Please note this report was dictated with the use of voice recognition software.  It may contain inadvertent spelling or grammatical errors which were not detected during the editing process.       Take a virtual tour of our facility:  [email or web address]          --------------------------------------  [name], MD, MBA

## 2023-01-03 ENCOUNTER — Telehealth: Payer: Self-pay | Admitting: Hematology & Oncology

## 2023-01-03 NOTE — Telephone Encounter (Signed)
Patient is requesting to clarify with Dr Dorris Singh which of the 2 injections she needs to clear with her dentist or if it's for both injections and why are there 2 medications.   Also says she thought it was going to be an oral medication instead of injection and would like to clarify this.    Please assist

## 2023-01-07 NOTE — Telephone Encounter (Signed)
Returned pat call to address questions regarding medications.    Pt seen by Dr. Dorris Singh on 3/12 at that time they discussed potentially using Prolia or Reclast for osteoporosis and would need dental clearance before starting one of the two meds discussed.  Left a VM clarifying what was discussed and to return clinic call for further information.

## 2023-01-24 ENCOUNTER — Telehealth: Payer: Self-pay | Admitting: Hematology & Oncology

## 2023-01-24 NOTE — Telephone Encounter (Signed)
Results found in Care Everywhere. Pt called and verbally notified of this. Confirmed f/u appt for 02/13/23 at 8:30a. Pt verbalized understanding.

## 2023-01-24 NOTE — Telephone Encounter (Signed)
Patient is following up on MRI breast result completed 01/12/23 at [facility] in [place]. Advised report has not been received    Patient is requesting assistance  on how to proceed since it hasn't been received.  Says she doesn't have their phone number.    Please assist

## 2023-02-04 LAB — CBC WITH DIFF, BLOOD
Abs Neutrophils: 2101 cells/uL (ref 1500–7800)
MCH: 30 pg (ref 27.0–33.0)
Monocytes: 9.1 %
RBC: 3.47 10*6/uL — ABNORMAL LOW (ref 3.80–5.10)

## 2023-02-04 LAB — RETICULOCYTES AUTOMATED, BLOOD

## 2023-02-04 LAB — IRON, TIBC AND FERRITIN PANEL - QUEST
IIBC: 417 mcg/dL (calc) (ref 250–450)
Iron Saturation: 37 % (calc) (ref 16–45)

## 2023-02-04 LAB — COMPREHENSIVE METABOLIC PANEL, BLOOD
Albumin: 4.1 g/dL (ref 3.6–5.1)
Calcium: 8.8 mg/dL (ref 8.6–10.4)

## 2023-02-05 LAB — CBC WITH DIFF, BLOOD
Abs Basophils: 9 {cells}/uL (ref 0–200)
Abs Eosinophils: 19 cells/uL (ref 15–500)
Abs Lymphs: 2143 {cells}/uL (ref 850–3900)
Abs Monocytes: 428 {cells}/uL (ref 200–950)
Basophils: 0.2 %
Eosinophils: 0.4 %
HCT: 32.1 % — ABNORMAL LOW (ref 35.0–45.0)
HGB: 10.4 g/dL — ABNORMAL LOW (ref 11.7–15.5)
Lymps: 45.6 %
MCHC: 32.4 g/dL (ref 32.0–36.0)
MCV: 92.5 fL (ref 80.0–100.0)
MPV: 10.3 fL (ref 7.5–12.5)
PLT: 182 10*3/uL (ref 140–400)
RDW: 13.2 % (ref 11.0–15.0)
SEGS: 44.7 %
WBC: 4.7 10*3/uL (ref 3.8–10.8)

## 2023-02-05 LAB — COMPREHENSIVE METABOLIC PANEL, BLOOD
ALT (SGPT): 9 U/L (ref 6–29)
AST (SGOT): 13 U/L (ref 10–35)
Albumin/Glob Ratio: 1.8 (calc) (ref 1.0–2.5)
Alkaline Phos: 78 U/L (ref 37–153)
BUN/Creatinine Ratio: 24 (calc) — ABNORMAL HIGH (ref 6–22)
BUN: 31 mg/dL — ABNORMAL HIGH (ref 7–25)
Bilirubin, Total: 1.2 mg/dL (ref 0.2–1.2)
Carbon Dioxide: 23 mmol/L (ref 20–32)
Chloride: 107 mmol/L (ref 98–110)
Creatinine: 1.29 mg/dL — ABNORMAL HIGH (ref 0.60–1.00)
EGFR: 42 mL/min/{1.73_m2} — ABNORMAL LOW (ref >=60)
Globulin: 2.3 g/dL (ref 1.9–3.7)
Glucose: 108 mg/dL — ABNORMAL HIGH (ref 65–99)
Potassium: 4.9 mmol/L (ref 3.5–5.3)
Sodium: 138 mmol/L (ref 135–146)
Total Protein: 6.4 g/dL (ref 6.1–8.1)

## 2023-02-05 LAB — VITAMIN B12, BLOOD: Vitamin B12: 281 pg/mL (ref 200–1100)

## 2023-02-05 LAB — IRON, TIBC AND FERRITIN PANEL - QUEST
Ferritin: 15 ng/mL — ABNORMAL LOW (ref 16–288)
Iron: 154 ug/dL (ref 45–160)

## 2023-02-05 LAB — ~~LOC~~ 27.29, BLOOD: ~~LOC~~ 27.29: <10 U/mL (ref <38)

## 2023-02-12 ENCOUNTER — Encounter: Payer: Self-pay | Admitting: Hematology & Oncology

## 2023-02-12 ENCOUNTER — Ambulatory Visit (INDEPENDENT_AMBULATORY_CARE_PROVIDER_SITE_OTHER): Payer: Medicare Other | Admitting: Hematology & Oncology

## 2023-02-12 ENCOUNTER — Telehealth: Payer: Self-pay

## 2023-02-12 VITALS — BP 128/67 | HR 84 | Temp 97.8°F | Resp 18 | Ht 65.0 in | Wt 181.3 lb

## 2023-02-12 DIAGNOSIS — Z171 Estrogen receptor negative status [ER-]: Secondary | ICD-10-CM

## 2023-02-12 DIAGNOSIS — C50411 Malignant neoplasm of upper-outer quadrant of right female breast: Secondary | ICD-10-CM

## 2023-02-12 DIAGNOSIS — D508 Other iron deficiency anemias: Secondary | ICD-10-CM

## 2023-02-12 DIAGNOSIS — M810 Age-related osteoporosis without current pathological fracture: Secondary | ICD-10-CM

## 2023-02-12 MED ORDER — VITRON-C 65-125 MG PO TABS: 1.0000 | ORAL_TABLET | Freq: Every day | ORAL | Status: AC

## 2023-02-12 MED ORDER — COLCHICINE 0.6 MG OR TABS
0.6000 mg | ORAL_TABLET | Freq: Every day | ORAL | Status: AC
Start: 2023-01-31 — End: ?

## 2023-02-12 NOTE — Patient Instructions (Addendum)
Orders Placed This Encounter   Procedures    CBC w/ Diff [facility]    Comprehensive Metabolic Panel    Iron, TIBC and Ferritin Panel - [facility]    Vitamin B12, Blood Green Plasma Separator Tube    [place] 27.29, BLOOD    Reticulocyte Automated [facility]    CBC w/ Diff [facility]    Comprehensive Metabolic Panel    Ferritin, Blood Green Plasma Separator Tube    Iron/IBC Panel    Vitamin B12, Blood Green Plasma Separator Tube    [place] 27.29, BLOOD     Please have your Labs done at least 1 to 2 week(s) prior to your appointment.

## 2023-02-12 NOTE — Progress Notes (Signed)
Kayla Joseph [name], MD, [facility]  Health Sciences Clinical Professor  Division of Hematology/Oncology, Department of Medicine  Medical Director, [facility]  [facility]  [facility]  [address], [address]  [place], [place] [address]  Tel: [phone]  Fax: [phone]     HEMATOLOGY ONCOLOGY FOLLOW UP NOTE     Date of Service:   02/12/2023       Kayla Joseph, DOB Sep 05, 1946    REFERRING MD: Liston Alba  Radiation oncology: Mayra Reel  PCP: Mickel Baas  GYN: Marva Panda   Cardiology: Aurelio Jew    CHIEF COMPLAINT: Routine follow-up visit for right breast cancer and melanoma    HISTORY OF PRESENT ILLNESS:    77 y/o Caucasian woman with mammographically detected clinical stage IIA T2N0 right breast invasive ductal carcinoma s/p bx  01/14/14 (SBR 9/9, ER/PR negative, Ki-67=50%, Her-2/neu FISH ratio=1.14 but copy number=6.6 so positive). MRI breast 01/29/14 showed mass to be 2.2 x 1.6 x 2.0 cm. Staging PET/CT 01/27/14 was negative for distant mets; showed only known right breast tumor and incidental left thyroid nodule. Planned neoadjuvant systemic therapy: TCH + P x 6 cycles followed by surgery and radiation therapy. Cycle #1 TCH+P given 02/17/14. Pt developed severe diarrhea and dehydration causing electrolyte derangements requiring daily replacement therapies and hydration.  Therefore, chemotherapy was changed to weekly Taxol for 12 weeks and Herceptin plus Perjeta every 3 weeks was continued. Taxol started 03/10/14 and completed only 8 weeks on 04/28/14 due to cumulative severe side effects and MRI breast 04/30/14 showing only 4mm of residual enhancement. Herceptin/Perjeta continued q3wks for 6 total cycles. Right lumpectomy + SLNB done 05/28/14 (6 mm residual IDC, grade 2, SBR 7/9, no LI, 0/1 LN; ypT1bN0; residual tumor had lower a mitotic score compared to initial biopsy specimen likely due to chemotherapy effect;  repeat ER and PR on residual tumor was again negative for both). Due to residual disease although minimal, pt received final dose (#6)  of Perjeta 06/02/14. Herceptin single-agent q3wks started 06/23/14.     July 14, 2014 (Herceptin week #22)     Started single-agent Herceptin 06/23/14 and tolerated it well without side effects. Hair is starting to grow.  Neuropathy in the fingers and bottoms of the feet continue but improve when she takes gabapentin 300 mg 3 times a day.  She does not want to increase the dose since it is controlling her neuropathy and she does not want to increase fatigue due to medication side effect.  She just returned from her trip to [place]. Diarrhea continues but it was improved with more solid form during her [place] trip.  The only difference during that trip was that her friends were cooking all of her meals for her so a change in diet seems to have improved the diarrhea.  Since her return, her diarrhea has resumed unchanged.  She has about 6 watery episodes of diarrhea per day and has had 3 episodes thus far this morning.  She continues Percocet as needed but also has a prior prescription for more tincture of opium which she will change back to since postop pain has resolved.  She has not yet done repeat stool studies since she was on her trip but will do so soon with prior order.  She has not seen the gastroenterologist yet but will ask her primary care doctor for a referral to see one soon.  Dysuria symptoms recurred  this past weekend.  Bactrim worked well in the past after the 1st dose. She saw Dr Mayra Reel 06/25/14 and will start xrt next week on 07/21/14.  She receives hydration every 3 weeks with Herceptin but requests more frequent visits since her diarrhea is ongoing. Labs (CBC, CMP) 06/02/14 sig for H/H=10.6/32.9; gluc=221.     The patient completed the Herceptin every 3 weeks for total of 1 year of adjuvant therapy in 12-2014 and remains off therapy.     INTERVAL  HISTORY:    The patient is here to review the repeat imaging.  She continues to have discomfort in the right axilla.  She was recommended to have a breast MRI.  She is followed by Dr. Jori Moll of GI and has completed colonoscopy but does not recall doing an upper GI endoscopy and capsule endoscopy.  She denies any visible bleeding.  She has been taking B12 but an interrupted it was found to be low on B12 again and now has resumed the oral B12 intake at 1000 mcg daily.  She stopped the Fosamax and is asking for a medication that is stronger.  She is followed by the dentist.  She is aware for borderline elevated creatinine in his monitored by PCP and is trying to stay well hydrated.    02-12-2023 Patient obtained dental clearance and may proceed to Prolia.   Patient denies any new breast related problems.    11-20-2019 1st vaccine COVID19 Pfizer at [employer]  12-11-2019 2nd vaccine COVID19 Pfizer at [employer]        PAST MEDICAL HISTORY:      Reviewed and no changes.    Stage IIA T2N0 right breast IDC --> ypT1bN0   LUE DVT dx'ed by U/S 02/10/14 -- Lovenox started same day; resolved on 05/18/14 U/S; completed Lovenox 06/07/14   Stage I pT1aN0 melanoma of upper back s/p excision  DM II  Hyperlipidemia  Hypertension  BCC skin  Thyroid nodules bilaterally   She had a colonoscopy in 2018 and to be repeated in 2023.               PAST SURGICAL HISTORY:      Reviewed and no changes.    05/28/14        Right lumpectomy + SLNB   02/05/14      L. chest port placement ([facility])  07/04/11      Wide excision upper back melanoma + R. supraclavicular SLNB (lentigo melanoma and melanoma in situ, 0.75 mm, no residual invasive melanoma, 0/1 LN)   2013         Facelift  2012         Gastric bypass  1999         Cholecystectomy  1995         Left foot fracture repair with pins  Age 17      Tonsillectomy        PAST OB/GYN HISTORY:      Reviewed and no changes.      G0P0. Menarche at 55. OCP x 15 yrs; last age 33s. HRT late 68s for <63mths; stopped  after [facility] study results reported. LMP age 68.        HEALTH MAINTENANCE:    Colo -- 3-4 yrs ago; repeat due next year   DXA -- 2015 osteopenia; prescribed Fosamax but never started   Pap -- 11/2013 (one prior abnl pap and polyp removed)        FAMILY HISTORY:  Reviewed and no changes.    Mother -- died 38 of CHF  Father -- died 35 of decline after stroke  Sister -- died 72 of NHL; another sister alive and well     SOCIAL HISTORY:                Reviewed and no changes    Lives with partner, Angelena Form.   Kids -- none   Retired [employer] park manager.   Tobacco -- only 4 yrs in early 75s,  <1ppd  Alcohol - 1 glass wine/mth at most   Drugs - none          CURRENT MEDS:      I reviewed the medication list.    ALLERGIES:     No Known Allergies    REVIEW OF SYSTEMS (ROS):    A comprehensive 15-point ROS was performed and reviewed with the patient and is negative unless noted above.       EXAM:        12/19/2021     8:34 AM 12/19/2021     8:37 AM 01/01/2023     8:20 AM 01/01/2023     8:40 AM 01/01/2023     8:43 AM 02/12/2023     8:30 AM 02/12/2023     8:33 AM   Vitals   Taken/Reported In Clinic   In Clinic  In Clinic    Systolic 113  125   128    Diastolic 72  74   67    Position Sitting  Sitting   Sitting    Site Right arm  Right arm   Right arm    Cuff Size Regular  Regular   Regular    Pulse 114  101   84    Resp 18  20   18     Temp 97.5 F (36.4 C)  97.7 F (36.5 C)   97.8 F (36.6 C)    Temp Source Temporal  Temporal   Temporal    Weight (kg) 83.35 kg  80.15 kg   82.25 kg    Weight (lbs) 183 lb 12.1 oz  176 lb 11.2 oz   181 lb 5.3 oz    Height (cm) 165.1 cm     165.1 cm    Height (ft in) 5\' 5"      5\' 5"     BMI (kg/m2) 30.58 kg/m2     30.17 kg/m2    BSA (m2) 1.96 m2     1.94 m2    Fall Risk  No fall risk identified   No fall risk identified  No fall risk identified      GENERAL: The patient is well developed and well nourished, ambulatory, in no acute distress.  HEENT: normocephalic/atraumatic, anicteric  sclera.  HEART: RRR. No murmurs.  CHEST: Clear to auscultation bilaterally.  AXILLAE:  Palpable fullness in the right axilla.  No suspicious findings in the left axilla.  BREASTS:  Bilaterally examined and overall no new concerning findings.  ABDOMEN: soft, non-tender, non-distended, normal bowel sounds, no rebound or guarding, no appreciable masses  EXTREMITIES: Trace edema of the lower extremities.  SKIN:  No rash or jaundice.  NEURO: AOx3,  no focal deficits.   PSYCHIATRIC:  Good insight adequate mood and affect.    LAB DATA:    I reviewed the lab work with her:     Latest Reference Range & Units 06/15/20 11:01 12/14/20 09:25 06/16/21 08:21 06/06/22 10:44 06/06/22 10:46 12/18/22 08:14 02/04/23  07:30   Sodium 135 - 146 mmol/L 139 134 (L) 136 136  136 138   Potassium 3.5 - 5.3 mmol/L 5.2 5.2 5.0 4.8  4.3 4.9   Chloride 98 - 110 mmol/L 108 105 105 107  106 107   Carbon Dioxide 20 - 32 mmol/L 22 19 (L) 23 19 (L)  20 23   BUN 7 - 25 mg/dL 25 25 25 31  (H)  28 (H) 31 (H)   Creatinine 0.60 - 1.00 mg/dL 1.61 (H) 0.96 (H) 0.45 (H) 1.37 (H)  1.40 (H) 1.29 (H)   eGFR non-Afr.American > OR = 60 mL/min/1.53m2 45 (L) 44 (L)        eGFR African American > OR = 60 mL/min/1.37m2 52 (L) 51 (L)        Glucose 65 - 99 mg/dL 98 409 (H) 811 (H) 914 (H)  92 108 (H)   Calcium 8.6 - 10.4 mg/dL 8.7 8.9 8.8 8.2 (L)  8.7 8.8   Total Protein 6.1 - 8.1 g/dL 6.6 6.6 6.5 6.4  6.8 6.4   Alkaline Phos 37 - 153 U/L 96 96 102 103  96 78   AST (SGOT) 10 - 35 U/L 12 12 12 15  13 13    ALT (SGPT) 6 - 29 U/L 6 7 6 9  7 9    Albumin 3.6 - 5.1 g/dL 3.9 4.2 4.0 4.0  4.3 4.1   Bilirubin, Total 0.2 - 1.2 mg/dL 1.3 (H) 1.3 (H) 1.0 0.7  0.8 1.2   BUN/Creatinine Ratio 6 - 22 (calc) 21 21 20 23  (H)  20 24 (H)   Morrill 27.29 <38 U/mL 17 15 15 10   <10 <10   Ferritin 16 - 288 ng/mL 16   7 (L)  4 (L) 15 (L)   Globulin 1.9 - 3.7 g/dL (calc) 2.7 2.4 2.5 2.4  2.5 2.3   Albumin/Glob Ratio 1.0 - 2.5 (calc) 1.4 1.8 1.6 1.7  1.7 1.8   Iron 45 - 160 mcg/dL 782     60 956   IIBC  250 - 450 mcg/dL (calc) 213     086 (H) 417   Iron Saturation 16 - 45 % (calc) 34     12 (L) 37   Vitamin B12 200 - 1,100 pg/mL    240  193 (L) 281   Vitamin D, 25-OH, Total 30 - 100 ng/mL 35 37 51  34     WBC 3.8 - 10.8 Thousand/uL 5.8 5.6 7.0 5.6   4.7   RBC 3.80 - 5.10 Million/uL 3.67 (L) 3.71 (L) 3.65 (L) 3.35 (L)   3.47 (L)   HGB 11.7 - 15.5 g/dL 57.8 (L) 46.9 (L) 62.9 (L) 9.9 (L)   10.4 (L)   HCT 35.0 - 45.0 % 33.5 (L) 33.8 (L) 34.4 (L) 30.3 (L)   32.1 (L)   MCV 80.0 - 100.0 fL 91.3 91.1 94.2 90.4   92.5   MCH 27.0 - 33.0 pg 30.2 29.9 31.0 29.6   30.0   MCHC 32.0 - 36.0 g/dL 52.8 41.3 24.4 01.0   27.2   RDW 11.0 - 15.0 % 11.9 12.0 11.8 12.3   13.2   PLT 140 - 400 Thousand/uL 193 234 195 172   182   MPV 7.5 - 12.5 fL 10.0 10.3 9.9 10.0   10.3   SEGS % 54.8 61.6 59.3 60.6   44.7   Lymps % 37.1 30.1 31.0 32.4   45.6   Monocytes % 7.4 7.7  9.0 6.6   9.1   Eosinophils % 0.5 0.4 0.4 0.2   0.4   Basophils % 0.2 0.2 0.3 0.2   0.2   BANDS % CANCELED CANCELED CANCELED NOT AVAILABLE   NOT AVAILABLE   Metamyelocytes % CANCELED CANCELED CANCELED NOT AVAILABLE   NOT AVAILABLE   Myelocytes % CANCELED CANCELED CANCELED NOT AVAILABLE   NOT AVAILABLE   Promyelocytes % CANCELED CANCELED CANCELED NOT AVAILABLE   NOT AVAILABLE   Blasts % CANCELED CANCELED CANCELED NOT AVAILABLE   NOT AVAILABLE   Reactive Lymphs 0 - 10 % CANCELED CANCELED CANCELED NOT AVAILABLE   NOT AVAILABLE   NRBC 0 /100 WBC CANCELED CANCELED CANCELED NOT AVAILABLE   NOT AVAILABLE   Abs Neutrophils 1,500 - 7,800 cells/uL 3,178 3,450 4,151 3,394   2,101   Abs Lymphs 850 - 3,900 cells/uL 2,152 1,686 2,170 1,814   2,143   Abs Monocytes 200 - 950 cells/uL 429 431 630 370   428   Abs Eosinophils 15 - 500 cells/uL (L)   19   Abs Basophils 0 - 200 cells/uL Abs Band Neutrophils 0 - 750 cells/uL CANCELED CANCELED CANCELED NOT AVAILABLE   NOT AVAILABLE   Abs Metamyelocytes 0 cells/uL CANCELED CANCELED CANCELED NOT AVAILABLE   NOT AVAILABLE    Abs Myelocytes 0 cells/uL CANCELED CANCELED CANCELED NOT AVAILABLE   NOT AVAILABLE   Abs Promyelocytes 0 cells/uL CANCELED CANCELED CANCELED NOT AVAILABLE   NOT AVAILABLE   Abs Blasts 0 cells/uL CANCELED CANCELED CANCELED NOT AVAILABLE   NOT AVAILABLE   Abs NRBC 0 cells/uL 0 CANCELED CANCELED NOT AVAILABLE   NOT AVAILABLE   Comments  CANCELED CANCELED CANCELED NOT AVAILABLE   NOT AVAILABLE   Retic %, Auto %    1.1  1.0 1.6   Retic Absolute 20,000 - 80,000 cells/uL    36,850  33,800 55,520   EGFR > OR = 60 mL/min/1.60m2   43 (L) 40 (L)  39 (L) 42 (L)     IMAGING DATA:    Reviewed the imaging with patient:    01-12-2023 Breast MRI showed Impression      1. Right breast: BI-RADS Category 2 - Benign. Status post lumpectomy. No MR evidence of malignancy. Clinical follow-up is recommended for the palpable area in the right axilla.    2. Left breast: BI-RADS Category 1 - Negative. No MR evidence of malignancy.    3.  Recommend routine annual mammography in 11 months.      OVERALL ASSESSMENT: BI-RADS Category 2.    12-14-2022 Breast mammogram and US showed Impression    1.  Stable post-surgical scar in the right breast is benign. Note that distortion from scarring can obscure underlying lesions.    2.  Vague hypoechoic area in the right axilla requires additional evaluation. We will recall the patient for an MRI exam. This may relate to scar, however not with certainty. The patient states she only noticed the lump within the past year. Due to the intramuscular location, additional imaging is recommended.    3.  Fat lobule in the left breast at 2 o'clock is benign.    Findings and recommendations were discussed with the patient at the time of the exam by Dr. Billee Cashing. In addition, the patient's referring provider, Truett Mainland, was notified of the exam results by Dr. Billee Cashing by secure text message [facility] Text) on 12/14/2022.  ACR BI-RADS Category 0 -  Incomplete: Need Additional Imaging  Evaluation.    06-08-2021 Mammogram showed IMPRESSION:   Stable post-lumpectomy scar in the right breast is benign. Note that distortion from scarring can obscure underlying lesions.     Suggest this patient return for her routine annual screening mammogram in 1 year.     ACR BI-RADS Category 2 -  Benign Finding.     02-25-2020 MRI right knee showed IMPRESSION:     1.  Attenuated posterior root of the lateral meniscus; query small tear.   2.  Focal low-grade chondromalacia with questionable chondral delamination, measuring 0.6 cm in the weightbearing lateral femoral condyle.   3.  Small Baker's cyst.     12-10-2019 DEXA showed osteoporosis is at T score -2.8.     07-01-2019 MRI of breasts showed     Result Impression   IMPRESSION:    1. Right breast: BI-RADS Category 4 - Suspicious. Recommend US-guided core needle biopsy lumpectomy scar at 11:00 location given nodular plateau enhancement seen at the superior aspect of the scar. Findings and recommendations discussed with the patient on 07/01/19 at 12:30 pm.    2. Left breast: BI-RADS Category 1 - Negative.    OVERALL ASSESSMENT: BI-RADS Category 4.     05-26-2019 Mammogram showed   Result Impression     Area of distortion consistent with post surgical scarring in the left breast is benign. The findings and recommendations were discussed with the patient by Dr. Patsey Berthold at the conclusion of this examination.    Suggest this patient return for her routine annual screening mammogram in 1 year.    ACR BI-RADS Category 2 -  Benign Finding.     06-09-2018 Mammogram showed    Result Impression     Stable post lumpectomy scarring and radiation therapy change in the right breast is benign. Note that distortion from scarring can obscure underlying lesions.    Suggest this patient return for her routine annual screening mammogram in 1 year.    ACR BI-RADS Category 2 -  Benign Finding.     June 03, 2017 bone density shows osteopenia T-score of -2.4 in the left femoral  neck.    06-03-2017 Mammogram showed Stable post lumpectomy scarring and radiation therapy change in the right breast is benign.    Suggest this patient return for her routine annual screening mammogram in 1 year.    ACR BI-RADS Category 2 -  Benign Finding.    05/24/2016 bilateral diagnostic 2D and 3D mammogram shows stable post lumpectomy changes in the right breast are benign.  December 27, 2015 echocardiogram shows a normal ejection fraction.  05-24-2015 bilateral diagnostic mammogram and bilateral breast ultrasound showed new post lumpectomy scar in the right breast is benign, stable punctate calcifications in the area areolar region of the right breast are benign, stable punctate calcifications in the left breast at 6 o'clock are benign with no evidence of malignancy.  11/22/2014 bilateral 2D and 3D mammogram shows new punctate calcifications in the periareolar region of the right breast are probably benign in mammographic follow-up in 6 months is recommended, new post lumpectomy scar in the right breast is benign. New punctate calcifications in the left breast at 6 o'clock up probably benign.  11/22/2014 bone density shows osteoporosis with a T-score of-2.6 in the right femoral neck.  August 31, 2014 echocardiogram shows left ventricular diastolic dysfunction, normal left ventricular size and systolic function.    PATHOLOGY:    I reviewed the  report with her:    07/07/2019 RIGHT BREAST, 11:00 AT 7 CM, CORE BIOPSY:  Fat necrosis and scar, consistent with prior procedure site changes.  No carcinoma identified.    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  09/27/2014 left thyroid nodule atypical follicular lesion of undetermined significance, a right thyroid nodule fine-needle aspiration negative for malignancy.    IMPRESSION/PLAN:    Problem # 1:  BREAST CANCER       Initial clinical stage IIA pT2N0 right IDC s/p bx 01/14/14 (ER/PR negative, Her-2 positive by copy number = 6.6, Ki-67=50%). MRI breasts 01/29/14 showed lesion to be 2.2 x 1.6 x 2.0 cm. Staging PET/CT negative for distant mets. Neoadjuvant chemotherapy with Her-2 targeted therapy was suggested, since tumor size > 2 cm, so recommended TCH + P (Taxotere, Carboplatin, Herceptin, Perjeta) q3wks x 6 cycles. Systemic therapies started 02/17/14 but pt developed severe diarrhea causing dehydration and electrolyte abnormalities requiring daily hydration and electrolyte replacements so changed regimen to weekly Taxol x 12 weeks with Herceptin/Perjeta q3wks and tolerated only slightly better. Right breast mass resolved on physical exam. Pt was becoming weaker due to ongoing diarrhea on Taxol so discontinued after 04/28/14 dose with plan for repeat breast imaging followed by surgery if response was good.  Breast imaging 04/28/14 (mammo, u/s) and MRI 04/30/14 showed minimal residual disease so Taxol was discontinued after 04/28/14 dose (week #8) and Herceptin/Perjeta continued. Right lumpectomy + SLNB 05/28/14 showed 6 mm residual disease, grade 2, SBR=7/9 (lower mitotic rate than bx), 0/1 LN; repeat ER/PR negative. Since only 5 doses of Perjeta were given pre-operatively and there was residual disease in surgical specimen, she was given the final 6th dose of Perjeta 06/02/14 with usual q3wk dose of Herceptin. Single-agent Herceptin continued q3wk and completed a 1-yr course in 12-2014 and since then no therapy. The radiation therapy was completed.  We reviewed the most recent findings in the right axilla on imaging and we ordered a breast MRI and reviewed it. She was advised to stay well hydrated since she will be given gadolinium.  A biopsy is not necessary at this time.  No new concerns on today's breast exam.  She remains off active therapy.  Continue to monitor.  All of her questions were answered to her satisfaction.    Problem #  2:  ACUTE DVT OF UPPER EXTREMITY      LUE acute DVT dx'ed by u/s 02/10/14 due to sudden onset of left arm swelling, completed anticoagulation and no evidence of recurrent thrombosis. May consider to remove PORT was concerned about chance to develop DVT but advised that removing the PORT does not usually cause a DVT.    Problem # 3:  ABNORMAL ECHO      Baseline ECHO 02/10/14 with normal EF of 65-70% but with mild diastolic dysfunction. I advised her to continue  f/u with Dr. Vernice Jefferson for monitoring of cardiac function and recommended again a repeat ECHO and will retrieve the report from 2019.    Problem # 4: THYROID NODULE             She had a total thyroidectomy and no malignancy found.  Follow-up with Endocrinology or now PCP.    PROBLEM # 5 OSTEOPOROSIS    We discussed that she stopped Fosamax for osteoporosis and we discussed Prolia or Reclast.  She needs to obtain dental clearance  and monitor for osteonecrosis of the jaw.  She needs to continue weight-bearing exercises, fall precautions, vitamin-D and calcium.  DEXA to be repeated every 2 years.    PROBLEM # 6 MELANOMA    To be followed by Dermatology and no new reported lesions.    Because she has anemia, and diabetes, and an elevated creatinine, she was advised to be followed by PCP and or Nephrology and continue to make an effort to stay well hydrated.  This is particularly important since she is considering intermittent imaging that requires contrast, that may affect the kidney.  Her creatinine is slightly improved.    COVID-19 Vaccination   Guide for [employer] with Cancer    COVID-19 vaccination remains the most effective way to prevent SARS-CoV-2 infection and should be considered the first line of prevention. The COVID-19 Treatment Guidelines Panel (the Panel) recommends COVID-19 vaccination as soon as possible for everyone who is eligible, including patients with active cancer and patients receiving treatment for cancer.     Adapted from Cancer  COVID-19  Treatment Guidelines (http://www.myers.net/)      Adapted from the CDC "Symptoms and Caring for Yourself at Home"    Discussed signs and symptoms of COVID19 and advised to immediately contact us for any new problems. Pros and cons of vaccination were reviewed and completed the 3rd booster. Do not recommend the 4th dose.    Reported illnesses have ranged from mild symptoms to severe illness and death for confirmed coronavirus disease 2019 (COVID-19) cases.    These symptoms may appear 2-14 days after exposure (based on the incubation period of MERS-CoV viruses).    Coronavirus symptoms include, but are not limited to:    Fever or chills  Cough  Shortness of breath or difficulty breathing  Fatigue  Muscle or body aches  Headache  New loss of taste or smell  Sore throat  Congestion or runny nose  Nausea or vomiting  Diarrhea    If you have possible or confirmed COVID-19:  Stay home from work, school, and away from other public places.   If you must go out, avoid using any kind of public transportation, ridesharing, or taxis.   Monitor your symptoms carefully.   If your symptoms get worse, call your healthcare provider immediately.   Get rest and stay hydrated. If you have a medical appointment, call the healthcare provider ahead of time and tell them that you have or may have COVID-19.   For medical emergencies, call 911 and notify the dispatch personnel that you have or may have COVID-19.   Cover your cough and sneezes.   Wash your hands often with soap and water for at least 20 seconds or clean your hands with an alcohol-based hand sanitizer that contains at least 60% alcohol.   As much as possible, stay in a specific room and away from other people in your home.   Also, you should use a separate bathroom, if available.   If you need to be around other people in or outside of the home, wear a facemask.   Avoid sharing personal items with other people in your household, like dishes, towels, and bedding.   Clean all surfaces that  are touched often, like counters, tabletops, and doorknobs. Use household cleaning sprays or wipes according to the label instructions.    If you develop emergency warning signs for COVID-19 get medical attention immediately. Emergency warning signs include*:  Trouble breathing   Persistent pain or pressure in the chest  New confusion or inability to arouse   Bluish lips or face  *This list is not all inclusive. Please consult your medical provider for any other symptoms that are severe or concerning.    There is no specific antiviral treatment recommended for COVID-19. People with COVID-19 should receive supportive care to help relieve symptoms. For severe cases, treatment should include care to support vital organ functions.    Preliminary evidence suggests that COVID-19 not only detrimentally affects respiratory organs, but also can result in "heart inflammation, acute kidney disease, neurological malfunction, blood clots, intestinal damage and liver problems."    People who think they may have been exposed to COVID-19 should contact their healthcare provider immediately.    PLAN:    1.  Completed single-agent Herceptin q3wks to complete 1 yr total course (06/23/14 thru 01/19/15).  Repeat imaging was reviewed.  We will proceed to a breast MRI is recommended by Radiology to further evaluate the right axilla and chest wall.  So far no evidence for recurrent disease.  2. The patient has evidence of iron-deficiency anemia and she is postmenopausal.  Apparently she had a colonoscopy.  She was advised to see her gastroenterologist and consider an upper GI endoscopy and capsule endoscopy to complete the GI workup.  3.  Osteoporosis and we discussed Prolia or Reclast.  She was advised to discuss this with her dentist.  She obtain that the clearance.  We will proceed to Prolia.  Needs to continue monitoring for ONJ.  4. Thyroidectomy done due to atypical left thyroid nodule and no malignancy, and continued follow-up with  endocrinology recommended.  5. Renal insufficiency and diabetes ongoing.  She is monitored by her primary care physician advised her to stay well hydrated and take 60-80 oz of water a day.  This is particularly important when imaging needs to be done with contrast such as in a breast MRI.  6. History of malignant melanoma and followed by Dermatology and no new reported skin problems.    Orders Placed This Encounter   Procedures    CBC w/ Diff Lavender    Comprehensive Metabolic Panel    Iron, TIBC and Ferritin Panel - [facility]    Vitamin B12, Blood Green Plasma Separator Tube    Angelica 27.29, BLOOD    Reticulocyte Automated Lavender        Thank you very much for allowing our continued participation in the care of this patient.     Medical decision-making was highly complex  and more than 50% of this _40_ min visit was spent in educating the patient about their condition, discussing compliance issues, counseling including answering all of the patients questions and coordination of care. This included review of relevant laboratory, radiology and other diagnostic tests, explaining medical management choices and the patient verbalized understanding.  RTO:    3-4 months    I certify that I have reviewed the documentation contained in this clinical record and that it is accurately recorded.  This document contains private and confidential health information protected by state and federal law and any release of this information requires the written prior authorization of the above mentioned patient.    Please note this report was dictated with the use of voice recognition software.  It may contain inadvertent spelling or grammatical errors which were not detected during the editing process.       Take a virtual tour of our facility:  [email or web address]          --------------------------------------  Truett Mainland, MD, MBA

## 2023-02-12 NOTE — Telephone Encounter (Signed)
Called Pt to inquire which date would like to start Prolia and if AM or PM. Pt states Mondays, Wednesdays, or Fridays in the AM. Would like more information regarding Prolia. Sent [facility] scheduler staff message to schedule

## 2023-02-20 ENCOUNTER — Ambulatory Visit: Payer: Medicare Other

## 2023-02-26 ENCOUNTER — Telehealth: Payer: Self-pay

## 2023-02-26 NOTE — Telephone Encounter (Signed)
Called Pt no answer, LVM to schedule appt with MD to discuss Prolia and tx options

## 2023-08-10 LAB — CBC WITH DIFF, BLOOD
Abs Basophils: 11 {cells}/uL (ref 0–200)
Abs Eosinophils: 22 {cells}/uL (ref 15–500)
Abs Lymphs: 1771 {cells}/uL (ref 850–3900)
Abs Monocytes: 351 {cells}/uL (ref 200–950)
Abs Neutrophils: 3245 {cells}/uL (ref 1500–7800)
Basophils: 0.2 %
Eosinophils: 0.4 %
HCT: 33.8 % — ABNORMAL LOW (ref 35.0–45.0)
HGB: 11 g/dL — ABNORMAL LOW (ref 11.7–15.5)
Lymps: 32.8 %
MCH: 31.2 pg (ref 27.0–33.0)
MCHC: 32.5 g/dL (ref 32.0–36.0)
MCV: 95.8 fL (ref 80.0–100.0)
MPV: 9.9 fL (ref 7.5–12.5)
Monocytes: 6.5 %
PLT: 165 10*3/uL (ref 140–400)
RBC: 3.53 10*6/uL — ABNORMAL LOW (ref 3.80–5.10)
RDW: 12 % (ref 11.0–15.0)
SEGS: 60.1 %
WBC: 5.4 10*3/uL (ref 3.8–10.8)

## 2023-08-10 LAB — COMPREHENSIVE METABOLIC PANEL, BLOOD
ALT (SGPT): 9 U/L (ref 6–29)
AST (SGOT): 15 U/L (ref 10–35)
Albumin/Glob Ratio: 1.8 (ref 1.0–2.5)
Albumin: 4.1 g/dL (ref 3.6–5.1)
Alkaline Phos: 77 U/L (ref 37–153)
BUN/Creatinine Ratio: 22 (ref 6–22)
BUN: 27 mg/dL — ABNORMAL HIGH (ref 7–25)
Bilirubin, Total: 1.1 mg/dL (ref 0.2–1.2)
Calcium: 9 mg/dL (ref 8.6–10.4)
Carbon Dioxide: 19 mmol/L — ABNORMAL LOW (ref 20–32)
Chloride: 109 mmol/L (ref 98–110)
Creatinine: 1.21 mg/dL — ABNORMAL HIGH (ref 0.60–1.00)
EGFR: 46 mL/min/{1.73_m2} — ABNORMAL LOW (ref >=60)
Globulin: 2.3 g/dL (ref 1.9–3.7)
Glucose: 91 mg/dL (ref 65–99)
Potassium: 5.9 mmol/L — ABNORMAL HIGH (ref 3.5–5.3)
Sodium: 138 mmol/L (ref 135–146)
Total Protein: 6.4 g/dL (ref 6.1–8.1)

## 2023-08-10 LAB — VITAMIN B12, BLOOD: Vitamin B12: 657 pg/mL (ref 200–1100)

## 2023-08-10 LAB — RETICULOCYTES AUTOMATED, BLOOD
Retic %, Auto: 1.4 %
Retic Absolute: 49420 {cells}/uL (ref 20000–80000)

## 2023-08-10 LAB — IRON, TIBC AND FERRITIN PANEL - QUEST
Ferritin: 25 ng/mL (ref 16–288)
IIBC: 372 ug/dL (ref 250–450)
Iron Saturation: 38 % (ref 16–45)
Iron: 140 ug/dL (ref 45–160)

## 2023-08-10 LAB — ~~LOC~~ 27.29, BLOOD: ~~LOC~~ 27.29: <10 U/mL (ref <38)

## 2023-08-15 ENCOUNTER — Encounter: Payer: Self-pay | Admitting: Hematology & Oncology

## 2023-08-15 ENCOUNTER — Ambulatory Visit (INDEPENDENT_AMBULATORY_CARE_PROVIDER_SITE_OTHER): Payer: Medicare Other | Admitting: Hematology & Oncology

## 2023-08-15 VITALS — BP 139/81 | HR 77 | Temp 97.9°F | Resp 16 | Ht 65.0 in | Wt 185.0 lb

## 2023-08-15 DIAGNOSIS — Z780 Asymptomatic menopausal state: Secondary | ICD-10-CM

## 2023-08-15 DIAGNOSIS — G629 Polyneuropathy, unspecified: Secondary | ICD-10-CM

## 2023-08-15 DIAGNOSIS — Z171 Estrogen receptor negative status [ER-]: Secondary | ICD-10-CM

## 2023-08-15 DIAGNOSIS — C50411 Malignant neoplasm of upper-outer quadrant of right female breast: Secondary | ICD-10-CM

## 2023-08-15 NOTE — Patient Instructions (Addendum)
Orders Placed This Encounter   Procedures    Digital Diagnostic Mammogram Bilateral    MRI BREAST BILATERAL W/WO CONTRAST    CBC w/ Diff Lavender    Comprehensive Metabolic Panel    [place] 27.29, BLOOD     Please have your Labs done at least 1 to 2 week(s) prior to your appointment.

## 2023-08-15 NOTE — Progress Notes (Addendum)
Kayla Joseph A. Kayla Merlin, MD, [facility]  Health Sciences Clinical Professor  Division of Hematology/Oncology, Department of Medicine  Medical Director, [facility]  [facility]  [facility]  [address]  [place], [place] [address]  Tel: [phone]  Fax: [phone]     HEMATOLOGY ONCOLOGY FOLLOW UP NOTE     Date of Service:   08/15/2023     Kayla Joseph, DOB 1946/10/07    REFERRING MD: Liston Alba  Radiation oncology: Mayra Reel  PCP: Mickel Baas  GYN: Marva Panda   Cardiology: Aurelio Jew    CHIEF COMPLAINT: Routine follow-up visit for right breast cancer and melanoma    HISTORY OF PRESENT ILLNESS:    77 y/o Caucasian woman with mammographically detected clinical stage IIA T2N0 right breast invasive ductal carcinoma s/p bx  01/14/14 (SBR 9/9, ER/PR negative, Ki-67=50%, Her-2/neu FISH ratio=1.14 but copy number=6.6 so positive). MRI breast 01/29/14 showed mass to be 2.2 x 1.6 x 2.0 cm. Staging PET/CT 01/27/14 was negative for distant mets; showed only known right breast tumor and incidental left thyroid nodule. Planned neoadjuvant systemic therapy: TCH + P x 6 cycles followed by surgery and radiation therapy. Cycle #1 TCH+P given 02/17/14. Pt developed severe diarrhea and dehydration causing electrolyte derangements requiring daily replacement therapies and hydration.  Therefore, chemotherapy was changed to weekly Taxol for 12 weeks and Herceptin plus Perjeta every 3 weeks was continued. Taxol started 03/10/14 and completed only 8 weeks on 04/28/14 due to cumulative severe side effects and MRI breast 04/30/14 showing only 4mm of residual enhancement. Herceptin/Perjeta continued q3wks for 6 total cycles. Right lumpectomy + SLNB done 05/28/14 (6 mm residual IDC, grade 2, SBR 7/9, no LI, 0/1 LN; ypT1bN0; residual tumor had lower a mitotic score compared to initial biopsy specimen likely due to chemotherapy effect;  repeat ER and PR on residual tumor was again negative for both). Due to residual disease although minimal, pt received final dose (#6)  of Perjeta 06/02/14. Herceptin single-agent q3wks started 06/23/14.     July 14, 2014 (Herceptin week #22)     Started single-agent Herceptin 06/23/14 and tolerated it well without side effects. Hair is starting to grow.  Neuropathy in the fingers and bottoms of the feet continue but improve when she takes gabapentin 300 mg 3 times a day.  She does not want to increase the dose since it is controlling her neuropathy and she does not want to increase fatigue due to medication side effect.  She just returned from her trip to [place]. Diarrhea continues but it was improved with more solid form [place]  The only difference during that trip was that her friends were cooking all of her meals for her so a change in diet seems to have improved the diarrhea.  Since her return, her diarrhea has resumed unchanged.  She has about 6 watery episodes of diarrhea per day and has had 3 episodes thus far this morning.  She continues Percocet as needed but also has a prior prescription for more tincture of opium which she will change back to since postop pain has resolved.  She has not yet done repeat stool studies since she was on her trip but will do so soon with prior order.  She has not seen the gastroenterologist yet but will ask her primary care doctor for a referral to see one soon.  Dysuria symptoms recurred this past  weekend.  Bactrim worked well in the past after the 1st dose. She saw Dr Mayra Reel 06/25/14 and will start xrt next week on 07/21/14.  She receives hydration every 3 weeks with Herceptin but requests more frequent visits since her diarrhea is ongoing. Labs (CBC, CMP) 06/02/14 sig for H/H=10.6/32.9; gluc=221.     The patient completed the Herceptin every 3 weeks for total of 1 year of adjuvant therapy in 12-2014 and remains off therapy.     INTERVAL  HISTORY:    The patient is here to review the repeat imaging.  She continues to have discomfort in the right axilla.  She was recommended to have a breast MRI.  She is followed by Dr. Jori Moll of GI and has completed colonoscopy but does not recall doing an upper GI endoscopy and capsule endoscopy.  She denies any visible bleeding.  She has been taking B12 but an interrupted it was found to be low on B12 again and now has resumed the oral B12 intake at 1000 mcg daily.  She stopped the Fosamax and is asking for a medication that is stronger.  She is followed by the dentist.  She is aware for borderline elevated creatinine in his monitored by PCP and is trying to stay well hydrated.    02-12-2023 Patient obtained dental clearance and may proceed to Prolia.   Patient denies any new breast related problems.    08-15-2023 The patient is here to review the lab work.  She is asking for repeat orders and denies any new breast related issues.  She has not seen Nephrology.  Denies any new medication changes.    11-20-2019 1st vaccine COVID19 Pfizer at [employer]  12-11-2019 2nd vaccine COVID19 Pfizer at [employer]        PAST MEDICAL HISTORY:      Reviewed and no changes.    Stage IIA T2N0 right breast IDC --> ypT1bN0   LUE DVT dx'ed by U/S 02/10/14 -- Lovenox started same day; resolved on 05/18/14 U/S; completed Lovenox 06/07/14   Stage I pT1aN0 melanoma of upper back s/p excision  DM II  Hyperlipidemia  Hypertension  BCC skin  Thyroid nodules bilaterally   She had a colonoscopy in 2018 and to be repeated in 2023.               PAST SURGICAL HISTORY:      Reviewed and no changes.    05/28/14        Right lumpectomy + SLNB   02/05/14      L. chest port placement ([facility])  07/04/11      Wide excision upper back melanoma + R. supraclavicular SLNB (lentigo melanoma and melanoma in situ, 0.75 mm, no residual invasive melanoma, 0/1 LN)   2013         Facelift  2012         Gastric bypass  1999         Cholecystectomy  1995         Left foot  fracture repair with pins  Age 13      Tonsillectomy        PAST OB/GYN HISTORY:      Reviewed and no changes.      G0P0. Menarche at 48. OCP x 15 yrs; last age 31s. HRT late 68s for <14mths; stopped after [facility] study results reported. LMP age 56.        HEALTH MAINTENANCE:    Colo -- 3-4 yrs ago;  repeat due next year   DXA -- 2015 osteopenia; prescribed Fosamax but never started   Pap -- 11/2013 (one prior abnl pap and polyp removed)        FAMILY HISTORY:    Reviewed and no changes.    Mother -- died 9 of CHF  Father -- died 60 of decline after stroke  Sister -- died 83 of NHL; another sister alive and well     SOCIAL HISTORY:                Reviewed and no changes    Lives with partner, Angelena Form.   Kids -- none   Retired Theme park manager.   Tobacco -- only 4 yrs in early 64s,  <1ppd  Alcohol - 1 glass wine/mth at most   Drugs - none          CURRENT MEDS:    I reviewed the medication list.     ALLERGIES:     She requested the walnut allergy to be removed and reports no known drug allergies.    REVIEW OF SYSTEMS (ROS):    A comprehensive 15-point ROS was performed and reviewed with the patient and is negative unless noted above.       EXAM:        01/01/2023     8:20 AM 01/01/2023     8:40 AM 01/01/2023     8:43 AM 02/12/2023     8:30 AM 02/12/2023     8:33 AM 08/15/2023     8:41 AM 08/15/2023     8:42 AM   Vitals   Taken/Reported  In Clinic  In Clinic  In Clinic    Systolic 125   128  139    Diastolic 74   67  81    Position Sitting   Sitting  Sitting    Site Right arm   Right arm  Left arm    Cuff Size Regular   Regular  Regular    Pulse 101   84  77    Resp 20   18  16     Temp 97.7 F (36.5 C)   97.8 F (36.6 C)  97.9 F (36.6 C)    Temp Source Temporal   Temporal  Temporal    SpO2      100 %    Weight (kg) 80.15 kg   82.25 kg  83.915 kg    Weight (lbs) 176 lb 11.2 oz   181 lb 5.3 oz  185 lb    Height (cm)    165.1 cm  165.1 cm    Height (ft in)    5\' 5"   5\' 5"     BMI (kg/m2)    30.17 kg/m2  30.79 kg/m2     BSA (m2)    1.94 m2  1.96 m2    Fall Risk   No fall risk identified  No fall risk identified  No fall risk identified      GENERAL: The patient is well developed and well nourished, ambulatory, in no acute distress.  HEENT: normocephalic/atraumatic, anicteric sclera.  HEART: RRR. No murmurs.  CHEST: CTA bilaterally.  AXILLAE:  Palpable fullness in the right axilla.  No suspicious findings in the left axilla.  BREASTS:  Bilaterally examined and overall no new findings.  ABDOMEN: soft, non-tender, non-distended, normal bowel sounds, no rebound or guarding, no appreciable masses.  EXTREMITIES: Trace edema of the lower extremities.  SKIN:  No rash or  jaundice.  NEURO: AOx3,  no focal deficits.   PSYCHIATRIC:  Good insight adequate mood and affect.    LAB DATA:    I reviewed the lab work:     Latest Reference Range & Units 12/18/22 08:14 02/04/23 07:30 08/09/23 09:52   WBC 3.8 - 10.8 Thousand/uL  4.7 5.4   RBC 3.80 - 5.10 Million/uL  3.47 (L) 3.53 (L)   HGB 11.7 - 15.5 g/dL  16.1 (L) 09.6 (L)   HCT 35.0 - 45.0 %  32.1 (L) 33.8 (L)   MCV 80.0 - 100.0 fL  92.5 95.8   MCH 27.0 - 33.0 pg  30.0 31.2   MCHC 32.0 - 36.0 g/dL  04.5 40.9   RDW 81.1 - 15.0 %  13.2 12.0   PLT 140 - 400 Thousand/uL  182 165   MPV 7.5 - 12.5 fL  10.3 9.9   SEGS %  44.7 60.1   Lymps %  45.6 32.8   Monocytes %  9.1 6.5   Eosinophils %  0.4 0.4   Basophils %  0.2 0.2   BANDS %  NOT AVAILABLE NOT AVAILABLE   Metamyelocytes %  NOT AVAILABLE NOT AVAILABLE   Myelocytes %  NOT AVAILABLE NOT AVAILABLE   Promyelocytes %  NOT AVAILABLE NOT AVAILABLE   Blasts %  NOT AVAILABLE NOT AVAILABLE   Reactive Lymphs 0 - 10 %  NOT AVAILABLE NOT AVAILABLE   NRBC 0 /100 WBC  NOT AVAILABLE NOT AVAILABLE   Abs Neutrophils 1,500 - 7,800 cells/uL  2,101 3,245   Abs Lymphs 850 - 3,900 cells/uL  2,143 1,771   Abs Monocytes 200 - 950 cells/uL  428 351   Abs Eosinophils 15 - 500 cells/uL  19 22   Abs Basophils 0 - 200 cells/uL  9 11   Abs Band Neutrophils 0 - 750 cells/uL  NOT  AVAILABLE NOT AVAILABLE   Abs Metamyelocytes 0 cells/uL  NOT AVAILABLE NOT AVAILABLE   Abs Myelocytes 0 cells/uL  NOT AVAILABLE NOT AVAILABLE   Abs Promyelocytes 0 cells/uL  NOT AVAILABLE NOT AVAILABLE   Abs Blasts 0 cells/uL  NOT AVAILABLE NOT AVAILABLE   Abs NRBC 0 cells/uL  NOT AVAILABLE NOT AVAILABLE   Comments   NOT AVAILABLE NOT AVAILABLE   Retic %, Auto % 1.0 1.6 1.4   Retic Absolute 20,000 - 80,000 cells/uL 33,800 55,520 49,420      Latest Reference Range & Units 12/18/22 08:14 02/04/23 07:30 08/09/23 09:52   Sodium 135 - 146 mmol/L 136 138 138   Potassium 3.5 - 5.3 mmol/L 4.3 4.9 5.9 (H)   Chloride 98 - 110 mmol/L 106 107 109   Carbon Dioxide 20 - 32 mmol/L 20 23 19  (L)   BUN 7 - 25 mg/dL 28 (H) 31 (H) 27 (H)   Creatinine 0.60 - 1.00 mg/dL 9.14 (H) 7.82 (H) 9.56 (H)   Glucose 65 - 99 mg/dL 92 213 (H) 91   Calcium 8.6 - 10.4 mg/dL 8.7 8.8 9.0   Total Protein 6.1 - 8.1 g/dL 6.8 6.4 6.4   Alkaline Phos 37 - 153 U/L 96 78 77   AST (SGOT) 10 - 35 U/L 13 13 15    ALT (SGPT) 6 - 29 U/L 7 9 9    Albumin 3.6 - 5.1 g/dL 4.3 4.1 4.1   Bilirubin, Total 0.2 - 1.2 mg/dL 0.8 1.2 1.1   BUN/Creatinine Ratio 6 - 22 (calc) 20 24 (H) 22   [place] 27.29 <38 U/mL <10 <  10 <10   Ferritin 16 - 288 ng/mL 4 (L) 15 (L) 25   Globulin 1.9 - 3.7 g/dL (calc) 2.5 2.3 2.3   Albumin/Glob Ratio 1.0 - 2.5 (calc) 1.7 1.8 1.8   Iron 45 - 160 mcg/dL 60 161 096   IIBC 045 - 450 mcg/dL (calc) 409 (H) 811 914   Iron Saturation 16 - 45 % (calc) 12 (L) 37 38   Vitamin B12 200 - 1,100 pg/mL 193 (L) 281 657     IMAGING DATA:    Reviewed the imaging with patient:    01-12-2023 Breast MRI showed Impression    1. Right breast: BI-RADS Category 2 - Benign. Status post lumpectomy. No MR evidence of malignancy. Clinical follow-up is recommended for the palpable area in the right axilla.  2. Left breast: BI-RADS Category 1 - Negative. No MR evidence of malignancy.  3.  Recommend routine annual mammography in 11 months.    OVERALL ASSESSMENT: BI-RADS Category  2.    12-14-2022 Breast mammogram and US showed Impression    1.  Stable post-surgical scar in the right breast is benign. Note that distortion from scarring can obscure underlying lesions.  2.  Vague hypoechoic area in the right axilla requires additional evaluation. We will recall the patient for an MRI exam. This may relate to scar, however not with certainty. The patient states she only noticed the lump within the past year. Due to the intramuscular location, additional imaging is recommended.  3.  Fat lobule in the left breast at 2 o'clock is benign.    Findings and recommendations were discussed with the patient at the time of the exam by Dr. Billee Cashing. In addition, the patient's referring provider, Truett Mainland, was notified of the exam results by Dr. Billee Cashing by secure text message [facility] Text) on 12/14/2022.    ACR BI-RADS Category 0 -  Incomplete: Need Additional Imaging Evaluation.    06-08-2021 Mammogram showed IMPRESSION:   Stable post-lumpectomy scar in the right breast is benign. Note that distortion from scarring can obscure underlying lesions.     Suggest this patient return for her routine annual screening mammogram in 1 year.     ACR BI-RADS Category 2 -  Benign Finding.     02-25-2020 MRI right knee showed IMPRESSION:     1.  Attenuated posterior root of the lateral meniscus; query small tear.   2.  Focal low-grade chondromalacia with questionable chondral delamination, measuring 0.6 cm in the weightbearing lateral femoral condyle.   3.  Small Baker's cyst.     12-10-2019 DEXA showed osteoporosis is at T score -2.8.     07-01-2019 MRI of breasts showed     Result Impression   IMPRESSION:    1. Right breast: BI-RADS Category 4 - Suspicious. Recommend US-guided core needle biopsy lumpectomy scar at 11:00 location given nodular plateau enhancement seen at the superior aspect of the scar. Findings and recommendations discussed with the patient on 07/01/19 at 12:30 pm.    2. Left breast:  BI-RADS Category 1 - Negative.    OVERALL ASSESSMENT: BI-RADS Category 4.     05-26-2019 Mammogram showed   Result Impression     Area of distortion consistent with post surgical scarring in the left breast is benign. The findings and recommendations were discussed with the patient by Dr. Patsey Berthold at the conclusion of this examination.    Suggest this patient return for her routine annual screening mammogram in 1 year.  ACR BI-RADS Category 2 -  Benign Finding.     06-09-2018 Mammogram showed    Result Impression     Stable post lumpectomy scarring and radiation therapy change in the right breast is benign. Note that distortion from scarring can obscure underlying lesions.    Suggest this patient return for her routine annual screening mammogram in 1 year.    ACR BI-RADS Category 2 -  Benign Finding.     June 03, 2017 bone density shows osteopenia T-score of -2.4 in the left femoral neck.    06-03-2017 Mammogram showed Stable post lumpectomy scarring and radiation therapy change in the right breast is benign.    Suggest this patient return for her routine annual screening mammogram in 1 year.    ACR BI-RADS Category 2 -  Benign Finding.    05/24/2016 bilateral diagnostic 2D and 3D mammogram shows stable post lumpectomy changes in the right breast are benign.  December 27, 2015 echocardiogram shows a normal ejection fraction.  05-24-2015 bilateral diagnostic mammogram and bilateral breast ultrasound showed new post lumpectomy scar in the right breast is benign, stable punctate calcifications in the area areolar region of the right breast are benign, stable punctate calcifications in the left breast at 6 o'clock are benign with no evidence of malignancy.  11/22/2014 bilateral 2D and 3D mammogram shows new punctate calcifications in the periareolar region of the right breast are probably benign in mammographic follow-up in 6 months is recommended, new post lumpectomy scar in the right breast is benign. New punctate  calcifications in the left breast at 6 o'clock up probably benign.  11/22/2014 bone density shows osteoporosis with a T-score of-2.6 in the right femoral neck.  August 31, 2014 echocardiogram shows left ventricular diastolic dysfunction, normal left ventricular size and systolic function.    PATHOLOGY:    I reviewed the report with her:    07/07/2019 RIGHT BREAST, 11:00 AT 7 CM, CORE BIOPSY:  Fat necrosis and scar, consistent with prior procedure site changes.  No carcinoma identified.    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  09/27/2014 left thyroid nodule atypical follicular lesion of undetermined significance, a right thyroid nodule fine-needle aspiration negative for malignancy.    IMPRESSION/PLAN:    Problem # 1:  BREAST CANCER      Initial clinical stage IIA pT2N0 right IDC s/p bx 01/14/14 (ER/PR negative, Her-2 positive by copy number = 6.6, Ki-67=50%). MRI breasts 01/29/14 showed lesion to be 2.2 x 1.6 x 2.0 cm. Staging PET/CT negative for distant mets. Neoadjuvant chemotherapy with Her-2 targeted therapy was suggested, since tumor size > 2 cm, so recommended TCH + P (Taxotere, Carboplatin, Herceptin, Perjeta) q3wks x 6 cycles. Systemic therapies started 02/17/14 but pt developed severe diarrhea causing dehydration and electrolyte abnormalities requiring daily hydration and electrolyte replacements so changed regimen to weekly Taxol x 12 weeks with Herceptin/Perjeta q3wks and tolerated only slightly better. Right breast mass resolved on physical exam. Pt was becoming weaker due to ongoing diarrhea on Taxol so discontinued after 04/28/14 dose with plan for repeat breast imaging followed by surgery if response was good.  Breast imaging 04/28/14 (mammo, u/s) and MRI 04/30/14 showed minimal residual disease so Taxol was discontinued after 04/28/14 dose (week #8) and  Herceptin/Perjeta continued. Right lumpectomy + SLNB 05/28/14 showed 6 mm residual disease, grade 2, SBR=7/9 (lower mitotic rate than bx),  0/1 LN; repeat ER/PR negative. Since only 5 doses of Perjeta were given pre-operatively and there was residual disease in surgical specimen, she was given the final 6th dose of Perjeta 06/02/14 with usual q3wk dose of Herceptin. Single-agent Herceptin continued q3wk and completed a 1-yr course in 12-2014 and since then no therapy. The radiation therapy was completed.  We reviewed the most recent findings in the right axilla on imaging and we ordered a breast MRI and reviewed it. She was advised to stay well hydrated since she will be given gadolinium.  A biopsy is not necessary at this time.  No new concerns on today's breast exam.  She remains off active therapy.  Continue to monitor.  All of her questions were answered to her satisfaction.    Problem # 2:  ACUTE DVT OF UPPER EXTREMITY      LUE acute DVT dx'ed by u/s 02/10/14 due to sudden onset of left arm swelling, completed anticoagulation and no evidence of recurrent thrombosis. May consider to remove PORT was concerned about chance to develop DVT but advised that removing the PORT does not usually cause a DVT.    Problem # 3:  ABNORMAL ECHO      Baseline ECHO 02/10/14 with normal EF of 65-70% but with mild diastolic dysfunction. I advised her to continue  f/u with Dr. Vernice Jefferson for monitoring of cardiac function and recommended again a repeat ECHO and will retrieve the report from 2019.    Problem # 4: THYROID NODULE             She had a total thyroidectomy and no malignancy found.  Follow-up with Endocrinology or now PCP.    PROBLEM # 5 OSTEOPOROSIS    We discussed that she stopped Fosamax for osteoporosis and we discussed Prolia or Reclast.  She needs to obtain dental clearance and monitor for osteonecrosis of the jaw.  She needs to continue weight-bearing exercises, fall precautions, vitamin-D and calcium.  DEXA to be repeated  every 2 years.    PROBLEM # 6 MELANOMA    To be followed by Dermatology and no new reported lesions.    Because she has anemia, and diabetes, and an elevated creatinine, she was advised to be followed by PCP and or Nephrology and continue to make an effort to stay well hydrated.  This is particularly important since she is considering intermittent imaging that requires contrast, that may affect the kidney.  Her creatinine is slightly improved.    COVID-19 Vaccination   Guide for [employer] with Cancer    COVID-19 vaccination remains the most effective way to prevent SARS-CoV-2 infection and should be considered the first line of prevention. The COVID-19 Treatment Guidelines Panel (the Panel) recommends COVID-19 vaccination as soon as possible for everyone who is eligible, including patients with active cancer and patients receiving treatment for cancer.     Adapted from Cancer  COVID-19 Treatment Guidelines (http://www.myers.net/)      PLAN:    1.  Completed single-agent Herceptin q3wks to complete 1 yr total course (06/23/14 thru 01/19/15).  Repeat imaging was ordered. So far no evidence for disease recurrence.  Repeat breast imaging was ordered.  2. The patient has evidence of iron-deficiency anemia and she is postmenopausal.  Apparently she had a colonoscopy.  She was advised to see her gastroenterologist and consider an upper GI endoscopy and capsule endoscopy to complete the GI workup.  3.  Osteoporosis and we discussed Prolia or Reclast.  She was advised to discuss  this with her dentist.  She obtain that the clearance.  Next DEXA in August of 2025.  4. Thyroidectomy done due to atypical left thyroid nodule and no malignancy, and continue to follow up with endocrinology.  5. Continues to have renal insufficiency and now hyperkalemia and was advised to contact her PCP and consider Nephrology consultation and address it.  6. History of malignant melanoma and followed by Dermatology and no new reported skin lesions    Orders Placed  This Encounter   Procedures    Digital Diagnostic Mammogram Bilateral    MRI BREAST BILATERAL W/WO CONTRAST    CBC w/ Diff [place]    Comprehensive Metabolic Panel    [place] 27.29, BLOOD      Thank you very much for allowing our continued participation in the care of this patient.     Medical decision-making was highly complex  and more than 50% of this _40_ min visit was spent in educating the patient about their condition, discussing compliance issues, counseling including answering all of the patients questions and coordination of care. This included review of relevant laboratory, radiology and other diagnostic tests, explaining medical management choices and the patient verbalized understanding.                                                                                                                                               RTO:   6 months    I certify that I have reviewed the documentation contained in this clinical record and that it is accurately recorded.  This document contains private and confidential health information protected by state and federal law and any release of this information requires the written prior authorization of the above mentioned patient.    Please note this report was dictated with the use of voice recognition software.  It may contain inadvertent spelling or grammatical errors which were not detected during the editing process.       Take a virtual tour of our facility: [email or web address]          --------------------------------------  Truett Mainland, MD, MBA

## 2023-08-21 ENCOUNTER — Ambulatory Visit: Payer: Medicare Other

## 2024-02-18 LAB — CBC W/ DIFF, BLOOD - ~~LOC~~
Abs Basophils: 13 {cells}/uL (ref 0–200)
Abs Eosinophils: 53 {cells}/uL (ref 15–500)
Abs Lymphs: 2270 {cells}/uL (ref 850–3900)
Abs Monocytes: 416 {cells}/uL (ref 200–950)
Abs Neutrophils: 3848 {cells}/uL (ref 1500–7800)
Basophils: 0.2 %
Eosinophils: 0.8 %
HCT: 35.7 % (ref 35.0–45.0)
HGB: 11.6 g/dL — ABNORMAL LOW (ref 11.7–15.5)
Lymps: 34.4 %
MCH: 30.7 pg (ref 27.0–33.0)
MCHC: 32.5 g/dL (ref 32.0–36.0)
MCV: 94.4 fL (ref 80.0–100.0)
MPV: 9.8 fL (ref 7.5–12.5)
Monocytes: 6.3 %
PLT: 194 10*3/uL (ref 140–400)
RBC: 3.78 10*6/uL — ABNORMAL LOW (ref 3.80–5.10)
RDW: 11.8 % (ref 11.0–15.0)
SEGS: 58.3 %
WBC: 6.6 10*3/uL (ref 3.8–10.8)

## 2024-02-18 LAB — COMPREHENSIVE METABOLIC PANEL, BLOOD - ~~LOC~~
ALT (SGPT): 9 U/L (ref 6–29)
AST (SGOT): 12 U/L (ref 10–35)
Albumin/Glob Ratio: 2 (calc) (ref 1.0–2.5)
Albumin: 4.3 g/dL (ref 3.6–5.1)
Alkaline Phos: 86 U/L (ref 37–153)
BUN/Creatinine Ratio: 23 (calc) — ABNORMAL HIGH (ref 6–22)
BUN: 28 mg/dL — ABNORMAL HIGH (ref 7–25)
Bilirubin, Total: 1.3 mg/dL — ABNORMAL HIGH (ref 0.2–1.2)
Calcium: 9.2 mg/dL (ref 8.6–10.4)
Carbon Dioxide: 23 mmol/L (ref 20–32)
Chloride: 102 mmol/L (ref 98–110)
Creatinine: 1.24 mg/dL — ABNORMAL HIGH (ref 0.60–1.00)
EGFR: 44 mL/min/{1.73_m2} — ABNORMAL LOW (ref >=60)
Globulin: 2.2 g/dL (ref 1.9–3.7)
Glucose: 106 mg/dL — ABNORMAL HIGH (ref 65–99)
Potassium: 5.6 mmol/L — ABNORMAL HIGH (ref 3.5–5.3)
Sodium: 134 mmol/L — ABNORMAL LOW (ref 135–146)
Total Protein: 6.5 g/dL (ref 6.1–8.1)

## 2024-02-18 LAB — ~~LOC~~ 27.29, BLOOD - ~~LOC~~: ~~LOC~~ 27.29: <10 U/mL (ref <38)

## 2024-02-19 ENCOUNTER — Ambulatory Visit (INDEPENDENT_AMBULATORY_CARE_PROVIDER_SITE_OTHER): Payer: Medicare Other | Admitting: Hematology & Oncology

## 2024-02-19 ENCOUNTER — Encounter: Payer: Self-pay | Admitting: Hematology & Oncology

## 2024-02-19 VITALS — BP 128/82 | HR 69 | Temp 98.1°F | Resp 18 | Ht 65.0 in | Wt 181.0 lb

## 2024-02-19 DIAGNOSIS — C4361 Malignant melanoma of right upper limb, including shoulder: Secondary | ICD-10-CM

## 2024-02-19 DIAGNOSIS — Z171 Estrogen receptor negative status [ER-]: Secondary | ICD-10-CM

## 2024-02-19 DIAGNOSIS — Z780 Asymptomatic menopausal state: Secondary | ICD-10-CM

## 2024-02-19 DIAGNOSIS — C50411 Malignant neoplasm of upper-outer quadrant of right female breast: Secondary | ICD-10-CM

## 2024-02-19 NOTE — Progress Notes (Signed)
 Sebastien Jackson A. Vallorie Gayer, MD, [facility]  Health Sciences Clinical Professor  Division of Hematology/Oncology, Department of Medicine  Medical Director, [facility]  [facility]  [facility]  [address], [address]  [place], [place] [address]  Tel: [phone]  Fax: [phone]     HEMATOLOGY ONCOLOGY FOLLOW UP NOTE     Date of Service:   02/19/2024     Gwen Wogoman, DOB 04-13-1946    REFERRING MD: Quilla Brunswick  Radiation oncology: Adalberto Hollow  PCP: Marcellus Sers  GYN: Jabier Martens   Cardiology: Earnstine Golas    CHIEF COMPLAINT: Routine follow-up visit for right breast cancer and melanoma    HISTORY OF PRESENT ILLNESS:    78 y/o Caucasian woman with mammographically detected clinical stage IIA T2N0 right breast invasive ductal carcinoma s/p bx  01/14/14 (SBR 9/9, ER/PR negative, Ki-67=50%, Her-2/neu FISH ratio=1.14 but copy number=6.6 so positive). MRI breast 01/29/14 showed mass to be 2.2 x 1.6 x 2.0 cm. Staging PET/CT 01/27/14 was negative for distant mets; showed only known right breast tumor and incidental left thyroid nodule. Planned neoadjuvant systemic therapy: TCH + P x 6 cycles followed by surgery and radiation therapy. Cycle #1 TCH+P given 02/17/14. Pt developed severe diarrhea and dehydration causing electrolyte derangements requiring daily replacement therapies and hydration.  Therefore, chemotherapy was changed to weekly Taxol for 12 weeks and Herceptin plus Perjeta every 3 weeks was continued. Taxol started 03/10/14 and completed only 8 weeks on 04/28/14 due to cumulative severe side effects and MRI breast 04/30/14 showing only 4mm of residual enhancement. Herceptin/Perjeta continued q3wks for 6 total cycles. Right lumpectomy + SLNB done 05/28/14 (6 mm residual IDC, grade 2, SBR 7/9, no LI, 0/1 LN; ypT1bN0; residual tumor had lower a mitotic score compared to initial biopsy specimen likely due to chemotherapy effect;  repeat ER and PR on residual tumor was again negative for both). Due to residual disease although minimal, pt received final dose (#6)  of Perjeta 06/02/14. Herceptin single-agent q3wks started 06/23/14.     July 14, 2014 (Herceptin week #22)     Started single-agent Herceptin 06/23/14 and tolerated it well without side effects. Hair is starting to grow.  Neuropathy in the fingers and bottoms of the feet continue but improve when she takes gabapentin  300 mg 3 times a day.  She does not want to increase the dose since it is controlling her neuropathy and she does not want to increase fatigue due to medication side effect.  She just returned from her trip to [place]. Diarrhea continues but it was improved with more solid form during her [place] trip.  The only difference during that trip was that her friends were cooking all of her meals for her so a change in diet seems to have improved the diarrhea.  Since her return, her diarrhea has resumed unchanged.  She has about 6 watery episodes of diarrhea per day and has had 3 episodes thus far this morning.  She continues Percocet as needed but also has a prior prescription for more tincture of opium which she will change back to since postop pain has resolved.  She has not yet done repeat stool studies since she was on her trip but will do so soon with prior order.  She has not seen the gastroenterologist yet but will ask her primary care doctor for a referral to see one soon.  Dysuria symptoms recurred this past  weekend.  Bactrim worked well in the past after the 1st dose. She saw Dr Adalberto Hollow 06/25/14 and will start xrt next week on 07/21/14.  She receives hydration every 3 weeks with Herceptin but requests more frequent visits since her diarrhea is ongoing. Labs (CBC, CMP) 06/02/14 sig for H/H=10.6/32.9; gluc=221.     The patient completed the Herceptin every 3 weeks for total of 1 year of adjuvant therapy in 12-2014 and remains off therapy.     INTERVAL  HISTORY:    The patient is here to review the repeat imaging.  She continues to have discomfort in the right axilla.  She was recommended to have a breast MRI.  She is followed by Dr. Merril Abelson of GI and has completed colonoscopy but does not recall doing an upper GI endoscopy and capsule endoscopy.  She denies any visible bleeding.  She has been taking B12 but an interrupted it was found to be low on B12 again and now has resumed the oral B12 intake at 1000 mcg daily.  She stopped the Fosamax  and is asking for a medication that is stronger.  She is followed by the dentist.  She is aware for borderline elevated creatinine in his monitored by PCP and is trying to stay well hydrated.    02-12-2023 Patient obtained dental clearance and may proceed to Prolia .   Patient denies any new breast related problems.    08-15-2023 The patient is here to review the lab work.  She is asking for repeat orders and denies any new breast related issues.  She has not seen Nephrology.  Denies any new medication changes.    02-19-2024 She denies any new breast related issues.  She is waiting to see Nephrology because the increase in the creatinine.  Denies any new medications.    11-20-2019 1st vaccine COVID19 Pfizer at [place]  12-11-2019 2nd vaccine COVID19 Pfizer at [employer]        PAST MEDICAL HISTORY:      Reviewed and no changes.    Stage IIA T2N0 right breast IDC --> ypT1bN0   LUE DVT dx'ed by U/S 02/10/14 -- Lovenox started same day; resolved on 05/18/14 U/S; completed Lovenox 06/07/14   Stage I pT1aN0 melanoma of upper back s/p excision  DM II  Hyperlipidemia  Hypertension  BCC skin  Thyroid nodules bilaterally   She had a colonoscopy in 2018 and to be repeated in 2023.               PAST SURGICAL HISTORY:      Reviewed and no changes.    05/28/14        Right lumpectomy + SLNB   02/05/14      L. chest port placement ([facility])  07/04/11      Wide excision upper back melanoma + R. supraclavicular SLNB (lentigo melanoma and melanoma in  situ, 0.75 mm, no residual invasive melanoma, 0/1 LN)   2013         Facelift  2012         Gastric bypass  1999         Cholecystectomy  1995         Left foot fracture repair with pins  Age 53      Tonsillectomy        PAST OB/GYN HISTORY:      Reviewed and no changes.      G0P0. Menarche at 19. OCP x 15 yrs; last age 56s. HRT late  50s for <61mths; stopped after [facility] study results reported. LMP age 23.        HEALTH MAINTENANCE:    Colo -- 3-4 yrs ago; repeat due next year   DXA -- 2015 osteopenia; prescribed Fosamax  but never started   Pap -- 11/2013 (one prior abnl pap and polyp removed)        FAMILY HISTORY:    Reviewed and no changes.    Mother -- died 56 of CHF  Father -- died 71 of decline after stroke  Sister -- died 60 of NHL; another sister alive and well     SOCIAL HISTORY:                Reviewed and no changes    Lives with partner, Ashby Blackwater.   Kids -- none   Retired [employer] park manager.   Tobacco -- only 4 yrs in early 33s,  <1ppd  Alcohol - 1 glass wine/mth at most   Drugs - none          CURRENT MEDS:    I reviewed the medication list.    ALLERGIES:     She requested the walnut allergy to be removed and reports no known drug allergies.    REVIEW OF SYSTEMS (ROS):    A comprehensive 15-point ROS was performed and reviewed with the patient and is negative unless noted above.       EXAM:        01/01/2023     8:43 AM 02/12/2023     8:30 AM 02/12/2023     8:33 AM 08/15/2023     8:41 AM 08/15/2023     8:42 AM 02/19/2024     9:12 AM 02/19/2024     9:14 AM   Vitals   Taken/Reported  In Clinic  In Clinic  In Clinic    Systolic  128  139  128    Diastolic  67  81  82    Position  Sitting  Sitting  Sitting    Site  Right arm  Left arm  Left arm    Cuff Size  Regular  Regular  Regular    Pulse  84  77  69    Resp  18  16  18     Temp  97.8 F (36.6 C)  97.9 F (36.6 C)  98.1 F (36.7 C)    Temp Source  Temporal  Temporal  Temporal    SpO2    100 %  100 %    Weight (kg)  82.25 kg  83.915 kg  82.101 kg     Weight (lbs)  181 lb 5.3 oz  185 lb  181 lb    Height (cm)  165.1 cm  165.1 cm  165.1 cm    Height (ft in)  5\' 5"   5\' 5"   5\' 5"     BMI (kg/m2)  30.17 kg/m2  30.79 kg/m2  30.12 kg/m2    BSA (m2)  1.94 m2  1.96 m2  1.94 m2    Fall Risk No fall risk identified  No fall risk identified  No fall risk identified  No fall risk identified   Interventions       Oriented to room      GENERAL: The patient is well developed and well nourished, ambulatory, in no acute distress.  HEENT: normocephalic/atraumatic, anicteric sclera.  HEART: RRR. No murmurs.  CHEST: CTA bilaterally.  AXILLAE:  Palpable fullness in the right axilla.  No suspicious findings in the left axilla.  BREASTS:  Bilaterally examined and overall no new findings.  ABDOMEN: soft, non-tender, non-distended, normal bowel sounds, no rebound or guarding, no appreciable masses.  EXTREMITIES: Trace edema of the lower extremities.  SKIN:  No rash or jaundice.  NEURO: AOx3,  no focal deficits.   PSYCHIATRIC:  Good insight adequate mood and affect.    LAB DATA:    I reviewed the lab work:     Latest Reference Range & Units 12/18/22 08:14 02/04/23 07:30 08/09/23 09:52 02/17/24 11:17   Sodium 135 - 146 mmol/L 136 138 138 134 (L)   Potassium 3.5 - 5.3 mmol/L 4.3 4.9 5.9 (H) 5.6 (H)   Chloride 98 - 110 mmol/L 106 107 109 102   Carbon Dioxide 20 - 32 mmol/L 20 23 19  (L) 23   BUN 7 - 25 mg/dL 28 (H) 31 (H) 27 (H) 28 (H)   Creatinine 0.60 - 1.00 mg/dL 9.62 (H) 9.52 (H) 8.41 (H) 1.24 (H)   Glucose 65 - 99 mg/dL 92 324 (H) 91 401 (H)   Calcium  8.6 - 10.4 mg/dL 8.7 8.8 9.0 9.2   Total Protein 6.1 - 8.1 g/dL 6.8 6.4 6.4 6.5   Alkaline Phos 37 - 153 U/L 96 78 77 86   AST (SGOT) 10 - 35 U/L 13 13 15 12    ALT (SGPT) 6 - 29 U/L 7 9 9 9    Albumin 3.6 - 5.1 g/dL 4.3 4.1 4.1 4.3   Bilirubin, Total 0.2 - 1.2 mg/dL 0.8 1.2 1.1 1.3 (H)   BUN/Creatinine Ratio 6 - 22 (calc) 20 24 (H) 22 23 (H)    27.29 <38 U/mL <10 <10 <10 <10   Ferritin 16 - 288 ng/mL 4 (L) 15 (L) 25    Globulin 1.9 - 3.7  g/dL (calc) 2.5 2.3 2.3 2.2   Albumin/Glob Ratio 1.0 - 2.5 (calc) 1.7 1.8 1.8 2.0   Iron  45 - 160 mcg/dL 60 027 253    IIBC 664 - 450 mcg/dL (calc) 403 (H) 474 259    Iron  Saturation 16 - 45 % (calc) 12 (L) 37 38    Vitamin B12 200 - 1,100 pg/mL 193 (L) 281 657    WBC 3.8 - 10.8 Thousand/uL  4.7 5.4 6.6   RBC 3.80 - 5.10 Million/uL  3.47 (L) 3.53 (L) 3.78 (L)   HGB 11.7 - 15.5 g/dL  56.3 (L) 87.5 (L) 64.3 (L)   HCT 35.0 - 45.0 %  32.1 (L) 33.8 (L) 35.7   MCV 80.0 - 100.0 fL  92.5 95.8 94.4   MCH 27.0 - 33.0 pg  30.0 31.2 30.7   MCHC 32.0 - 36.0 g/dL  32.9 51.8 84.1   RDW 66.0 - 15.0 %  13.2 12.0 11.8   PLT 140 - 400 Thousand/uL  182 165 194   MPV 7.5 - 12.5 fL  10.3 9.9 9.8   SEGS %  44.7 60.1 58.3   Lymps %  45.6 32.8 34.4   Monocytes %  9.1 6.5 6.3   Eosinophils %  0.4 0.4 0.8   Basophils %  0.2 0.2 0.2   BANDS %  NOT AVAILABLE NOT AVAILABLE    Metamyelocytes %  NOT AVAILABLE NOT AVAILABLE    Myelocytes %  NOT AVAILABLE NOT AVAILABLE    Promyelocytes %  NOT AVAILABLE NOT AVAILABLE    Blasts %  NOT AVAILABLE NOT AVAILABLE    Reactive Lymphs 0 - 10 %  NOT  AVAILABLE NOT AVAILABLE    NRBC 0 /100 WBC  NOT AVAILABLE NOT AVAILABLE    Abs Neutrophils 1,500 - 7,800 cells/uL  2,101 3,245 3,848   Abs Lymphs 850 - 3,900 cells/uL  2,143 1,771 2,270   Abs Monocytes 200 - 950 cells/uL  428 351 416   Abs Eosinophils 15 - 500 cells/uL  19 22 53   Abs Basophils 0 - 200 cells/uL  9 11 13    Abs Band Neutrophils 0 - 750 cells/uL  NOT AVAILABLE NOT AVAILABLE    Abs Metamyelocytes 0 cells/uL  NOT AVAILABLE NOT AVAILABLE    Abs Myelocytes 0 cells/uL  NOT AVAILABLE NOT AVAILABLE    Abs Promyelocytes 0 cells/uL  NOT AVAILABLE NOT AVAILABLE    Abs Blasts 0 cells/uL  NOT AVAILABLE NOT AVAILABLE    Abs NRBC 0 cells/uL  NOT AVAILABLE NOT AVAILABLE    Comments   NOT AVAILABLE NOT AVAILABLE    Retic %, Auto % 1.0 1.6 1.4    Retic Absolute 20,000 - 80,000 cells/uL 33,800 55,520 49,420      IMAGING DATA:    Reviewed the imaging with  patient:    02/06/2024 Mammogram showed Impression    Stable post-lumpectomy scar in the right breast is benign.    Suggest this patient return for her routine annual screening mammogram in 1 year.    ACR BI-RADS Category 2 -  Benign Finding.    Life-time breast cancer risk (Modified v8 Tyrer-Cuzick/IBIS): Not applicable. The accuracy of the IBIS risk model is limited in individuals with a personal history of breast cancer.    01-12-2023 Breast MRI showed Impression    1. Right breast: BI-RADS Category 2 - Benign. Status post lumpectomy. No MR evidence of malignancy. Clinical follow-up is recommended for the palpable area in the right axilla.  2. Left breast: BI-RADS Category 1 - Negative. No MR evidence of malignancy.  3.  Recommend routine annual mammography in 11 months.    OVERALL ASSESSMENT: BI-RADS Category 2.    12-14-2022 Breast mammogram and US  showed Impression    1.  Stable post-surgical scar in the right breast is benign. Note that distortion from scarring can obscure underlying lesions.  2.  Vague hypoechoic area in the right axilla requires additional evaluation. We will recall the patient for an MRI exam. This may relate to scar, however not with certainty. The patient states she only noticed the lump within the past year. Due to the intramuscular location, additional imaging is recommended.  3.  Fat lobule in the left breast at 2 o'clock is benign.    Findings and recommendations were discussed with the patient at the time of the exam by Dr. Estill Hemming. In addition, the patient's referring provider, Pearline Bouillon, was notified of the exam results by Dr. Estill Hemming by secure text message [facility] Text) on 12/14/2022.    ACR BI-RADS Category 0 -  Incomplete: Need Additional Imaging Evaluation.    06-08-2021 Mammogram showed IMPRESSION:   Stable post-lumpectomy scar in the right breast is benign. Note that distortion from scarring can obscure underlying lesions.     Suggest this patient return  for her routine annual screening mammogram in 1 year.     ACR BI-RADS Category 2 -  Benign Finding.     02-25-2020 MRI right knee showed IMPRESSION:     1.  Attenuated posterior root of the lateral meniscus; query small tear.   2.  Focal low-grade chondromalacia with questionable chondral  delamination, measuring 0.6 cm in the weightbearing lateral femoral condyle.   3.  Small Baker's cyst.     12-10-2019 DEXA showed osteoporosis is at T score -2.8.     07-01-2019 MRI of breasts showed     Result Impression   IMPRESSION:    1. Right breast: BI-RADS Category 4 - Suspicious. Recommend US -guided core needle biopsy lumpectomy scar at 11:00 location given nodular plateau enhancement seen at the superior aspect of the scar. Findings and recommendations discussed with the patient on 07/01/19 at 12:30 pm.    2. Left breast: BI-RADS Category 1 - Negative.    OVERALL ASSESSMENT: BI-RADS Category 4.     05-26-2019 Mammogram showed   Result Impression     Area of distortion consistent with post surgical scarring in the left breast is benign. The findings and recommendations were discussed with the patient by Dr. Elza Handing at the conclusion of this examination.    Suggest this patient return for her routine annual screening mammogram in 1 year.    ACR BI-RADS Category 2 -  Benign Finding.     06-09-2018 Mammogram showed    Result Impression     Stable post lumpectomy scarring and radiation therapy change in the right breast is benign. Note that distortion from scarring can obscure underlying lesions.    Suggest this patient return for her routine annual screening mammogram in 1 year.    ACR BI-RADS Category 2 -  Benign Finding.     June 03, 2017 bone density shows osteopenia T-score of -2.4 in the left femoral neck.    06-03-2017 Mammogram showed Stable post lumpectomy scarring and radiation therapy change in the right breast is benign.    Suggest this patient return for her routine annual screening mammogram in 1 year.    ACR  BI-RADS Category 2 -  Benign Finding.    05/24/2016 bilateral diagnostic 2D and 3D mammogram shows stable post lumpectomy changes in the right breast are benign.  December 27, 2015 echocardiogram shows a normal ejection fraction.  05-24-2015 bilateral diagnostic mammogram and bilateral breast ultrasound showed new post lumpectomy scar in the right breast is benign, stable punctate calcifications in the area areolar region of the right breast are benign, stable punctate calcifications in the left breast at 6 o'clock are benign with no evidence of malignancy.  11/22/2014 bilateral 2D and 3D mammogram shows new punctate calcifications in the periareolar region of the right breast are probably benign in mammographic follow-up in 6 months is recommended, new post lumpectomy scar in the right breast is benign. New punctate calcifications in the left breast at 6 o'clock up probably benign.  11/22/2014 bone density shows osteoporosis with a T-score of-2.6 in the right femoral neck.  August 31, 2014 echocardiogram shows left ventricular diastolic dysfunction, normal left ventricular size and systolic function.    PATHOLOGY:    I reviewed the report with her:    07/07/2019 RIGHT BREAST, 11:00 AT 7 CM, CORE BIOPSY:  Fat necrosis and scar, consistent with prior procedure site changes.  No carcinoma identified.    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  09/27/2014 left thyroid nodule atypical follicular lesion of undetermined significance, a right thyroid nodule fine-needle aspiration negative for malignancy.    IMPRESSION/PLAN:    Problem # 1:  BREAST CANCER      Initial clinical stage IIA  pT2N0 right IDC s/p bx 01/14/14 (ER/PR negative, Her-2 positive by copy number = 6.6, Ki-67=50%). MRI breasts 01/29/14 showed lesion to be 2.2 x 1.6 x 2.0 cm. Staging PET/CT negative for distant  mets. Neoadjuvant chemotherapy with Her-2 targeted therapy was suggested, since tumor size > 2 cm, so recommended TCH + P (Taxotere, Carboplatin, Herceptin, Perjeta) q3wks x 6 cycles. Systemic therapies started 02/17/14 but pt developed severe diarrhea causing dehydration and electrolyte abnormalities requiring daily hydration and electrolyte replacements so changed regimen to weekly Taxol x 12 weeks with Herceptin/Perjeta q3wks and tolerated only slightly better. Right breast mass resolved on physical exam. Pt was becoming weaker due to ongoing diarrhea on Taxol so discontinued after 04/28/14 dose with plan for repeat breast imaging followed by surgery if response was good.  Breast imaging 04/28/14 (mammo, u/s) and MRI 04/30/14 showed minimal residual disease so Taxol was discontinued after 04/28/14 dose (week #8) and Herceptin/Perjeta continued. Right lumpectomy + SLNB 05/28/14 showed 6 mm residual disease, grade 2, SBR=7/9 (lower mitotic rate than bx), 0/1 LN; repeat ER/PR negative. Since only 5 doses of Perjeta were given pre-operatively and there was residual disease in surgical specimen, she was given the final 6th dose of Perjeta 06/02/14 with usual q3wk dose of Herceptin. Single-agent Herceptin continued q3wk and completed a 1-yr course in 12-2014 and since then no therapy. The radiation therapy was completed.  We reviewed the most recent findings in the right axilla on imaging and we ordered a breast MRI and reviewed it. She was advised to stay well hydrated since she will be given gadolinium.  A biopsy is not necessary at this time.  No new concerns on today's breast exam.  She remains off active therapy.  Continue to monitor.  All of her questions were answered to her satisfaction.    Problem # 2:  ACUTE DVT OF UPPER EXTREMITY      LUE acute DVT dx'ed by u/s 02/10/14 due to sudden onset of left arm swelling, completed anticoagulation and no evidence of recurrent thrombosis. May consider to remove PORT was concerned  about chance to develop DVT but advised that removing the PORT does not usually cause a DVT.    Problem # 3:  ABNORMAL ECHO      Baseline ECHO 02/10/14 with normal EF of 65-70% but with mild diastolic dysfunction. I advised her to continue  f/u with Dr. Radin for monitoring of cardiac function and recommended again a repeat ECHO and will retrieve the report from 2019.    Problem # 4: THYROID NODULE             She had a total thyroidectomy and no malignancy found.  Follow-up with Endocrinology or now PCP.    PROBLEM # 5 OSTEOPOROSIS    We discussed that she stopped Fosamax  for osteoporosis and we discussed Prolia  or Reclast.  She needs to obtain dental clearance and monitor for osteonecrosis of the jaw.  She needs to continue weight-bearing exercises, fall precautions, vitamin-D and calcium .  DEXA to be repeated every 2 years.    PROBLEM # 6 MELANOMA    To be followed by Dermatology and no new reported lesions.    Because she has anemia, and diabetes, and an elevated creatinine, she was advised to be followed by PCP and or Nephrology and continue to make an effort to stay well hydrated.  This is particularly important since she is considering intermittent imaging that requires contrast, that may affect the kidney.  Her creatinine is  slightly improved.    COVID-19 Vaccination   Guide for [employer] with Cancer    COVID-19 vaccination remains the most effective way to prevent SARS-CoV-2 infection and should be considered the first line of prevention. The COVID-19 Treatment Guidelines Panel (the Panel) recommends COVID-19 vaccination as soon as possible for everyone who is eligible, including patients with active cancer and patients receiving treatment for cancer.     Adapted from Cancer  COVID-19 Treatment Guidelines (http://www.myers.net/)      PLAN:    1.  Completed single-agent Herceptin q3wks to complete 1 yr total course (06/23/14 thru 01/19/15).   Repeat imaging was reviewed.  So far no evidence of recurrent disease.  Clinically  doing well we will continue to monitor.  2. The patient has evidence of iron -deficiency anemia and she is postmenopausal.  Apparently she had a colonoscopy.  She was advised to see her gastroenterologist and consider an upper GI endoscopy and capsule endoscopy to complete the GI workup.  3.  Osteoporosis and we discussed Prolia  or Reclast.  She was advised to discuss this with her dentist.  She obtain that the clearance.  Next DEXA planned for August of 2025.  4. Thyroidectomy done due to atypical left thyroid nodule and no malignancy, and continue to follow up with endocrinology.  5. We discussed again that as her creatinine worsens, her anemia may worsen as well.  She will address this with Nephrology.  For now no indication for Procrit.  6. History of malignant melanoma and followed by Dermatology and no new reported skin lesions    Orders Placed This Encounter   Procedures    X-RAY Dexa (Bone Density) Skeletal    CBC w/ Diff    Comprehensive Metabolic Panel    Parmele 27.29, Blood      Thank you very much for allowing our continued participation in the care of this patient.     Medical decision-making was highly complex  and more than 50% of this _40_ min visit was spent in educating the patient about their condition, discussing compliance issues, counseling including answering all of the patients questions and coordination of care. This included review of relevant laboratory, radiology and other diagnostic tests, explaining medical management choices and the patient verbalized understanding.                                                                                                                                                                                RTO:  6 months    I certify that I have reviewed the documentation contained in this clinical record and that it is accurately recorded.  This document contains private and confidential health information protected by state  and federal law and any release  of this information requires the written prior authorization of the above mentioned patient.    Please note this report was dictated with the use of voice recognition software.  It may contain inadvertent spelling or grammatical errors which were not detected during the editing process.       Take a virtual tour of our facility: [email or web address]          --------------------------------------  Pearline Bouillon, MD, MBA

## 2024-02-19 NOTE — Patient Instructions (Signed)
 Orders Placed This Encounter   Procedures    X-RAY Dexa (Bone Density) Skeletal    CBC w/ Diff    Comprehensive Metabolic Panel    [place] 27.29, Blood

## 2024-06-11 LAB — COMPREHENSIVE METABOLIC PANEL, BLOOD - ~~LOC~~
ALT (SGPT): 13 U/L (ref 6–29)
AST (SGOT): 17 U/L (ref 10–35)
Albumin/Glob Ratio: 1.8 (calc) (ref 1.0–2.5)
Albumin: 4.2 g/dL (ref 3.6–5.1)
Alkaline Phos: 89 U/L (ref 37–153)
BUN/Creatinine Ratio: 21 (calc) (ref 6–22)
BUN: 31 mg/dL — ABNORMAL HIGH (ref 7–25)
Bilirubin, Total: 1 mg/dL (ref 0.2–1.2)
Calcium: 9.1 mg/dL (ref 8.6–10.4)
Carbon Dioxide: 22 mmol/L (ref 20–32)
Chloride: 105 mmol/L (ref 98–110)
Creatinine: 1.45 mg/dL — ABNORMAL HIGH (ref 0.60–0.95)
EGFR: 36 mL/min/1.73m2 — ABNORMAL LOW (ref >=60)
Globulin: 2.3 g/dL (ref 1.9–3.7)
Glucose: 91 mg/dL (ref 65–139)
Potassium: 5 mmol/L (ref 3.5–5.3)
Sodium: 137 mmol/L (ref 135–146)
Total Protein: 6.5 g/dL (ref 6.1–8.1)

## 2024-06-11 LAB — CBC W/ DIFF, BLOOD - ~~LOC~~
Abs Basophils: 7 {cells}/uL (ref 0–200)
Abs Eosinophils: 7 {cells}/uL — ABNORMAL LOW (ref 15–500)
Abs Lymphs: 2618 {cells}/uL (ref 850–3900)
Abs Monocytes: 483 {cells}/uL (ref 200–950)
Abs Neutrophils: 3885 {cells}/uL (ref 1500–7800)
Basophils: 0.1 %
Eosinophils: 0.1 %
HCT: 37.9 % (ref 35.0–45.0)
HGB: 12.3 g/dL (ref 11.7–15.5)
Lymps: 37.4 %
MCH: 31.5 pg (ref 27.0–33.0)
MCHC: 32.5 g/dL (ref 32.0–36.0)
MCV: 96.9 fL (ref 80.0–100.0)
MPV: 10.1 fL (ref 7.5–12.5)
Monocytes: 6.9 %
PLT: 210 Thousand/uL (ref 140–400)
RBC: 3.91 Million/uL (ref 3.80–5.10)
RDW: 12.2 % (ref 11.0–15.0)
SEGS: 55.5 %
WBC: 7 Thousand/uL (ref 3.8–10.8)

## 2024-06-11 LAB — ~~LOC~~ 27.29, BLOOD - ~~LOC~~: ~~LOC~~ 27.29: 14 U/mL (ref <38)

## 2024-08-19 ENCOUNTER — Ambulatory Visit: Admitting: Hematology & Oncology

## 2024-08-19 ENCOUNTER — Ambulatory Visit: Payer: Self-pay

## 2024-08-19 ENCOUNTER — Other Ambulatory Visit
Admission: RE | Admit: 2024-08-19 | Discharge: 2024-08-19 | Disposition: A | Discharge status: Home / Self Care | Attending: Anatomic Pathology & Clinical Pathology | Admitting: Anatomic Pathology & Clinical Pathology

## 2024-08-19 ENCOUNTER — Encounter: Payer: Self-pay | Admitting: Hematology & Oncology

## 2024-08-19 VITALS — BP 131/78 | HR 75 | Temp 97.7°F | Resp 18 | Ht 65.0 in | Wt 179.6 lb

## 2024-08-19 DIAGNOSIS — Z1231 Encounter for screening mammogram for malignant neoplasm of breast: Secondary | ICD-10-CM

## 2024-08-19 DIAGNOSIS — C50411 Malignant neoplasm of upper-outer quadrant of right female breast: Secondary | ICD-10-CM | POA: Insufficient documentation

## 2024-08-19 DIAGNOSIS — C4361 Malignant melanoma of right upper limb, including shoulder: Secondary | ICD-10-CM

## 2024-08-19 DIAGNOSIS — Z171 Estrogen receptor negative status [ER-]: Secondary | ICD-10-CM

## 2024-08-19 DIAGNOSIS — Z780 Asymptomatic menopausal state: Secondary | ICD-10-CM | POA: Insufficient documentation

## 2024-08-19 DIAGNOSIS — M810 Age-related osteoporosis without current pathological fracture: Secondary | ICD-10-CM | POA: Insufficient documentation

## 2024-08-19 LAB — HEMOGRAM, BLOOD - ~~LOC~~
HCT: 38.9 % (ref 34.0–44.0)
HGB: 12.9 g/dL (ref 11.5–15.0)
MCH: 30.8 pg (ref 27.0–33.5)
MCHC: 33.1 g/dL (ref 32.0–35.5)
MCV: 93.1 fl (ref 81.5–97.0)
MPV: 7.2 fl (ref 7.2–11.7)
PLT Count: 259 thous/mcl (ref 150–400)
RBC: 4.18 mill/mcl (ref 3.70–5.00)
RDW-CV: 13.4 % (ref 11.6–14.4)
WBC: 6.7 thous/mcL (ref 4.0–10.5)

## 2024-08-19 LAB — COMPREHENSIVE METABOLIC PANEL, BLOOD - ~~LOC~~
ALT: 9 U/L (ref 7–52)
AST: 14 U/L (ref 13–39)
Albumin: 4.5 g/dL (ref 3.7–5.3)
Alkaline Phosphatase: 76 U/L (ref 34–104)
BUN: 27 mg/dL — ABNORMAL HIGH (ref 7–25)
Bilirubin, Total: 1 mg/dL (ref <=1.4)
Calcium: 8.9 mg/dL (ref 8.6–10.3)
Carbon Dioxide: 25 mmol/L (ref 21–31)
Chloride: 106 mmol/L (ref 98–107)
Creatinine: 1.5 mg/dL — ABNORMAL HIGH (ref 0.6–1.2)
Electrolyte Balance: 8 mmol/L (ref 2–12)
Glucose: 110 mg/dL (ref 85–125)
Potassium: 5 mmol/L (ref 3.5–5.1)
Protein, Total: 7.2 g/dL (ref 6.0–8.3)
Sodium: 139 mmol/L (ref 136–145)
eGFR: 35 mL/min/1.73 sq mtr — ABNORMAL LOW (ref >=60)

## 2024-08-19 LAB — DIFFERENTIAL - ~~LOC~~
Basophils %: 0.9 % (ref 0.0–2.0)
Basophils Absolute: 0.1 thous/mcl (ref 0.0–0.2)
Eosinophils %: 0.3 % (ref 0.0–7.0)
Eosinophils Absolute: 0 thous/mcl (ref 0.0–0.5)
Lymphocytes %: 37.9 % (ref 14.0–52.0)
Lymphocytes Absolute: 2.6 thous/mcl (ref 0.9–3.3)
Monocytes %: 7.8 % (ref 1.0–11.0)
Monocytes Absolute: 0.5 thous/mcl (ref 0.0–0.8)
Neutrophils %: 53.1 % (ref 39.0–88.0)
Neutrophils Absolute: 3.6 thous/mcl (ref 2.0–8.1)

## 2024-08-19 NOTE — Patient Instructions (Addendum)
 Orders Placed This Encounter   Procedures    Screening Mammogram With Digital Breast Tomosynthesis - Bilateral    CBC w/ Diff    Comprehensive Metabolic Panel    [place] 27.29, Blood    Vitamin D , 25-OH Total         Please ensure your labs are completed at least 1 to 2 weeks prior to your appointment.

## 2024-08-19 NOTE — Progress Notes (Signed)
 [name], MD, [facility]  Health Sciences Clinical Professor  Division of Hematology/Oncology, Department of Medicine  Medical Director, [facility]  [facility]  [facility]  [address], [address]  [place], [place] [address]  Tel: [phone]  Fax: [phone]     HEMATOLOGY ONCOLOGY FOLLOW UP NOTE     Date of Service:   08/19/2024     Kayla Joseph, DOB [birth year]    REFERRING MD: Elida Batter  Radiation oncology: Maude Door  PCP: Lynwood Barters  GYN: Montie Hipp   Cardiology: Ozell Boon    CHIEF COMPLAINT: Routine follow-up visit for right breast cancer and melanoma    HISTORY OF PRESENT ILLNESS:    78 y/o Caucasian woman with mammographically detected clinical stage IIA T2N0 right breast invasive ductal carcinoma s/p bx  01/14/14 (SBR 9/9, ER/PR negative, Ki-67=50%, Her-2/neu FISH ratio=1.14 but copy number=6.6 so positive). MRI breast 01/29/14 showed mass to be 2.2 x 1.6 x 2.0 cm. Staging PET/CT 01/27/14 was negative for distant mets; showed only known right breast tumor and incidental left thyroid nodule. Planned neoadjuvant systemic therapy: TCH + P x 6 cycles followed by surgery and radiation therapy. Cycle #1 TCH+P given 02/17/14. Pt developed severe diarrhea and dehydration causing electrolyte derangements requiring daily replacement therapies and hydration.  Therefore, chemotherapy was changed to weekly Taxol for 12 weeks and Herceptin plus Perjeta every 3 weeks was continued. Taxol started 03/10/14 and completed only 8 weeks on 04/28/14 due to cumulative severe side effects and MRI breast 04/30/14 showing only 4mm of residual enhancement. Herceptin/Perjeta continued q3wks for 6 total cycles. Right lumpectomy + SLNB done 05/28/14 (6 mm residual IDC, grade 2, SBR 7/9, no LI, 0/1 LN; ypT1bN0; residual tumor had lower a mitotic score compared to initial biopsy specimen likely due to chemotherapy effect;  repeat ER and PR on residual tumor was again negative for both). Due to residual disease although minimal, pt received final dose (#6)  of Perjeta 06/02/14. Herceptin single-agent q3wks started 06/23/14.     July 14, 2014 (Herceptin week #22)     Started single-agent Herceptin 06/23/14 and tolerated it well without side effects. Hair is starting to grow.  Neuropathy in the fingers and bottoms of the feet continue but improve when she takes gabapentin  300 mg 3 times a day.  She does not want to increase the dose since it is controlling her neuropathy and she does not want to increase fatigue due to medication side effect.  She just returned from her trip to [place]. Diarrhea continues but it was improved with more solid form during her [place] trip.  The only difference during that trip was that her friends were cooking all of her meals for her so a change in diet seems to have improved the diarrhea.  Since her return, her diarrhea has resumed unchanged.  She has about 6 watery episodes of diarrhea per day and has had 3 episodes thus far this morning.  She continues Percocet as needed but also has a prior prescription for more tincture of opium which she will change back to since postop pain has resolved.  She has not yet done repeat stool studies since she was on her trip but will do so soon with prior order.  She has not seen the gastroenterologist yet but will ask her primary care doctor for a referral to see one soon.  Dysuria symptoms recurred this past  weekend.  Bactrim worked well in the past after the 1st dose. She saw Dr Maude Door 06/25/14 and will start xrt next week on 07/21/14.  She receives hydration every 3 weeks with Herceptin but requests more frequent visits since her diarrhea is ongoing. Labs (CBC, CMP) 06/02/14 sig for H/H=10.6/32.9; gluc=221.     The patient completed the Herceptin every 3 weeks for total of 1 year of adjuvant therapy in 12-2014 and remains off therapy.     INTERVAL  HISTORY:    The patient is here to review the repeat imaging.  She continues to have discomfort in the right axilla.  She was recommended to have a breast MRI.  She is followed by Dr. Charlie Appl of GI and has completed colonoscopy but does not recall doing an upper GI endoscopy and capsule endoscopy.  She denies any visible bleeding.  She has been taking B12 but an interrupted it was found to be low on B12 again and now has resumed the oral B12 intake at 1000 mcg daily.  She stopped the Fosamax  and is asking for a medication that is stronger.  She is followed by the dentist.  She is aware for borderline elevated creatinine in his monitored by PCP and is trying to stay well hydrated.    02-12-2023 Patient obtained dental clearance and may proceed to Prolia .   Patient denies any new breast related problems.    08-15-2023 The patient is here to review the lab work.  She is asking for repeat orders and denies any new breast related issues.  She has not seen Nephrology.  Denies any new medication changes.    02-19-2024 She denies any new breast related issues.  She is waiting to see Nephrology because the increase in the creatinine.  Denies any new medications.   She no longer takes the lisinopril .    08-19-2024 The patient has no new breast related concerns.  A repeat mammo is due for April of 2026.  She is asking for orders.  She denies any new medications.  She is here also to review the DEXA.    11-20-2019 1st vaccine COVID19 Pfizer at [facility]  12-11-2019 2nd vaccine COVID19 Pfizer at [facility]        PAST MEDICAL HISTORY:      Reviewed and no changes.    Stage IIA T2N0 right breast IDC --> ypT1bN0   LUE DVT dx'ed by U/S 02/10/14 -- Lovenox started same day; resolved on 05/18/14 U/S; completed Lovenox 06/07/14   Stage I pT1aN0 melanoma of upper back s/p excision  DM II  Hyperlipidemia  Hypertension  BCC skin  Thyroid nodules bilaterally   She had a colonoscopy in 2018 and to be repeated in 2023.               PAST SURGICAL  HISTORY:      Reviewed and no changes.    05/28/14        Right lumpectomy + SLNB   02/05/14      L. chest port placement ([facility])  07/04/11      Wide excision upper back melanoma + R. supraclavicular SLNB (lentigo melanoma and melanoma in situ, 0.75 mm, no residual invasive melanoma, 0/1 LN)   2013         Facelift  2012         Gastric bypass  1999         Cholecystectomy  1995         Left  foot fracture repair with pins  Age 70      Tonsillectomy        PAST OB/GYN HISTORY:      Reviewed and no changes.      G0P0. Menarche at 13. OCP x 15 yrs; last age 79s. HRT late 56s for <20mths; stopped after [facility] study results reported. LMP age 67.        HEALTH MAINTENANCE:    Colo -- 3-4 yrs ago; repeat due next year   DXA -- 2015 osteopenia; prescribed Fosamax  but never started   Pap -- 11/2013 (one prior abnl pap and polyp removed)        FAMILY HISTORY:    Reviewed and no changes.    Mother -- died 62 of CHF  Father -- died 60 of decline after stroke  Sister -- died 60 of NHL; another sister alive and well     SOCIAL HISTORY:                Reviewed and no changes    Lives with partner, Dempsey Don.   Kids -- none   Retired theme park manager.   Tobacco -- only 4 yrs in early 80s,  <1ppd  Alcohol - 1 glass wine/mth at most   Drugs - none          CURRENT MEDS:    I reviewed the medication list.      ALLERGIES:     She requested the walnut allergy to be removed and reports no known drug allergies.    REVIEW OF SYSTEMS (ROS):    A comprehensive 15-point ROS was performed and reviewed with the patient and is negative unless noted above.       EXAM:        02/12/2023     8:33 AM 08/15/2023     8:41 AM 08/15/2023     8:42 AM 02/19/2024     9:12 AM 02/19/2024     9:14 AM 08/19/2024     9:41 AM 08/19/2024     9:43 AM   Vitals   Taken/Reported  In Clinic  In Clinic  In Clinic    Systolic  139  128  131    Diastolic  81  82  78    Position  Sitting  Sitting  Sitting    Site  Left arm   Left arm   Left arm    Cuff Size  Regular    Regular   Regular    Pulse  77  69  75    Resp  16  18  18     Temp  97.9 F (36.6 C)  98.1 F (36.7 C)  97.7 F (36.5 C)    Temp Source  Temporal  Temporal  Temporal    SpO2  100 %  100 %  98 %    Weight (kg)  83.915 kg  82.101 kg  81.45 kg    Weight (lbs)  185 lb  181 lb  179 lb 9 oz    Height (cm)  165.1 cm  165.1 cm  165.1 cm    Height (ft in)  5' 5  5' 5  5' 5    BMI (kg/m2)  30.79 kg/m2  30.12 kg/m2  29.88 kg/m2    BSA (m2)  1.96 m2  1.94 m2  1.93 m2    Fall Risk No fall risk identified  No fall risk identified  No fall risk identified  Interventions     Oriented to room  Oriented to room       Data saved with a previous flowsheet row definition      GENERAL: The patient is well developed and well nourished, ambulatory, in no acute distress.  HEENT: normocephalic/atraumatic, anicteric sclera.  HEART: RRR. No murmurs.  CHEST:  CTA bilaterally.  BREASTS:  Bilaterally examined, status post right breast lumpectomy and no new concerning findings.  ABDOMEN: soft, non-tender, non-distended, normal bowel sounds, no rebound or guarding, no obvious masses.  EXTREMITIES: Trace edema of the lower extremities. Appears unchanged.   SKIN:  No rash or jaundice.  NEURO: AOx3,  no focal deficits.   PSYCHIATRIC:  Good insight adequate mood and affect.    LAB DATA:    Pending.     Latest Reference Range & Units 12/14/20 09:25 06/16/21 08:21 06/06/22 10:44 06/06/22 10:46 12/18/22 08:14 02/04/23 07:30 08/09/23 09:52 02/17/24 11:17 06/10/24 13:00   Sodium 135 - 146 mmol/L 134 (L) 136 136  136 138 138 134 (L) 137   Potassium 3.5 - 5.3 mmol/L 5.2 5.0 4.8  4.3 4.9 5.9 (H) 5.6 (H) 5.0   Chloride 98 - 110 mmol/L 105 105 107  106 107 109 102 105   Carbon Dioxide 20 - 32 mmol/L 19 (L) 23 19 (L)  20 23 19  (L) 23 22   BUN 7 - 25 mg/dL 25 25 31  (H)  28 (H) 31 (H) 27 (H) 28 (H) 31 (H)   Creatinine 0.60 - 0.95 mg/dL 8.80 (H) 8.71 (H) 8.62 (H)  1.40 (H) 1.29 (H) 1.21 (H) 1.24 (H) 1.45 (H)   eGFR non-Afr.American > OR = 60 mL/min/1.65m2  44 (L)           eGFR African American > OR = 60 mL/min/1.26m2 51 (L)           Glucose 65 - 139 mg/dL 873 (H) 880 (H) 837 (H)  92 108 (H) 91 106 (H) 91   Calcium  8.6 - 10.4 mg/dL 8.9 8.8 8.2 (L)  8.7 8.8 9.0 9.2 9.1   Total Protein 6.1 - 8.1 g/dL 6.6 6.5 6.4  6.8 6.4 6.4 6.5 6.5   Alkaline Phos 37 - 153 U/L 96 102 103  96 78 77 86 89   AST (SGOT) 10 - 35 U/L 12 12 15  13 13 15 12 17    ALT (SGPT) 6 - 29 U/L 7 6 9  7 9 9 9 13    Albumin 3.6 - 5.1 g/dL 4.2 4.0 4.0  4.3 4.1 4.1 4.3 4.2   Bilirubin, Total 0.2 - 1.2 mg/dL 1.3 (H) 1.0 0.7  0.8 1.2 1.1 1.3 (H) 1.0   BUN/Creatinine Ratio 6 - 22 (calc) 21 20 23  (H)  20 24 (H) 22 23 (H) 21   [place]absco 27.29 <38 U/mL 15 15 10   <10 <10 <10 <10 14   Ferritin 16 - 288 ng/mL   7 (L)  4 (L) 15 (L) 25     Globulin 1.9 - 3.7 g/dL (calc) 2.4 2.5 2.4  2.5 2.3 2.3 2.2 2.3   Albumin/Glob Ratio 1.0 - 2.5 (calc) 1.8 1.6 1.7  1.7 1.8 1.8 2.0 1.8   Iron  45 - 160 mcg/dL     60 845 859     IIBC 250 - 450 mcg/dL (calc)     513 (H) 582 372     Iron  Saturation 16 - 45 % (calc)     12 (L) 37 38  Vitamin B12 200 - 1,100 pg/mL   240  193 (L) 281 657     Vitamin D , 25-OH, Total 30 - 100 ng/mL 37 51  34        WBC 3.8 - 10.8 Thousand/uL 5.6 7.0 5.6   4.7 5.4 6.6 7.0   RBC 3.80 - 5.10 Million/uL 3.71 (L) 3.65 (L) 3.35 (L)   3.47 (L) 3.53 (L) 3.78 (L) 3.91   HGB 11.7 - 15.5 g/dL 88.8 (L) 88.6 (L) 9.9 (L)   10.4 (L) 11.0 (L) 11.6 (L) 12.3   HCT 35.0 - 45.0 % 33.8 (L) 34.4 (L) 30.3 (L)   32.1 (L) 33.8 (L) 35.7 37.9   MCV 80.0 - 100.0 fL 91.1 94.2 90.4   92.5 95.8 94.4 96.9   MCH 27.0 - 33.0 pg 29.9 31.0 29.6   30.0 31.2 30.7 31.5   MCHC 32.0 - 36.0 g/dL 67.1 67.1 67.2   67.5 67.4 32.5 32.5   RDW 11.0 - 15.0 % 12.0 11.8 12.3   13.2 12.0 11.8 12.2   PLT 140 - 400 Thousand/uL 234 195 172   182 165 194 210   MPV 7.5 - 12.5 fL 10.3 9.9 10.0   10.3 9.9 9.8 10.1   SEGS % 61.6 59.3 60.6   44.7 60.1 58.3 55.5   Lymps % 30.1 31.0 32.4   45.6 32.8 34.4 37.4   Monocytes % 7.7 9.0 6.6   9.1 6.5 6.3 6.9   Eosinophils % 0.4  0.4 0.2   0.4 0.4 0.8 0.1   Basophils % 0.2 0.3 0.2   0.2 0.2 0.2 0.1   BANDS % CANCELED CANCELED NOT AVAILABLE   NOT AVAILABLE NOT AVAILABLE     Metamyelocytes % CANCELED CANCELED NOT AVAILABLE   NOT AVAILABLE NOT AVAILABLE     Myelocytes % CANCELED CANCELED NOT AVAILABLE   NOT AVAILABLE NOT AVAILABLE     Promyelocytes % CANCELED CANCELED NOT AVAILABLE   NOT AVAILABLE NOT AVAILABLE     Blasts % CANCELED CANCELED NOT AVAILABLE   NOT AVAILABLE NOT AVAILABLE     Reactive Lymphs 0 - 10 % CANCELED CANCELED NOT AVAILABLE   NOT AVAILABLE NOT AVAILABLE     NRBC 0 /100 WBC CANCELED CANCELED NOT AVAILABLE   NOT AVAILABLE NOT AVAILABLE     Abs Neutrophils 1,500 - 7,800 cells/uL 3,450 4,151 3,394   2,101 3,245 3,848 3,885   Abs Lymphs 850 - 3,900 cells/uL 1,686 2,170 1,814   2,143 1,771 2,270 2,618   Abs Monocytes 200 - 950 cells/uL 431 630 370   428 351 416 483   Abs Eosinophils 15 - 500 cells/uL 22 28 11  (L)   19 22 53 7 (L)   Abs Basophils 0 - 200 cells/uL 11 21 11   9 11 13 7    Abs Band Neutrophils 0 - 750 cells/uL CANCELED CANCELED NOT AVAILABLE   NOT AVAILABLE NOT AVAILABLE     Abs Metamyelocytes 0 cells/uL CANCELED CANCELED NOT AVAILABLE   NOT AVAILABLE NOT AVAILABLE     Abs Myelocytes 0 cells/uL CANCELED CANCELED NOT AVAILABLE   NOT AVAILABLE NOT AVAILABLE     Abs Promyelocytes 0 cells/uL CANCELED CANCELED NOT AVAILABLE   NOT AVAILABLE NOT AVAILABLE     Abs Blasts 0 cells/uL CANCELED CANCELED NOT AVAILABLE   NOT AVAILABLE NOT AVAILABLE     Abs NRBC 0 cells/uL CANCELED CANCELED NOT AVAILABLE   NOT AVAILABLE NOT AVAILABLE     Comments  CANCELED CANCELED  NOT AVAILABLE   NOT AVAILABLE NOT AVAILABLE     Retic %, Auto %   1.1  1.0 1.6 1.4     Retic Absolute 20,000 - 80,000 cells/uL   36,850  33,800 55,520 49,420       IMAGING DATA:    Reviewed the imaging with patient:    07-06-2024 DEXA showed Bone Density:   -----------------------------------------------------------------   Region                  BMD    T-score  Z-score    Classification   -----------------------------------------------------------------   AP Spine(L1-L4)          0.902   -1.3      1.4       Osteopenia   Femoral Neck (Left)      0.495   -3.2     -0.9       Osteoporosis   Total Hip (Left)         0.640   -2.5     -0.4       Osteoporosis   Femoral Neck (Right)     0.530   -2.9     -0.6       Osteoporosis   Total Hip (Right)        0.664   -2.3     -0.2       Osteopenia   -----------------------------------------------------------------     World Health Organization criteria for BMD impression   classify patients as:   Normal (T-score at or above -1.0),   Osteopenia (T-score between -1.0 and -2.5), or   Osteoporosis (T-score at or below -2.5).     02/06/2024 Mammogram showed Impression    Stable post-lumpectomy scar in the right breast is benign.    Suggest this patient return for her routine annual screening mammogram in 1 year.    ACR BI-RADS Category 2 -  Benign Finding.    Life-time breast cancer risk (Modified v8 Tyrer-Cuzick/IBIS): Not applicable. The accuracy of the IBIS risk model is limited in individuals with a personal history of breast cancer.    01-12-2023 Breast MRI showed Impression    1. Right breast: BI-RADS Category 2 - Benign. Status post lumpectomy. No MR evidence of malignancy. Clinical follow-up is recommended for the palpable area in the right axilla.  2. Left breast: BI-RADS Category 1 - Negative. No MR evidence of malignancy.  3.  Recommend routine annual mammography in 11 months.    OVERALL ASSESSMENT: BI-RADS Category 2.    12-14-2022 Breast mammogram and US  showed Impression    1.  Stable post-surgical scar in the right breast is benign. Note that distortion from scarring can obscure underlying lesions.  2.  Vague hypoechoic area in the right axilla requires additional evaluation. We will recall the patient for an MRI exam. This may relate to scar, however not with certainty. The patient states she only noticed the lump within the past year.  Due to the intramuscular location, additional imaging is recommended.  3.  Fat lobule in the left breast at 2 o'clock is benign.    Findings and recommendations were discussed with the patient at the time of the exam by Dr. maple larrie Novas. In addition, the patient's referring provider, Francies Falco, was notified of the exam results by Dr. maple larrie Novas by secure text message [facility] Text) on 12/14/2022.    ACR BI-RADS Category 0 -  Incomplete: Need Additional Imaging Evaluation.    06-08-2021 Mammogram showed  IMPRESSION:   Stable post-lumpectomy scar in the right breast is benign. Note that distortion from scarring can obscure underlying lesions.     Suggest this patient return for her routine annual screening mammogram in 1 year.     ACR BI-RADS Category 2 -  Benign Finding.     02-25-2020 MRI right knee showed IMPRESSION:     1.  Attenuated posterior root of the lateral meniscus; query small tear.   2.  Focal low-grade chondromalacia with questionable chondral delamination, measuring 0.6 cm in the weightbearing lateral femoral condyle.   3.  Small Baker's cyst.     12-10-2019 DEXA showed osteoporosis is at T score -2.8.     07-01-2019 MRI of breasts showed     Result Impression   IMPRESSION:    1. Right breast: BI-RADS Category 4 - Suspicious. Recommend US -guided core needle biopsy lumpectomy scar at 11:00 location given nodular plateau enhancement seen at the superior aspect of the scar. Findings and recommendations discussed with the patient on 07/01/19 at 12:30 pm.    2. Left breast: BI-RADS Category 1 - Negative.    OVERALL ASSESSMENT: BI-RADS Category 4.     05-26-2019 Mammogram showed   Result Impression     Area of distortion consistent with post surgical scarring in the left breast is benign. The findings and recommendations were discussed with the patient by Dr. Daralyn at the conclusion of this examination.    Suggest this patient return for her routine annual screening mammogram in 1 year.    ACR  BI-RADS Category 2 -  Benign Finding.     06-09-2018 Mammogram showed    Result Impression     Stable post lumpectomy scarring and radiation therapy change in the right breast is benign. Note that distortion from scarring can obscure underlying lesions.    Suggest this patient return for her routine annual screening mammogram in 1 year.    ACR BI-RADS Category 2 -  Benign Finding.     June 03, 2017 bone density shows osteopenia T-score of -2.4 in the left femoral neck.    06-03-2017 Mammogram showed Stable post lumpectomy scarring and radiation therapy change in the right breast is benign.    Suggest this patient return for her routine annual screening mammogram in 1 year.    ACR BI-RADS Category 2 -  Benign Finding.    05/24/2016 bilateral diagnostic 2D and 3D mammogram shows stable post lumpectomy changes in the right breast are benign.  December 27, 2015 echocardiogram shows a normal ejection fraction.  05-24-2015 bilateral diagnostic mammogram and bilateral breast ultrasound showed new post lumpectomy scar in the right breast is benign, stable punctate calcifications in the area areolar region of the right breast are benign, stable punctate calcifications in the left breast at 6 o'clock are benign with no evidence of malignancy.  11/22/2014 bilateral 2D and 3D mammogram shows new punctate calcifications in the periareolar region of the right breast are probably benign in mammographic follow-up in 6 months is recommended, new post lumpectomy scar in the right breast is benign. New punctate calcifications in the left breast at 6 o'clock up probably benign.  11/22/2014 bone density shows osteoporosis with a T-score of-2.6 in the right femoral neck.  August 31, 2014 echocardiogram shows left ventricular diastolic dysfunction, normal left ventricular size and systolic function.    PATHOLOGY:    I reviewed the report with her:    07/07/2019 RIGHT BREAST, 11:00 AT 7 CM, CORE BIOPSY:  Fat necrosis  and scar, consistent  with prior procedure site changes.  No carcinoma identified.    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  09/27/2014 left thyroid nodule atypical follicular lesion of undetermined significance, a right thyroid nodule fine-needle aspiration negative for malignancy.    IMPRESSION/PLAN:    Problem # 1:  BREAST CANCER      Initial clinical stage IIA pT2N0 right IDC s/p bx 01/14/14 (ER/PR negative, Her-2 positive by copy number = 6.6, Ki-67=50%). MRI breasts 01/29/14 showed lesion to be 2.2 x 1.6 x 2.0 cm. Staging PET/CT negative for distant mets. Neoadjuvant chemotherapy with Her-2 targeted therapy was suggested, since tumor size > 2 cm, so recommended TCH + P (Taxotere, Carboplatin, Herceptin, Perjeta) q3wks x 6 cycles. Systemic therapies started 02/17/14 but pt developed severe diarrhea causing dehydration and electrolyte abnormalities requiring daily hydration and electrolyte replacements so changed regimen to weekly Taxol x 12 weeks with Herceptin/Perjeta q3wks and tolerated only slightly better. Right breast mass resolved on physical exam. Pt was becoming weaker due to ongoing diarrhea on Taxol so discontinued after 04/28/14 dose with plan for repeat breast imaging followed by surgery if response was good.  Breast imaging 04/28/14 (mammo, u/s) and MRI 04/30/14 showed minimal residual disease so Taxol was discontinued after 04/28/14 dose (week #8) and Herceptin/Perjeta continued. Right lumpectomy + SLNB 05/28/14 showed 6 mm residual disease, grade 2, SBR=7/9 (lower mitotic rate than bx), 0/1 LN; repeat ER/PR negative. Since only 5 doses of Perjeta were given pre-operatively and there was residual disease in surgical specimen, she was given the final 6th dose of Perjeta 06/02/14 with usual q3wk dose of Herceptin. Single-agent Herceptin continued q3wk and completed a 1-yr course in  12-2014 and since then no therapy. The radiation therapy was completed.  We reviewed the most recent findings in the right axilla on imaging and we ordered a breast MRI and reviewed it. She was advised to stay well hydrated since she will be given gadolinium.  A biopsy is not necessary at this time.  She continues to remain off active therapy.  We will continue to monitor and repeat imaging was ordered.    Problem # 2:  ACUTE DVT OF UPPER EXTREMITY      LUE acute DVT dx'ed by u/s 02/10/14 due to sudden onset of left arm swelling, completed anticoagulation and no evidence of recurrent thrombosis. May consider to remove PORT was concerned about chance to develop DVT but advised that removing the PORT does not usually cause a DVT.    Problem # 3:  ABNORMAL ECHO      Baseline ECHO 02/10/14 with normal EF of 65-70% but with mild diastolic dysfunction. I advised her to continue  f/u with Dr. Radin for monitoring of cardiac function and recommended again a repeat ECHO and will retrieve the report from 2019.    Problem # 4: THYROID NODULE             She had a total thyroidectomy and no malignancy found.  Follow-up with Endocrinology or now PCP.    PROBLEM # 5 OSTEOPOROSIS    We discussed that she stopped Fosamax  for osteoporosis and we discussed Prolia  or Reclast.  She needs to obtain dental clearance and monitor for osteonecrosis of the jaw.  She needs to continue weight-bearing exercises, fall precautions, vitamin-D and calcium .  DEXA to be repeated every 2 years.  PROBLEM # 6 MELANOMA    To be followed by Dermatology and no new reported lesions.    Because she has anemia, and diabetes, and an elevated creatinine, she was advised to be followed by PCP and or Nephrology and continue to make an effort to stay well hydrated.  This is particularly important since she is considering intermittent imaging that requires contrast, that may affect the kidney.  Her creatinine is slightly improved.    COVID-19 Vaccination   Guide  for [employer] with Cancer    COVID-19 vaccination remains the most effective way to prevent SARS-CoV-2 infection and should be considered the first line of prevention. The COVID-19 Treatment Guidelines Panel (the Panel) recommends COVID-19 vaccination as soon as possible for everyone who is eligible, including patients with active cancer and patients receiving treatment for cancer.     Adapted from Cancer  COVID-19 Treatment Guidelines ([email or web address]      PLAN:    1.  Completed single-agent Herceptin q3wks to complete 1 yr total course (06/23/14 thru 01/19/15).  No new concerns on today's exam and repeat mammogram is due for April of 2026.  2. The patient has evidence of iron -deficiency anemia and she is postmenopausal.  Apparently she had a colonoscopy.  She was advised to see her gastroenterologist and consider an upper GI endoscopy and capsule endoscopy to complete the GI workup.  Repeat blood work was ordered.  3.  Osteoporosis and we discussed Prolia  or Reclast.  She was advised to discuss this with her dentist.  She needs to obtain dental clearance.  DEXA was reviewed and she has progression of osteoporosis.  We discussed to proceed to Prolia  and she had obtained in the past clearance.  Risks and benefits were reviewed.  Needs to monitor for ONJ with a dentist.  4. 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  5. We discussed again that as her creatinine worsens, her anemia may worsen as well.  She will address this with Nephrology.  For now no indication for Procrit.  6. History of malignant melanoma and followed by Dermatology and no new reported problems.    Orders Placed This Encounter   Procedures    Screening Mammogram With Digital Breast Tomosynthesis - Bilateral    CBC w/ Diff    Comprehensive Metabolic Panel    Tonica 27.29, Blood    Vitamin D , 25-OH Total      Thank you very much for allowing our continued participation in the care of  this patient.     Medical decision-making was highly complex  and more than 50% of this _40_ min visit was spent in educating the patient about their condition, discussing compliance issues, counseling including answering all of the patients questions and coordination of care. This included review of relevant laboratory, radiology and other diagnostic tests, explaining medical management choices and the patient verbalized understanding.  RTO:  6 months    I certify that I have reviewed the documentation contained in this clinical record and that it is accurately recorded.  This document contains private and confidential health information protected by state and federal law and any release of this information requires the written prior authorization of the above mentioned patient.    Please note this report was dictated with the use of voice recognition software.  It may contain inadvertent spelling or grammatical errors which were not detected during the editing process.       Take a virtual tour of our facility: [email or web address]          --------------------------------------  Francies Falco, MD, MBA

## 2024-08-21 ENCOUNTER — Ambulatory Visit

## 2024-08-21 LAB — ~~LOC~~ 27.29, BLOOD - ~~LOC~~: ~~LOC~~ 27.29 Res: 18.4 U/mL (ref <=39.0)
